# Patient Record
Sex: Male | Born: 1980 | Race: Black or African American | Hispanic: No | Marital: Single | Smoking: Current every day smoker
Health system: Southern US, Community
[De-identification: ages and names within clinical notes are randomized; demographics above are authoritative.]

## PROBLEM LIST (undated history)

## (undated) DIAGNOSIS — E43 Unspecified severe protein-calorie malnutrition: Secondary | ICD-10-CM

## (undated) DIAGNOSIS — F1010 Alcohol abuse, uncomplicated: Secondary | ICD-10-CM

## (undated) DIAGNOSIS — K760 Fatty (change of) liver, not elsewhere classified: Secondary | ICD-10-CM

## (undated) DIAGNOSIS — K5090 Crohn's disease, unspecified, without complications: Secondary | ICD-10-CM

## (undated) DIAGNOSIS — D509 Iron deficiency anemia, unspecified: Secondary | ICD-10-CM

## (undated) DIAGNOSIS — K626 Ulcer of anus and rectum: Secondary | ICD-10-CM

## (undated) DIAGNOSIS — R569 Unspecified convulsions: Secondary | ICD-10-CM

## (undated) DIAGNOSIS — Z720 Tobacco use: Secondary | ICD-10-CM

## (undated) HISTORY — PX: COLOSTOMY REVERSAL: SHX5782

## (undated) HISTORY — PX: PARTIAL COLECTOMY: SHX5273

## (undated) HISTORY — DX: Ulcer of anus and rectum: K62.6

## (undated) HISTORY — DX: Unspecified convulsions: R56.9

## (undated) HISTORY — DX: Unspecified severe protein-calorie malnutrition: E43

## (undated) HISTORY — DX: Alcohol abuse, uncomplicated: F10.10

## (undated) HISTORY — DX: Iron deficiency anemia, unspecified: D50.9

## (undated) HISTORY — PX: HERNIA REPAIR: SHX51

## (undated) HISTORY — PX: COLOSTOMY: SHX63

## (undated) HISTORY — DX: Fatty (change of) liver, not elsewhere classified: K76.0

## (undated) HISTORY — DX: Tobacco use: Z72.0

---

## 2012-05-15 ENCOUNTER — Encounter (HOSPITAL_COMMUNITY): Payer: Self-pay | Admitting: Radiology

## 2012-05-15 ENCOUNTER — Emergency Department (HOSPITAL_COMMUNITY)
Admission: EM | Admit: 2012-05-15 | Discharge: 2012-05-15 | Disposition: A | Discharge status: Home / Self Care | Payer: Self-pay | Attending: Emergency Medicine | Admitting: Emergency Medicine

## 2012-05-15 DIAGNOSIS — R630 Anorexia: Secondary | ICD-10-CM | POA: Insufficient documentation

## 2012-05-15 DIAGNOSIS — R197 Diarrhea, unspecified: Secondary | ICD-10-CM | POA: Insufficient documentation

## 2012-05-15 DIAGNOSIS — K5090 Crohn's disease, unspecified, without complications: Secondary | ICD-10-CM | POA: Insufficient documentation

## 2012-05-15 DIAGNOSIS — F17200 Nicotine dependence, unspecified, uncomplicated: Secondary | ICD-10-CM | POA: Insufficient documentation

## 2012-05-15 DIAGNOSIS — Z760 Encounter for issue of repeat prescription: Secondary | ICD-10-CM | POA: Insufficient documentation

## 2012-05-15 DIAGNOSIS — R110 Nausea: Secondary | ICD-10-CM | POA: Insufficient documentation

## 2012-05-15 DIAGNOSIS — Z9889 Other specified postprocedural states: Secondary | ICD-10-CM | POA: Insufficient documentation

## 2012-05-15 DIAGNOSIS — Z933 Colostomy status: Secondary | ICD-10-CM | POA: Insufficient documentation

## 2012-05-15 LAB — COMPREHENSIVE METABOLIC PANEL
ALT: 9 U/L (ref 0–53)
AST: 17 U/L (ref 0–37)
Albumin: 3.7 g/dL (ref 3.5–5.2)
Alkaline Phosphatase: 108 U/L (ref 39–117)
CO2: 26 mEq/L (ref 19–32)
Chloride: 100 mEq/L (ref 96–112)
Creatinine, Ser: 0.82 mg/dL (ref 0.50–1.35)
GFR calc non Af Amer: >90 mL/min (ref >90)
Potassium: 3.8 mEq/L (ref 3.5–5.1)
Sodium: 136 mEq/L (ref 135–145)
Total Bilirubin: 0.3 mg/dL (ref 0.3–1.2)

## 2012-05-15 LAB — CBC WITH DIFFERENTIAL/PLATELET
Basophils Absolute: 0.1 10*3/uL (ref 0.0–0.1)
Basophils Relative: 1 % (ref 0–1)
HCT: 44.5 % (ref 39.0–52.0)
Hemoglobin: 15.7 g/dL (ref 13.0–17.0)
Lymphocytes Relative: 13 % (ref 12–46)
MCHC: 35.3 g/dL (ref 30.0–36.0)
Monocytes Absolute: 1 10*3/uL (ref 0.1–1.0)
Neutro Abs: 8.1 10*3/uL — ABNORMAL HIGH (ref 1.7–7.7)
Neutrophils Relative %: 76 % (ref 43–77)
RDW: 15.2 % (ref 11.5–15.5)
WBC: 10.7 10*3/uL — ABNORMAL HIGH (ref 4.0–10.5)

## 2012-05-15 MED ORDER — AZATHIOPRINE 50 MG PO TABS: 50.0000 mg | ORAL_TABLET | Freq: Every day | ORAL | Status: DC

## 2012-05-15 MED ORDER — ONDANSETRON HCL 4 MG PO TABS: 4.0000 mg | ORAL_TABLET | Freq: Four times a day (QID) | ORAL | Status: DC

## 2012-05-15 MED ORDER — ONDANSETRON HCL 4 MG/2ML IJ SOLN
4.0000 mg | Freq: Once | INTRAMUSCULAR | Status: AC
  Administered 2012-05-15: 4 mg via INTRAVENOUS
  Filled 2012-05-15: qty 2

## 2012-05-15 MED ORDER — AZATHIOPRINE 50 MG PO TABS
50.0000 mg | ORAL_TABLET | Freq: Once | ORAL | Status: AC
  Administered 2012-05-15: 50 mg via ORAL
  Filled 2012-05-15 (×2): qty 1

## 2012-05-15 MED ORDER — HYDROMORPHONE HCL PF 1 MG/ML IJ SOLN
1.0000 mg | Freq: Once | INTRAMUSCULAR | Status: AC
  Administered 2012-05-15: 1 mg via INTRAVENOUS
  Filled 2012-05-15: qty 1

## 2012-05-15 MED ORDER — SODIUM CHLORIDE 0.9 % IV BOLUS (SEPSIS)
1000.0000 mL | Freq: Once | INTRAVENOUS | Status: AC
  Administered 2012-05-15: 1000 mL via INTRAVENOUS

## 2012-05-15 MED ORDER — OXYCODONE-ACETAMINOPHEN 5-325 MG PO TABS: 2.0000 | ORAL_TABLET | ORAL | Status: DC | PRN

## 2012-05-15 NOTE — Discharge Instructions (Signed)
Crohn's Disease Crohn's disease is a long-term (chronic) soreness and redness (inflammation) of the intestines (bowel). It can affect any portion of the digestive tract, from the mouth to the anus. It can also cause problems outside the digestive tract. Crohn's disease is closely related to a disease called ulcerative colitis (together, these two diseases are called inflammatory bowel disease).  CAUSES  The cause of Crohn's disease is not known. One theory is that, in an easily affected (susceptible) person, the immune system is triggered to attack the body's own digestive tissue. Crohn's disease runs in families. It seems to be more common in certain geographic areas and amongst certain races. There are no clear-cut dietary causes.  SYMPTOMS  Crohn's disease can cause many different symptoms since it can affect many different parts of the body. Symptoms include:  Fatigue.  Weight loss.  Chronic diarrhea, sometime bloody.  Abdominal pain and cramps.  Fever.  Ulcers or canker sores in the mouth or rectum.  Anemia (low red blood cells).  Arthritis, skin problems, and eye problems may occur. Complications of Crohn's disease can include:  Series of holes (perforation) of the bowel.  Portions of the intestines sticking to each other (adhesions).  Obstruction of the bowel.  Fistula formation, typically in the rectal area but also in other areas. A fistula is an opening between the bowels and the outside, or between the bowels and another organ.  A painful crack in the mucous membrane of the anus (rectal fissure). DIAGNOSIS  Your caregiver may suspect Crohn's disease based on your symptoms and an exam. Blood tests may confirm that there is a problem. You may be asked to submit a stool specimen for examination. X-rays and CT scans may be necessary. Ultimately, the diagnosis is usually made after a procedure that uses a flexible tube that is inserted via your mouth or your anus. This is done  under sedation and is called either an upper endoscopy or colonoscopy. With these tests, the specialist can take tiny tissue samples and remove them from the inside of the bowel (biopsy). Examination of this biopsy tissue under a microscope can reveal Crohn's disease as the cause of your symptoms. Due to the many different forms that Crohn's disease can take, symptoms may be present for several years before a diagnosis is made. HOME CARE INSTRUCTIONS   There is no cure for Crohn's disease. The best treatment is frequent checkups with your caregiver.  Symptoms such as diarrhea can be controlled with medications. Avoid foods that have a laxative effect such as fresh fruit, vegetables and dairy products. During flare ups, you can rest your bowel by refraining from solid foods. Drink clear liquids frequently during the day (electrolyte or re-hydrating fluids are best. Your caregiver can help you with suggestions). Drink often to prevent loss of body fluids (dehydration). When diarrhea has cleared, eat small meals and more frequently. Avoid food additives and stimulants such as caffeine (coffee, tea, or chocolate). Enzyme supplements may help if you develop intolerance to a sugar in dairy products (lactose). Ask your caregiver or dietitian about specific dietary instructions.  Try to maintain a positive attitude. Learn relaxation techniques such as self hypnosis, mental imaging, and muscle relaxation.  If possible, avoid stresses which can aggravate your condition.  Exercise regularly.  Follow your diet.  Always get plenty of rest. SEEK MEDICAL CARE IF:   Your symptoms fail to improve after a week or two of new treatment.  You experience continued weight loss.  You have   ongoing crampy digestion or loose bowels.  You develop a new skin rash, skin sores, or eye problems. SEEK IMMEDIATE MEDICAL CARE IF:   You have worsening of your symptoms or develop new symptoms.  You have a fever.  You  develop bloody diarrhea.  You develop severe abdominal pain. MAKE SURE YOU:   Understand these instructions.  Will watch your condition.  Will get help right away if you are not doing well or get worse. Document Released: 12/12/2004 Document Revised: 05/27/2011 Document Reviewed: 11/10/2006 Exit[employer] Patient Information 2013 [employer].  

## 2012-05-15 NOTE — ED Notes (Signed)
Requested imuran from pharmacy at this time.

## 2012-05-15 NOTE — ED Provider Notes (Addendum)
History     CSN: [id number]  Arrival date & time 05/15/12  4098   First MD Initiated Contact with Patient 05/15/12 0825      Chief Complaint  Patient presents with  . Medication Refill    (Consider location/radiation/quality/duration/timing/severity/associated sxs/prior treatment) Patient is a 32 y.o. male presenting with abdominal pain. The history is provided by the patient.  Abdominal Pain Pain location: right abd. Pain quality: aching, sharp, squeezing and throbbing   Pain radiates to:  Does not radiate Pain severity:  Moderate Onset quality:  Gradual Duration:  3 days Timing:  Constant Progression:  Worsening Chronicity:  Recurrent Context: eating and previous surgery   Context: not retching, not sick contacts and not trauma   Context comment:  Has crohn's and ran out of his imuran on tues.  pain started on wed Relieved by:  Nothing Worsened by:  Palpation and eating Ineffective treatments:  None tried Associated symptoms: anorexia, diarrhea and nausea   Associated symptoms: no chest pain, no cough, no dysuria, no fever, no hematochezia, no melena, no shortness of breath and no vomiting   Risk factors: multiple surgeries   Risk factors: no alcohol abuse and no NSAID use     No past medical history on file.  No past surgical history on file.  No family history on file.  History  Substance Use Topics  . Smoking status: Not on file  . Smokeless tobacco: Not on file  . Alcohol Use: Not on file      Review of Systems  Constitutional: Negative for fever.  Respiratory: Negative for cough and shortness of breath.   Cardiovascular: Negative for chest pain.  Gastrointestinal: Positive for nausea, abdominal pain, diarrhea and anorexia. Negative for vomiting, melena and hematochezia.  Genitourinary: Negative for dysuria.  All other systems reviewed and are negative.    Allergies  Ibuprofen  Home Medications   Current Outpatient Rx  Name  Route  Sig   Dispense  Refill  . azaTHIOprine (IMURAN) 50 MG tablet   Oral   Take 50 mg by mouth daily.           There were no vitals taken for this visit.  Physical Exam  Nursing note and vitals reviewed. Constitutional: He is oriented to person, place, and time. He appears well-developed and well-nourished. No distress.  HENT:  Head: Normocephalic and atraumatic.  Mouth/Throat: Oropharynx is clear and moist.  Eyes: Conjunctivae and EOM are normal. Pupils are equal, round, and reactive to light.  Neck: Normal range of motion. Neck supple.  Cardiovascular: Normal rate, regular rhythm and intact distal pulses.   No murmur heard. Pulmonary/Chest: Effort normal and breath sounds normal. No respiratory distress. He has no wheezes. He has no rales.  Abdominal: Soft. He exhibits no distension. There is tenderness in the right upper quadrant, right lower quadrant and periumbilical area. There is no rebound, no guarding and no CVA tenderness.  Numerous well healed surgical scars  Musculoskeletal: Normal range of motion. He exhibits no edema and no tenderness.  Neurological: He is alert and oriented to person, place, and time.  Skin: Skin is warm and dry. No rash noted. No erythema.  Psychiatric: He has a normal mood and affect. His behavior is normal.    ED Course  Procedures (including critical care time)  Labs Reviewed  CBC WITH DIFFERENTIAL - Abnormal; Notable for the following:    WBC 10.7 (*)    Platelets 466 (*)    Neutro Abs 8.1 (*)  All other components within normal limits  COMPREHENSIVE METABOLIC PANEL  LIPASE, BLOOD   No results found.   1. Crohn's disease       MDM    Pt with hx of crohn's off his imuran since tues (3 days ago) due to not being able to find a PCP here and just got out of jail.  Now states starting to have a crohn's flair.  Pain in the right abd per his norm and non-bloody diarrhea.  Nausea but no vomiting.  VS stable.  No peritoneal signs. Will give  dose of his meds and refer to PCP.  Also will check labs and give fluids/antiemetics as pt is requesting and does not think he can take po's currently. CBC, CMP, lipase pending.  10:57 AM LAbs wnl.  Pt improved after 1 dose of meds.  Give ppx for imuran, zofran and percocet      Gwyneth Sprout, MD 05/15/12 1058  Gwyneth Sprout, MD 05/15/12 1100

## 2012-05-15 NOTE — ED Notes (Signed)
Pt presents for prescription refill

## 2012-06-17 ENCOUNTER — Emergency Department (HOSPITAL_COMMUNITY)
Admission: EM | Admit: 2012-06-17 | Discharge: 2012-06-17 | Disposition: A | Discharge status: Home / Self Care | Payer: Self-pay | Attending: Emergency Medicine | Admitting: Emergency Medicine

## 2012-06-17 ENCOUNTER — Encounter (HOSPITAL_COMMUNITY): Payer: Self-pay | Admitting: *Deleted

## 2012-06-17 DIAGNOSIS — K117 Disturbances of salivary secretion: Secondary | ICD-10-CM | POA: Insufficient documentation

## 2012-06-17 DIAGNOSIS — K5090 Crohn's disease, unspecified, without complications: Secondary | ICD-10-CM | POA: Insufficient documentation

## 2012-06-17 DIAGNOSIS — F17200 Nicotine dependence, unspecified, uncomplicated: Secondary | ICD-10-CM | POA: Insufficient documentation

## 2012-06-17 DIAGNOSIS — R197 Diarrhea, unspecified: Secondary | ICD-10-CM | POA: Insufficient documentation

## 2012-06-17 DIAGNOSIS — Z79899 Other long term (current) drug therapy: Secondary | ICD-10-CM | POA: Insufficient documentation

## 2012-06-17 DIAGNOSIS — E860 Dehydration: Secondary | ICD-10-CM | POA: Insufficient documentation

## 2012-06-17 HISTORY — DX: Crohn's disease, unspecified, without complications: K50.90

## 2012-06-17 LAB — URINALYSIS, MICROSCOPIC ONLY
Glucose, UA: NEGATIVE mg/dL
Leukocytes, UA: NEGATIVE
Protein, ur: NEGATIVE mg/dL
Specific Gravity, Urine: 1.006 (ref 1.005–1.030)
Urobilinogen, UA: 0.2 mg/dL (ref 0.0–1.0)

## 2012-06-17 LAB — CBC WITH DIFFERENTIAL/PLATELET
Basophils Absolute: 0.1 10*3/uL (ref 0.0–0.1)
Eosinophils Absolute: 0.1 10*3/uL (ref 0.0–0.7)
Eosinophils Relative: 0 % (ref 0–5)
HCT: 46 % (ref 39.0–52.0)
Lymphocytes Relative: 13 % (ref 12–46)
MCH: 31 pg (ref 26.0–34.0)
MCHC: 35.2 g/dL (ref 30.0–36.0)
MCV: 88 fL (ref 78.0–100.0)
Monocytes Absolute: 1.3 10*3/uL — ABNORMAL HIGH (ref 0.1–1.0)
Platelets: 466 10*3/uL — ABNORMAL HIGH (ref 150–400)
RDW: 14.2 % (ref 11.5–15.5)
WBC: 13.9 10*3/uL — ABNORMAL HIGH (ref 4.0–10.5)

## 2012-06-17 LAB — COMPREHENSIVE METABOLIC PANEL
AST: 21 U/L (ref 0–37)
CO2: 26 mEq/L (ref 19–32)
Calcium: 9.3 mg/dL (ref 8.4–10.5)
Creatinine, Ser: 0.84 mg/dL (ref 0.50–1.35)
GFR calc Af Amer: >90 mL/min (ref >90)
GFR calc non Af Amer: >90 mL/min (ref >90)
Sodium: 133 mEq/L — ABNORMAL LOW (ref 135–145)
Total Protein: 7.9 g/dL (ref 6.0–8.3)

## 2012-06-17 MED ORDER — HYDROMORPHONE HCL PF 1 MG/ML IJ SOLN
1.0000 mg | Freq: Once | INTRAMUSCULAR | Status: AC
Start: 2012-06-17 — End: 2012-06-17
  Administered 2012-06-17: 1 mg via INTRAVENOUS
  Filled 2012-06-17: qty 1

## 2012-06-17 MED ORDER — ONDANSETRON HCL 4 MG/2ML IJ SOLN
4.0000 mg | Freq: Once | INTRAMUSCULAR | Status: AC
  Administered 2012-06-17: 4 mg via INTRAVENOUS
  Filled 2012-06-17: qty 2

## 2012-06-17 MED ORDER — SODIUM CHLORIDE 0.9 % IV BOLUS (SEPSIS)
1000.0000 mL | Freq: Once | INTRAVENOUS | Status: AC
  Administered 2012-06-17: 1000 mL via INTRAVENOUS

## 2012-06-17 MED ORDER — OXYCODONE-ACETAMINOPHEN 5-325 MG PO TABS: 1.0000 | ORAL_TABLET | Freq: Four times a day (QID) | ORAL | Status: DC | PRN

## 2012-06-17 MED ORDER — ONDANSETRON HCL 4 MG PO TABS: 4.0000 mg | ORAL_TABLET | Freq: Three times a day (TID) | ORAL | Status: DC | PRN

## 2012-06-17 NOTE — ED Provider Notes (Signed)
History     CSN: [id number]  Arrival date & time 06/17/12  1103   First MD Initiated Contact with Patient 06/17/12 1237      Chief Complaint  Patient presents with  . Abdominal Pain    (Consider location/radiation/quality/duration/timing/severity/associated sxs/prior treatment) Patient is a 32 y.o. male presenting with abdominal pain.  Abdominal Pain  Pt with history of crohn's disease with numerous previous surgeries, previously living in the [place] area has relocated here after getting out of jail recently. Is scheduled for GI followup in June. Reports moderate to severe generalized abdominal pain with watery diarrhea for the last 2 days. No fever and no vomiting. Feels generally weak, dry mouth, dehydrated.   Past Medical History  Diagnosis Date  . Crohn's disease     Past Surgical History  Procedure Laterality Date  . Hernia repair    . Colostomy    . Abdominal surgery      History reviewed. No pertinent family history.  History  Substance Use Topics  . Smoking status: Current Every Day Smoker  . Smokeless tobacco: Not on file  . Alcohol Use: No      Review of Systems  Gastrointestinal: Positive for abdominal pain.   All other systems reviewed and are negative except as noted in HPI.   Allergies  Ibuprofen  Home Medications   Current Outpatient Rx  Name  Route  Sig  Dispense  Refill  . azaTHIOprine (IMURAN) 50 MG tablet   Oral   Take 50 mg by mouth daily.         . ondansetron (ZOFRAN) 4 MG tablet   Oral   Take 1 tablet (4 mg total) by mouth every 6 (six) hours.   12 tablet   0   . oxyCODONE-acetaminophen (PERCOCET/ROXICET) 5-325 MG per tablet   Oral   Take 2 tablets by mouth every 4 (four) hours as needed for pain.   15 tablet   0     BP 126/76  Pulse 96  Temp(Src) 98.7 F (37.1 C) (Oral)  Resp 20  SpO2 99%  Physical Exam  Nursing note and vitals reviewed. Constitutional: He is oriented to person, place, and time. He appears  well-developed and well-nourished.  HENT:  Head: Normocephalic and atraumatic.  Eyes: EOM are normal. Pupils are equal, round, and reactive to light.  Neck: Normal range of motion. Neck supple.  Cardiovascular: Normal rate, normal heart sounds and intact distal pulses.   Pulmonary/Chest: Effort normal and breath sounds normal.  Abdominal: Bowel sounds are normal. He exhibits no distension. There is tenderness (diffuse, moderate). There is no rebound and no guarding.  Numerous surgical scars  Musculoskeletal: Normal range of motion. He exhibits no edema and no tenderness.  Neurological: He is alert and oriented to person, place, and time. He has normal strength. No cranial nerve deficit or sensory deficit.  Skin: Skin is warm and dry. No rash noted.  Psychiatric: He has a normal mood and affect.    ED Course  Procedures (including critical care time)  Labs Reviewed  CBC WITH DIFFERENTIAL - Abnormal; Notable for the following:    WBC 13.9 (*)    Platelets 466 (*)    Neutro Abs 10.6 (*)    Monocytes Absolute 1.3 (*)    All other components within normal limits  COMPREHENSIVE METABOLIC PANEL - Abnormal; Notable for the following:    Sodium 133 (*)    Alkaline Phosphatase 132 (*)    All other components within normal  limits  LIPASE, BLOOD  URINALYSIS, MICROSCOPIC ONLY   No results found.   1. Crohn's disease       MDM  Pt feeling better, labd unremarkable. Advised to follow up with his GI doctor (he can't remember the name).         Charles B. Bernette Mayers, MD 06/17/12 1345

## 2012-06-17 NOTE — ED Notes (Signed)
Pt reports having right side abd pain since Monday, some diarrhea yesterday, denies n/v. Hx of crohns, pt reports having generalized fatigue. No acute distress noted at triage.

## 2013-07-14 ENCOUNTER — Emergency Department (HOSPITAL_COMMUNITY)
Admission: EM | Admit: 2013-07-14 | Discharge: 2013-07-14 | Disposition: A | Discharge status: Home / Self Care | Payer: Self-pay | Attending: Emergency Medicine | Admitting: Emergency Medicine

## 2013-07-14 ENCOUNTER — Encounter (HOSPITAL_COMMUNITY): Payer: Self-pay | Admitting: Emergency Medicine

## 2013-07-14 ENCOUNTER — Emergency Department (HOSPITAL_COMMUNITY): Payer: Self-pay

## 2013-07-14 DIAGNOSIS — R112 Nausea with vomiting, unspecified: Secondary | ICD-10-CM | POA: Insufficient documentation

## 2013-07-14 DIAGNOSIS — Z79899 Other long term (current) drug therapy: Secondary | ICD-10-CM | POA: Insufficient documentation

## 2013-07-14 DIAGNOSIS — F17200 Nicotine dependence, unspecified, uncomplicated: Secondary | ICD-10-CM | POA: Insufficient documentation

## 2013-07-14 DIAGNOSIS — K5090 Crohn's disease, unspecified, without complications: Secondary | ICD-10-CM | POA: Insufficient documentation

## 2013-07-14 DIAGNOSIS — R630 Anorexia: Secondary | ICD-10-CM | POA: Insufficient documentation

## 2013-07-14 DIAGNOSIS — Z9889 Other specified postprocedural states: Secondary | ICD-10-CM | POA: Insufficient documentation

## 2013-07-14 DIAGNOSIS — R109 Unspecified abdominal pain: Secondary | ICD-10-CM

## 2013-07-14 DIAGNOSIS — Z933 Colostomy status: Secondary | ICD-10-CM | POA: Insufficient documentation

## 2013-07-14 DIAGNOSIS — R1084 Generalized abdominal pain: Secondary | ICD-10-CM | POA: Insufficient documentation

## 2013-07-14 DIAGNOSIS — R197 Diarrhea, unspecified: Secondary | ICD-10-CM | POA: Insufficient documentation

## 2013-07-14 DIAGNOSIS — R7989 Other specified abnormal findings of blood chemistry: Secondary | ICD-10-CM | POA: Insufficient documentation

## 2013-07-14 DIAGNOSIS — R000 Tachycardia, unspecified: Secondary | ICD-10-CM | POA: Insufficient documentation

## 2013-07-14 LAB — CBC WITH DIFFERENTIAL/PLATELET
Basophils Absolute: 0.1 10*3/uL (ref 0.0–0.1)
Basophils Relative: 1 % (ref 0–1)
Eosinophils Absolute: 0 10*3/uL (ref 0.0–0.7)
Eosinophils Relative: 0 % (ref 0–5)
HCT: 33.2 % — ABNORMAL LOW (ref 39.0–52.0)
Hemoglobin: 10.9 g/dL — ABNORMAL LOW (ref 13.0–17.0)
Lymphocytes Relative: 11 % — ABNORMAL LOW (ref 12–46)
Lymphs Abs: 1.5 10*3/uL (ref 0.7–4.0)
MCH: 26.3 pg (ref 26.0–34.0)
MCHC: 32.8 g/dL (ref 30.0–36.0)
MCV: 80 fL (ref 78.0–100.0)
Monocytes Absolute: 1.2 10*3/uL — ABNORMAL HIGH (ref 0.1–1.0)
Monocytes Relative: 8 % (ref 3–12)
Neutro Abs: 11.9 10*3/uL — ABNORMAL HIGH (ref 1.7–7.7)
Neutrophils Relative %: 80 % — ABNORMAL HIGH (ref 43–77)
Platelets: 550 10*3/uL — ABNORMAL HIGH (ref 150–400)
RBC: 4.15 MIL/uL — ABNORMAL LOW (ref 4.22–5.81)
RDW: 14.8 % (ref 11.5–15.5)
WBC: 14.7 10*3/uL — ABNORMAL HIGH (ref 4.0–10.5)

## 2013-07-14 LAB — COMPREHENSIVE METABOLIC PANEL
ALT: 128 U/L — ABNORMAL HIGH (ref 0–53)
AST: 123 U/L — ABNORMAL HIGH (ref 0–37)
Albumin: 3.5 g/dL (ref 3.5–5.2)
Alkaline Phosphatase: 126 U/L — ABNORMAL HIGH (ref 39–117)
BUN: 5 mg/dL — ABNORMAL LOW (ref 6–23)
CO2: 22 mEq/L (ref 19–32)
Calcium: 9.5 mg/dL (ref 8.4–10.5)
Chloride: 89 mEq/L — ABNORMAL LOW (ref 96–112)
Creatinine, Ser: 0.68 mg/dL (ref 0.50–1.35)
GFR calc Af Amer: >90 mL/min (ref >90)
GFR calc non Af Amer: >90 mL/min (ref >90)
Glucose, Bld: 178 mg/dL — ABNORMAL HIGH (ref 70–99)
Potassium: 3.3 mEq/L — ABNORMAL LOW (ref 3.7–5.3)
Sodium: 131 mEq/L — ABNORMAL LOW (ref 137–147)
Total Bilirubin: 0.3 mg/dL (ref 0.3–1.2)
Total Protein: 7.9 g/dL (ref 6.0–8.3)

## 2013-07-14 LAB — LIPASE, BLOOD: Lipase: 28 U/L (ref 11–59)

## 2013-07-14 MED ORDER — IOHEXOL 300 MG/ML  SOLN
25.0000 mL | INTRAMUSCULAR | Status: AC
Start: 2013-07-14 — End: 2013-07-14
  Administered 2013-07-14: 25 mL via ORAL

## 2013-07-14 MED ORDER — ONDANSETRON HCL 4 MG/2ML IJ SOLN
4.0000 mg | Freq: Once | INTRAMUSCULAR | Status: AC
  Administered 2013-07-14: 4 mg via INTRAVENOUS
  Filled 2013-07-14: qty 2

## 2013-07-14 MED ORDER — IOHEXOL 300 MG/ML  SOLN
25.0000 mL | Freq: Once | INTRAMUSCULAR | Status: DC | PRN
Start: 2013-07-14 — End: 2013-07-14

## 2013-07-14 MED ORDER — PROMETHAZINE HCL 25 MG PO TABS: 25.0000 mg | ORAL_TABLET | Freq: Four times a day (QID) | ORAL | Status: DC | PRN

## 2013-07-14 MED ORDER — OXYCODONE-ACETAMINOPHEN 5-325 MG PO TABS: 1.0000 | ORAL_TABLET | ORAL | Status: DC | PRN

## 2013-07-14 MED ORDER — IOHEXOL 300 MG/ML  SOLN
80.0000 mL | Freq: Once | INTRAMUSCULAR | Status: AC | PRN
  Administered 2013-07-14: 80 mL via INTRAVENOUS

## 2013-07-14 MED ORDER — PROMETHAZINE HCL 25 MG/ML IJ SOLN
25.0000 mg | Freq: Once | INTRAMUSCULAR | Status: AC
  Administered 2013-07-14: 25 mg via INTRAVENOUS
  Filled 2013-07-14: qty 1

## 2013-07-14 MED ORDER — HYDROMORPHONE HCL PF 1 MG/ML IJ SOLN
1.0000 mg | Freq: Once | INTRAMUSCULAR | Status: AC
  Administered 2013-07-14: 1 mg via INTRAVENOUS
  Filled 2013-07-14: qty 1

## 2013-07-14 MED ORDER — PREDNISONE 20 MG PO TABS: 40.0000 mg | ORAL_TABLET | Freq: Every day | ORAL | Status: DC

## 2013-07-14 MED ORDER — SODIUM CHLORIDE 0.9 % IV BOLUS (SEPSIS)
1000.0000 mL | INTRAVENOUS | Status: AC
  Administered 2013-07-14: 1000 mL via INTRAVENOUS

## 2013-07-14 NOTE — ED Notes (Signed)
Pt informed of plan of care. States he can not stay related to lack of child care.

## 2013-07-14 NOTE — ED Notes (Signed)
Pt presents to department for evaluation of diffuse abdominal pain. Ongoing for several days. 9/10 pain at the time. Denies urinary symptoms. Pt is alert and oriented x4. History of Crohn's Disease.

## 2013-07-14 NOTE — ED Notes (Signed)
CT notified pt finished contrast  

## 2013-07-14 NOTE — ED Provider Notes (Signed)
CSN: [id number]     Arrival date & time 07/14/13  0845 History   First MD Initiated Contact with Patient 07/14/13 [phone]     Chief Complaint  Patient presents with  . Abdominal Pain     (Consider location/radiation/quality/duration/timing/severity/associated sxs/prior Treatment) HPI Pt is a 33yo male with hx of Crohn's disease c/o abdominal pain associated with n/v/d. Reports diffuse abdominal pain for 3-4 days, pain is 9/10 at worst. States he took Nyquil last night to help him sleep. Reports 1 episode of NBNB emesis today, and 1 episode of watery diarrhea w/o blood or mucous 2 days ago.  Denies urinary symptoms. Denies sick contacts or recent travel. Reports multiple abdominal surgeries including hernia repair and colostomy, denies hx of SBO. States he is suppose to take Imuran for Crohns but has not taken for several months due to lack of money.  Pt has GI specialist in [place].   Past Medical History  Diagnosis Date  . Crohn's disease    Past Surgical History  Procedure Laterality Date  . Hernia repair    . Colostomy    . Abdominal surgery     History reviewed. No pertinent family history. History  Substance Use Topics  . Smoking status: Current Every Day Smoker    Types: Cigarettes  . Smokeless tobacco: Not on file  . Alcohol Use: Yes     Comment: social    Review of Systems  Constitutional: Positive for appetite change. Negative for fever, chills, diaphoresis and fatigue.  Respiratory: Negative for shortness of breath.   Cardiovascular: Negative for chest pain and palpitations.  Gastrointestinal: Positive for nausea, vomiting, abdominal pain ( generalized) and diarrhea. Negative for constipation.  Genitourinary: Negative for dysuria, hematuria and flank pain.  Musculoskeletal: Negative for back pain.  All other systems reviewed and are negative.     Allergies  Ibuprofen and Tramadol  Home Medications   Prior to Admission medications   Medication Sig Start Date  End Date Taking? Authorizing Provider  azaTHIOprine (IMURAN) 50 MG tablet Take 50 mg by mouth daily.   Yes Historical Provider, MD   BP 119/74  Pulse 79  Temp(Src) 98.8 F (37.1 C) (Oral)  Resp 18  Ht 5\' 3"  (1.6 m)  Wt 125 lb (56.7 kg)  BMI 22.15 kg/m2  SpO2 97% Physical Exam  Nursing note and vitals reviewed. Constitutional: He appears well-developed and well-nourished.  Pt lying in exam bed on right side, holding abdomen, appears uncomfortable.  HENT:  Head: Normocephalic and atraumatic.  Eyes: Conjunctivae are normal. No scleral icterus.  Neck: Normal range of motion.  Cardiovascular: Regular rhythm and normal heart sounds.  Tachycardia present.   Pulmonary/Chest: Effort normal and breath sounds normal. No respiratory distress. He has no wheezes. He has no rales. He exhibits no tenderness.  No respiratory distress, able to speak in full sentences w/o difficulty. Lungs: CTAB  Abdominal: Soft. Bowel sounds are normal. He exhibits no distension and no mass. There is tenderness. There is no rebound and no guarding.  Multiple well healed surgical scars. Soft, non-distended. Generalized tenderness. No rebound, guarding or masses  Musculoskeletal: Normal range of motion.  Neurological: He is alert.  Skin: Skin is warm and dry.    ED Course  Procedures (including critical care time) Labs Review Labs Reviewed  CBC WITH DIFFERENTIAL - Abnormal; Notable for the following:    WBC 14.7 (*)    RBC 4.15 (*)    Hemoglobin 10.9 (*)    HCT 33.2 (*)  Platelets 550 (*)    Neutrophils Relative % 80 (*)    Neutro Abs 11.9 (*)    Lymphocytes Relative 11 (*)    Monocytes Absolute 1.2 (*)    All other components within normal limits  COMPREHENSIVE METABOLIC PANEL - Abnormal; Notable for the following:    Sodium 131 (*)    Potassium 3.3 (*)    Chloride 89 (*)    Glucose, Bld 178 (*)    BUN 5 (*)    AST 123 (*)    ALT 128 (*)    Alkaline Phosphatase 126 (*)    All other components  within normal limits  LIPASE, BLOOD    Imaging Review Ct Abdomen Pelvis W Contrast  07/14/2013   CLINICAL DATA:  Abdominal pain, nausea, vomiting. History of Crohn's disease. Elevated LFTs.  EXAM: CT ABDOMEN AND PELVIS WITH CONTRAST  TECHNIQUE: Multidetector CT imaging of the abdomen and pelvis was performed using the standard protocol following bolus administration of intravenous contrast.  CONTRAST:  27mL OMNIPAQUE IOHEXOL 300 MG/ML  SOLN  COMPARISON:  None.  FINDINGS: Lung bases are clear.  No effusions.  Heart is normal size.  Diffuse fatty infiltration of the liver. No visible focal abnormality. No biliary ductal dilatation. Gallbladder, spleen, pancreas, adrenals and kidneys are unremarkable.  There is circumferential wall thickening within the rectum and anal regions. This is presumably related to inflammatory bowel disease. Prior subtotal colectomy. Small bowel is unremarkable. No free fluid, free air or adenopathy. Urinary bladder is unremarkable.  Prominent perirectal fat, likely creeping fat related to inflammatory bowel disease. There are small perirectal lymph nodes in the area of inflammation/wall thickening.  No other adenopathy.  No free fluid or free air.  Aorta is normal caliber.  No acute bony abnormality.  IMPRESSION: Prior subtotal colectomy. 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  Diffuse fatty infiltration of the liver.   Electronically Signed   By: Rolm Baptise M.D.   On: 07/14/2013 14:10     EKG Interpretation None      MDM   Final diagnoses:  Abdominal pain  Nausea & vomiting    Pt with hx of Chron's c/o abdominal pain n/v/d.  Pt reports not taking prescribed Imuran, unable to afford.  On exam pt appears uncomfortable. Pain and nausea initially improved with 1mg  IV dilaudid, IV fluids and zofran.   10:29 AM Elevated LFTs, no hx of previous. No hx of abdominal imaging in system, will get CT abd/pelvis.  Pain  worsening after initial dose of pain medication, second dose of dilaudid given.  CT abd: prior subtotal colectomy. Wall thickening w/n rectum and anal regions, likely recurrent inflammatory bowel disease.  Diffuse fatty infiltration of liver.  Discussed pt with Dr. Alvino Chapel.  Plan is to admit pt, however pt declined, stating he had to pick up his children from school. States he plans to return to ER if PO home medications do not help with symptoms. Consulted with Dr. Collene Mares, Gastroenterology, will start pt on prednisone taper.  Will give percocet and phenergan for symptomatic tx.  Strict return precautions given. Pt verbalized understanding and agreement with tx plan.    Noland Fordyce, PA-C 07/14/13 1531

## 2013-07-14 NOTE — Progress Notes (Signed)
  CARE MANAGEMENT ED NOTE 07/14/2013  Patient:  Benjamin Shelton, Benjamin Shelton   Account Number:  [id number]  Date Initiated:  07/14/2013  Documentation initiated by:  Edwyna Shell  Subjective/Objective Assessment:   33yo male presenting to the [facility] ED with abdominal pain related to Crohn's disease.     Subjective/Objective Assessment Detail:   Pt is a 33yo male with hx of Crohn's disease c/o abdominal pain associated with n/v/d. Reports diffuse abdominal pain for 3-4 days, pain is 9/10 at worst. States he took Nyquil last night to help him sleep. Reports 1 episode of NBNB emesis today, and 1 episode of watery diarrhea w/o blood or mucous 2 days ago.  Denies urinary symptoms. Denies sick contacts or recent travel. Reports multiple abdominal surgeries including hernia repair and colostomy, denies hx of SBO. States he is suppose to take Imuran for Crohns but has not taken for several months due to lack of money.  Pt has GI specialist in [place].     Action/Plan:   Action/Plan Detail:   Anticipated DC Date:       Status Recommendation to Physician:   Result of Recommendation:      DC Planning Services  CM consult  Other  PCP issues  Medication Assistance    Choice offered to / List presented to:  C-1 Patient          Status of service:    ED Comments:   ED Comments Detail:  CM spoke with patient regarding ED visit, discussed medication and affordability. Provided patient with a good Rx prescription card to assist with refills of the Imuran. Also provided the patient with a list of [facility] resources and discussed the importance of a PCP especially with a chronic disease process such as Crohn's disease. Patient also spoke with [employer] rep, [name], to be set up with an orange card. Patient had no questions or concerns and was appreciative of services.

## 2013-07-14 NOTE — ED Notes (Signed)
Pt alert x4. States will return if pain returns and is able to obtain child care

## 2013-07-14 NOTE — Discharge Planning (Signed)
M6NO [name] E, Community Liaison  Spoke to patient about primary care resources and establishing care with a provider. Resource guide and my contact information was provided for any future questions or concerns.

## 2013-07-14 NOTE — ED Notes (Signed)
Called for patient. He has gone outside to make a phone call.

## 2013-07-14 NOTE — ED Notes (Signed)
MD at bedside. 

## 2013-07-16 NOTE — ED Provider Notes (Signed)
Medical screening examination/treatment/procedure(s) were performed by non-physician practitioner and as supervising physician I was immediately available for consultation/collaboration.   EKG Interpretation None       Chia Mowers R. Keena Dinse, MD 07/16/13 0656 

## 2013-07-29 ENCOUNTER — Inpatient Hospital Stay (HOSPITAL_COMMUNITY)
Admission: EM | Admit: 2013-07-29 | Discharge: 2013-08-03 | DRG: 386 | Disposition: A | Discharge status: Home / Self Care | Payer: Self-pay | Attending: Internal Medicine | Admitting: Internal Medicine

## 2013-07-29 ENCOUNTER — Encounter (HOSPITAL_COMMUNITY): Payer: Self-pay | Admitting: Emergency Medicine

## 2013-07-29 ENCOUNTER — Emergency Department (HOSPITAL_COMMUNITY): Payer: Self-pay

## 2013-07-29 DIAGNOSIS — E861 Hypovolemia: Secondary | ICD-10-CM | POA: Diagnosis present

## 2013-07-29 DIAGNOSIS — R748 Abnormal levels of other serum enzymes: Secondary | ICD-10-CM

## 2013-07-29 DIAGNOSIS — D649 Anemia, unspecified: Secondary | ICD-10-CM

## 2013-07-29 DIAGNOSIS — K529 Noninfective gastroenteritis and colitis, unspecified: Secondary | ICD-10-CM | POA: Diagnosis present

## 2013-07-29 DIAGNOSIS — Z9049 Acquired absence of other specified parts of digestive tract: Secondary | ICD-10-CM

## 2013-07-29 DIAGNOSIS — R109 Unspecified abdominal pain: Secondary | ICD-10-CM

## 2013-07-29 DIAGNOSIS — F17200 Nicotine dependence, unspecified, uncomplicated: Secondary | ICD-10-CM | POA: Diagnosis present

## 2013-07-29 DIAGNOSIS — K5090 Crohn's disease, unspecified, without complications: Principal | ICD-10-CM | POA: Diagnosis present

## 2013-07-29 DIAGNOSIS — E440 Moderate protein-calorie malnutrition: Secondary | ICD-10-CM | POA: Diagnosis present

## 2013-07-29 DIAGNOSIS — R7309 Other abnormal glucose: Secondary | ICD-10-CM | POA: Diagnosis present

## 2013-07-29 DIAGNOSIS — D509 Iron deficiency anemia, unspecified: Secondary | ICD-10-CM

## 2013-07-29 DIAGNOSIS — D72829 Elevated white blood cell count, unspecified: Secondary | ICD-10-CM | POA: Diagnosis present

## 2013-07-29 DIAGNOSIS — E876 Hypokalemia: Secondary | ICD-10-CM | POA: Diagnosis present

## 2013-07-29 DIAGNOSIS — R112 Nausea with vomiting, unspecified: Secondary | ICD-10-CM

## 2013-07-29 DIAGNOSIS — Z79899 Other long term (current) drug therapy: Secondary | ICD-10-CM

## 2013-07-29 DIAGNOSIS — K589 Irritable bowel syndrome without diarrhea: Secondary | ICD-10-CM | POA: Diagnosis present

## 2013-07-29 DIAGNOSIS — IMO0002 Reserved for concepts with insufficient information to code with codable children: Secondary | ICD-10-CM

## 2013-07-29 DIAGNOSIS — E871 Hypo-osmolality and hyponatremia: Secondary | ICD-10-CM | POA: Diagnosis present

## 2013-07-29 LAB — COMPREHENSIVE METABOLIC PANEL
ALK PHOS: 125 U/L — AB (ref 39–117)
ALT: 148 U/L — ABNORMAL HIGH (ref 0–53)
AST: 143 U/L — ABNORMAL HIGH (ref 0–37)
Albumin: 3.5 g/dL (ref 3.5–5.2)
BILIRUBIN TOTAL: 0.4 mg/dL (ref 0.3–1.2)
BUN: 7 mg/dL (ref 6–23)
CHLORIDE: 89 meq/L — AB (ref 96–112)
CO2: 20 mEq/L (ref 19–32)
Calcium: 9.5 mg/dL (ref 8.4–10.5)
Creatinine, Ser: 0.69 mg/dL (ref 0.50–1.35)
GFR calc Af Amer: >90 mL/min (ref >90)
GFR calc non Af Amer: >90 mL/min (ref >90)
Glucose, Bld: 144 mg/dL — ABNORMAL HIGH (ref 70–99)
Potassium: 3.6 mEq/L — ABNORMAL LOW (ref 3.7–5.3)
Sodium: 131 mEq/L — ABNORMAL LOW (ref 137–147)
Total Protein: 7.6 g/dL (ref 6.0–8.3)

## 2013-07-29 LAB — CBC WITH DIFFERENTIAL/PLATELET
BASOS ABS: 0.1 10*3/uL (ref 0.0–0.1)
BASOS PCT: 1 % (ref 0–1)
EOS ABS: 0.1 10*3/uL (ref 0.0–0.7)
EOS PCT: 1 % (ref 0–5)
HCT: 31.3 % — ABNORMAL LOW (ref 39.0–52.0)
Hemoglobin: 10.1 g/dL — ABNORMAL LOW (ref 13.0–17.0)
Lymphocytes Relative: 10 % — ABNORMAL LOW (ref 12–46)
Lymphs Abs: 1.2 10*3/uL (ref 0.7–4.0)
MCH: 24.7 pg — ABNORMAL LOW (ref 26.0–34.0)
MCHC: 32.3 g/dL (ref 30.0–36.0)
MCV: 76.5 fL — AB (ref 78.0–100.0)
Monocytes Absolute: 1.3 10*3/uL — ABNORMAL HIGH (ref 0.1–1.0)
Monocytes Relative: 10 % (ref 3–12)
Neutro Abs: 10 10*3/uL — ABNORMAL HIGH (ref 1.7–7.7)
Neutrophils Relative %: 78 % — ABNORMAL HIGH (ref 43–77)
PLATELETS: 485 10*3/uL — AB (ref 150–400)
RBC: 4.09 MIL/uL — ABNORMAL LOW (ref 4.22–5.81)
RDW: 16.1 % — AB (ref 11.5–15.5)
WBC: 12.8 10*3/uL — AB (ref 4.0–10.5)

## 2013-07-29 LAB — URINALYSIS, ROUTINE W REFLEX MICROSCOPIC
Bilirubin Urine: NEGATIVE
Glucose, UA: NEGATIVE mg/dL
Hgb urine dipstick: NEGATIVE
KETONES UR: NEGATIVE mg/dL
LEUKOCYTES UA: NEGATIVE
NITRITE: NEGATIVE
PROTEIN: NEGATIVE mg/dL
Specific Gravity, Urine: 1.006 (ref 1.005–1.030)
UROBILINOGEN UA: 0.2 mg/dL (ref 0.0–1.0)
pH: 6.5 (ref 5.0–8.0)

## 2013-07-29 LAB — SEDIMENTATION RATE: Sed Rate: 19 mm/hr — ABNORMAL HIGH (ref 0–16)

## 2013-07-29 LAB — LIPASE, BLOOD: Lipase: 37 U/L (ref 11–59)

## 2013-07-29 MED ORDER — HYDROMORPHONE HCL PF 1 MG/ML IJ SOLN
0.5000 mg | Freq: Once | INTRAMUSCULAR | Status: AC
  Administered 2013-07-29: 0.5 mg via INTRAVENOUS
  Filled 2013-07-29: qty 1

## 2013-07-29 MED ORDER — HEPARIN SODIUM (PORCINE) 5000 UNIT/ML IJ SOLN
5000.0000 [IU] | Freq: Three times a day (TID) | INTRAMUSCULAR | Status: DC
  Administered 2013-07-30 (×2): 5000 [IU] via SUBCUTANEOUS
  Filled 2013-07-29 (×17): qty 1

## 2013-07-29 MED ORDER — HYDROMORPHONE HCL PF 1 MG/ML IJ SOLN
1.0000 mg | Freq: Once | INTRAMUSCULAR | Status: AC
  Administered 2013-07-29: 1 mg via INTRAVENOUS
  Filled 2013-07-29: qty 1

## 2013-07-29 MED ORDER — IOHEXOL 300 MG/ML  SOLN
100.0000 mL | Freq: Once | INTRAMUSCULAR | Status: AC | PRN
  Administered 2013-07-29: 100 mL via INTRAVENOUS

## 2013-07-29 MED ORDER — KCL IN DEXTROSE-NACL 20-5-0.45 MEQ/L-%-% IV SOLN: INTRAVENOUS | Status: DC

## 2013-07-29 MED ORDER — ONDANSETRON HCL 4 MG/2ML IJ SOLN: 4.0000 mg | Freq: Four times a day (QID) | INTRAMUSCULAR | Status: DC | PRN

## 2013-07-29 MED ORDER — SODIUM CHLORIDE 0.9 % IV BOLUS (SEPSIS)
1000.0000 mL | Freq: Once | INTRAVENOUS | Status: AC
Start: 2013-07-29 — End: 2013-07-29
  Administered 2013-07-29: 1000 mL via INTRAVENOUS

## 2013-07-29 MED ORDER — ONDANSETRON HCL 4 MG PO TABS: 4.0000 mg | ORAL_TABLET | Freq: Four times a day (QID) | ORAL | Status: DC | PRN

## 2013-07-29 MED ORDER — ONDANSETRON HCL 4 MG/2ML IJ SOLN
4.0000 mg | Freq: Once | INTRAMUSCULAR | Status: AC
  Administered 2013-07-29: 4 mg via INTRAVENOUS
  Filled 2013-07-29: qty 2

## 2013-07-29 MED ORDER — METHYLPREDNISOLONE SODIUM SUCC 125 MG IJ SOLR
125.0000 mg | Freq: Once | INTRAMUSCULAR | Status: AC
  Administered 2013-07-29: 125 mg via INTRAVENOUS
  Filled 2013-07-29: qty 2

## 2013-07-29 MED ORDER — CIPROFLOXACIN IN D5W 400 MG/200ML IV SOLN
400.0000 mg | Freq: Two times a day (BID) | INTRAVENOUS | Status: DC
  Administered 2013-07-30 – 2013-07-31 (×3): 400 mg via INTRAVENOUS
  Filled 2013-07-29 (×5): qty 200

## 2013-07-29 MED ORDER — METHYLPREDNISOLONE SODIUM SUCC 125 MG IJ SOLR
125.0000 mg | Freq: Two times a day (BID) | INTRAMUSCULAR | Status: DC
  Administered 2013-07-30 – 2013-08-01 (×5): 125 mg via INTRAVENOUS
  Filled 2013-07-29 (×8): qty 2

## 2013-07-29 MED ORDER — POTASSIUM CHLORIDE IN NACL 20-0.9 MEQ/L-% IV SOLN
INTRAVENOUS | Status: DC
  Administered 2013-07-30 (×2): via INTRAVENOUS
  Filled 2013-07-29 (×5): qty 1000

## 2013-07-29 MED ORDER — METRONIDAZOLE IN NACL 5-0.79 MG/ML-% IV SOLN
500.0000 mg | Freq: Three times a day (TID) | INTRAVENOUS | Status: DC
  Administered 2013-07-30 – 2013-07-31 (×4): 500 mg via INTRAVENOUS
  Filled 2013-07-29 (×7): qty 100

## 2013-07-29 NOTE — ED Notes (Signed)
The patient is unable to give an urine specimen at this time. The patient has been advised to use call light for assistance. The tech has reported to the RN in charge. 

## 2013-07-29 NOTE — ED Provider Notes (Signed)
CSN: [id number]     Arrival date & time 07/29/13  1646 History   First MD Initiated Contact with Patient 07/29/13 1826     Chief Complaint  Patient presents with  . Abdominal Pain   HPI  Benjamin Shelton is a 33 y.o. male with a PMH of Crohn's who presents to the ED for evaluation of abdominal pain. History was provided by the patient. Patient seen in the ED on 07/14/13 for a similar complaint. Patient was going to be admitted but he left AMA because he had child care issues. Patient states his pain is unchanged from his previous abdominal pain and similar to his chronic Crohn's pain. Pain is located in his right side diffusely and is intermittent. Pain comes and goes and is described as a sharp pain. Was prescribed Percocet for pain during last ED visit which improved his pain temporarily but he ran out of this medication. He also was prescribed prednisone which he completed. Patient states he began vomiting yesterday and has had a few episodes of diarrhea. Has not been keep anything down. No hematochezia. No dysuria, rectal bleeding/pain. He does not have a GI specialist. Has also had fatigue and generalized weakness. No fever, chills, chest pain, SOB, dizziness, lightheadedness, or headaches. Previous abdominal surgeries include a colon resection and hernia repair.    Past Medical History  Diagnosis Date  . Crohn's disease    Past Surgical History  Procedure Laterality Date  . Hernia repair    . Colostomy    . Abdominal surgery     History reviewed. No pertinent family history. History  Substance Use Topics  . Smoking status: Current Every Day Smoker    Types: Cigarettes  . Smokeless tobacco: Not on file  . Alcohol Use: Yes     Comment: social    Review of Systems  Constitutional: Positive for appetite change and fatigue. Negative for fever, chills and activity change.  Respiratory: Negative for cough and shortness of breath.   Cardiovascular: Negative for chest pain and leg  swelling.  Gastrointestinal: Positive for nausea, vomiting, abdominal pain and diarrhea. Negative for constipation, blood in stool, anal bleeding and rectal pain.  Genitourinary: Negative for dysuria, hematuria, difficulty urinating, penile pain and testicular pain.  Musculoskeletal: Negative for back pain and myalgias.  Neurological: Positive for weakness (generalized). Negative for dizziness, light-headedness and headaches.    Allergies  Ibuprofen and Tramadol  Home Medications   Prior to Admission medications   Medication Sig Start Date End Date Taking? Authorizing Provider  azaTHIOprine (IMURAN) 50 MG tablet Take 50 mg by mouth daily.   Yes Historical Provider, MD   BP 131/89  Pulse 117  Temp(Src) 98.5 F (36.9 C) (Oral)  Resp 18  Ht 5\' 3"  (1.6 m)  Wt 125 lb (56.7 kg)  BMI 22.15 kg/m2  SpO2 100%  Filed Vitals:   07/29/13 1653  BP: 131/89  Pulse: 117  Temp: 98.5 F (36.9 C)  TempSrc: Oral  Resp: 18  Height: 5\' 3"  (1.6 m)  Weight: 125 lb (56.7 kg)  SpO2: 100%    Physical Exam  Nursing note and vitals reviewed. Constitutional: He is oriented to person, place, and time. He appears well-developed and well-nourished. No distress.  HENT:  Head: Normocephalic and atraumatic.  Right Ear: External ear normal.  Left Ear: External ear normal.  Nose: Nose normal.  Mouth/Throat: Oropharynx is clear and moist. No oropharyngeal exudate.  Eyes: Conjunctivae are normal. Pupils are equal, round, and reactive to light.  Right eye exhibits no discharge. Left eye exhibits no discharge.  Neck: Normal range of motion. Neck supple.  Cardiovascular: Regular rhythm, normal heart sounds and intact distal pulses.  Exam reveals no gallop and no friction rub.   No murmur heard. Tachycardic  Pulmonary/Chest: Effort normal and breath sounds normal. No respiratory distress. He has no wheezes. He has no rales. He exhibits no tenderness.  Abdominal: Soft. Bowel sounds are normal. He exhibits no  distension and no mass. There is tenderness. There is no rebound and no guarding.  Right upper and lower quadrant tenderness to palpation. Surgical scars present on the abdomen.   Musculoskeletal: Normal range of motion. He exhibits no edema and no tenderness.  Neurological: He is alert and oriented to person, place, and time.  Skin: Skin is warm and dry. He is not diaphoretic.    ED Course  Procedures (including critical care time) Labs Review Labs Reviewed  COMPREHENSIVE METABOLIC PANEL - Abnormal; Notable for the following:    Sodium 131 (*)    Potassium 3.6 (*)    Chloride 89 (*)    Glucose, Bld 144 (*)    AST 143 (*)    ALT 148 (*)    Alkaline Phosphatase 125 (*)    All other components within normal limits  CBC WITH DIFFERENTIAL - Abnormal; Notable for the following:    WBC 12.8 (*)    RBC 4.09 (*)    Hemoglobin 10.1 (*)    HCT 31.3 (*)    MCV 76.5 (*)    MCH 24.7 (*)    RDW 16.1 (*)    Platelets 485 (*)    Neutrophils Relative % 78 (*)    Neutro Abs 10.0 (*)    Lymphocytes Relative 10 (*)    Monocytes Absolute 1.3 (*)    All other components within normal limits  URINALYSIS, ROUTINE W REFLEX MICROSCOPIC    Imaging Review Dg Abd Acute W/chest  07/29/2013   CLINICAL DATA:  Current history of Crohn's disease, presenting with abdominal pain, nausea/vomiting, diarrhea and low-grade fever since yesterday. Prior subtotal colectomy.  EXAM: ACUTE ABDOMEN SERIES (ABDOMEN 2 VIEW & CHEST 1 VIEW)  COMPARISON:  CT abdomen and pelvis 07/14/2013.  FINDINGS: Moderate gaseous distention of the remaining colon with an air-fluid level in the upper pelvis, slightly improved sense the CT just over 2 weeks ago. Mild gaseous distention of adjacent loops of small bowel in the left upper quadrant. No evidence of bowel distention elsewhere. No free intraperitoneal air. Psoas margins intact. No abnormal calcifications. Lumbar scoliosis convex left.  Cardiomediastinal silhouette unremarkable. Lungs  clear. Bronchovascular markings normal. Pulmonary vascularity normal. No visible pleural effusions. No pneumothorax.  IMPRESSION: 1. Moderate gaseous distention of the remaining colon with an air-fluid level in the upper pelvis, though this appearance has improved since the CT just over 2 weeks ago. 2. Mild gaseous distention of adjacent loops of small bowel in the left upper quadrant, localized ileus is favored over obstruction. 3. No free intraperitoneal air. 4. No acute cardiopulmonary disease.   Electronically Signed   By: Evangeline Dakin M.D.   On: 07/29/2013 20:18     EKG Interpretation None      Results for orders placed during the hospital encounter of 07/29/13  COMPREHENSIVE METABOLIC PANEL      Result Value Ref Range   Sodium 131 (*) 137 - 147 mEq/L   Potassium 3.6 (*) 3.7 - 5.3 mEq/L   Chloride 89 (*) 96 - 112 mEq/L  CO2 20  19 - 32 mEq/L   Glucose, Bld 144 (*) 70 - 99 mg/dL   BUN 7  6 - 23 mg/dL   Creatinine, Ser 0.69  0.50 - 1.35 mg/dL   Calcium 9.5  8.4 - 10.5 mg/dL   Total Protein 7.6  6.0 - 8.3 g/dL   Albumin 3.5  3.5 - 5.2 g/dL   AST 143 (*) 0 - 37 U/L   ALT 148 (*) 0 - 53 U/L   Alkaline Phosphatase 125 (*) 39 - 117 U/L   Total Bilirubin 0.4  0.3 - 1.2 mg/dL   GFR calc non Af Amer >90  >90 mL/min   GFR calc Af Amer >90  >90 mL/min  CBC WITH DIFFERENTIAL      Result Value Ref Range   WBC 12.8 (*) 4.0 - 10.5 K/uL   RBC 4.09 (*) 4.22 - 5.81 MIL/uL   Hemoglobin 10.1 (*) 13.0 - 17.0 g/dL   HCT 31.3 (*) 39.0 - 52.0 %   MCV 76.5 (*) 78.0 - 100.0 fL   MCH 24.7 (*) 26.0 - 34.0 pg   MCHC 32.3  30.0 - 36.0 g/dL   RDW 16.1 (*) 11.5 - 15.5 %   Platelets 485 (*) 150 - 400 K/uL   Neutrophils Relative % 78 (*) 43 - 77 %   Neutro Abs 10.0 (*) 1.7 - 7.7 K/uL   Lymphocytes Relative 10 (*) 12 - 46 %   Lymphs Abs 1.2  0.7 - 4.0 K/uL   Monocytes Relative 10  3 - 12 %   Monocytes Absolute 1.3 (*) 0.1 - 1.0 K/uL   Eosinophils Relative 1  0 - 5 %   Eosinophils Absolute 0.1  0.0  - 0.7 K/uL   Basophils Relative 1  0 - 1 %   Basophils Absolute 0.1  0.0 - 0.1 K/uL   Lipase - 37  UA - WNL    MDM   Koby Pickup is a 33 y.o. male with a PMH of Crohn's who presents to the ED for evaluation of abdominal pain, which is likely due to a Crohn's exacerbation. Pain similar to exacerbations in the past.   8:30 PM = Signed-out care to [facility]. Continue to monitor. Await results of abdominal series. Possibly admit for Chron's exacerbation. WBC elevated (12.8) but this is improved from previous. Also has anemia which is stable and chronic. Also has elevated liver enzymes which are chronic.   Rechecks  8:30 PM = Pain somewhat improved. Ordering another 1 mg dilaudid, 1L IV fluids, and zofran.     Current Discharge Medication List      Final impressions: 1. Exacerbation of Crohn's disease   2. Intractable nausea and vomiting   3. Anemia   4. Elevated liver enzymes      Mercy Moore PA-C   This patient was discussed with Dr. Sheffield Slider, PA-C 07/30/13 [phone]

## 2013-07-29 NOTE — ED Notes (Signed)
Called lab to verify they would add on lipase to blood in lab

## 2013-07-29 NOTE — H&P (Signed)
Hospitalist Admission History and Physical  Patient name: Benjamin Shelton Medical record number: [id number] Date of birth: May 07, 1980 Age: 33 y.o. Gender: male  Primary Care Provider: No PCP Per Patient  Chief Complaint: IBD flare, vomiting, diarrhea   History of Present Illness:This is a 33 y.o. year old male with significant prior history of chron's disease s/p colectomy in the past presenting with IBD flare. Pt was seen 2 weeks ago in the ER for IBD flare. Was put on prednisone taper. Pt states that sxs improved for a short duration. Sxs seemed to flare again over past 2 days. Has had recurrent NBNB vomiting and intermittent diarrhea. Diarrhea NBNB. No fevers or chills. Has not had follow up for chrons disease since surgery. Was previously rxd imuran, but pt states that he cannot afford it.  On presentation to the ER, patient hemodynamically stable. Afebrile. White blood cell count 12.8. ESR 19. Hemoglobin 10.1. Sodium 131. Potassium 3.6. CT of the abdomen shows pulse surgical changes from subtotal colectomy with wall thickening in the rectum and anorectal junction with concern for proctitis. Also has 2 mm nonobstructing  right renal stone.  Patient Active Problem List   Diagnosis Date Noted  . IBD (inflammatory bowel disease) 07/29/2013   Past Medical History: Past Medical History  Diagnosis Date  . Crohn's disease     Past Surgical History: Past Surgical History  Procedure Laterality Date  . Hernia repair    . Colostomy    . Abdominal surgery      Social History: History   Social History  . Marital Status: Single    Spouse Name: N/A    Number of Children: N/A  . Years of Education: N/A   Social History Main Topics  . Smoking status: Current Every Day Smoker    Types: Cigarettes  . Smokeless tobacco: None  . Alcohol Use: Yes     Comment: social  . Drug Use: No  . Sexual Activity: None   Other Topics Concern  . None   Social History Narrative  . None     Family History: History reviewed. No pertinent family history.  Allergies: Allergies  Allergen Reactions  . Ibuprofen Itching  . Tramadol Other (See Comments)    irritates chrons     Current Facility-Administered Medications  Medication Dose Route Frequency Provider Last Rate Last Dose  . ciprofloxacin (CIPRO) IVPB 400 mg  400 mg Intravenous Q12H Shanda Howells, MD      . dextrose 5 % and 0.45 % NaCl with KCl 20 mEq/L infusion   Intravenous Continuous Shanda Howells, MD      . heparin injection 5,000 Units  5,000 Units Subcutaneous 3 times per day Shanda Howells, MD      . methylPREDNISolone sodium succinate (SOLU-MEDROL) 125 mg/2 mL injection 125 mg  125 mg Intravenous Q12H Shanda Howells, MD      . metroNIDAZOLE (FLAGYL) IVPB 500 mg  500 mg Intravenous Q8H Shanda Howells, MD      . ondansetron [facility]) tablet 4 mg  4 mg Oral Q6H PRN Shanda Howells, MD       Or  . ondansetron [facility]) injection 4 mg  4 mg Intravenous Q6H PRN Shanda Howells, MD       Current Outpatient Prescriptions  Medication Sig Dispense Refill  . azaTHIOprine (IMURAN) 50 MG tablet Take 50 mg by mouth daily.       Review Of Systems: 12 point ROS negative except as noted above in HPI.  Physical Exam: Filed Vitals:  07/29/13 2245  BP: 141/84  Pulse: 102  Temp:   Resp:     General: alert and cooperative HEENT: PERRLA and extra ocular movement intact Heart: S1, S2 normal, no murmur, rub or gallop, regular rate and rhythm Lungs: clear to auscultation, no wheezes or rales and unlabored breathing Abdomen: Postsurgical changes on the abdomen. Mild generalized tenderness to palpation.  Extremities: extremities normal, atraumatic, no cyanosis or edema Skin:no rashes, no ecchymoses Neurology: normal without focal findings  Labs and Imaging: Lab Results  Component Value Date/Time   NA 131* 07/29/2013  5:06 PM   K 3.6* 07/29/2013  5:06 PM   CL 89* 07/29/2013  5:06 PM   CO2 20 07/29/2013  5:06 PM   BUN 7  07/29/2013  5:06 PM   CREATININE 0.69 07/29/2013  5:06 PM   GLUCOSE 144* 07/29/2013  5:06 PM   Lab Results  Component Value Date   WBC 12.8* 07/29/2013   HGB 10.1* 07/29/2013   HCT 31.3* 07/29/2013   MCV 76.5* 07/29/2013   PLT 485* 07/29/2013    Ct Abdomen Pelvis W Contrast  07/29/2013   CLINICAL DATA:  Bilateral abdominal pain. History of Crohn's disease.  EXAM: CT ABDOMEN AND PELVIS WITH CONTRAST  TECHNIQUE: Multidetector CT imaging of the abdomen and pelvis was performed using the standard protocol following bolus administration of intravenous contrast.  CONTRAST:  160m OMNIPAQUE IOHEXOL 300 MG/ML  SOLN  COMPARISON:  07/14/2013  FINDINGS: Lung bases are within normal.  Abdominal images demonstrate moderate fatty infiltration of the liver. The spleen, pancreas, gallbladder and adrenal glands are within normal. Kidneys normal in size and demonstrate a nonobstructing 2 mm stone over the lower pole collecting system of the right kidney unchanged. There is evidence of of ventral hernia repair over the midline abdominal wall. There is minimal calcified plaque over the abdominal aorta and left common iliac artery. There are postsurgical changes compatible patient's prior partial colectomy. There is a mildly prominent colonic loop in the left lower quadrant with air-fluid level unchanged. Visualized small bowel loops are within normal. There is no free fluid within the abdomen.  Pelvic images demonstrate no change in mild wall thickening over the rectum and anorectal junction. Mild diverticulosis of the rectosigmoid segment. Note that there is a somewhat irregular outpouching/diverticula of the region of the rectosigmoid junction unchanged. Few small mesenteric lymph nodes adjacent the rectosigmoid junction unchanged. Significant free pelvic fluid. Remainder the exam is unchanged.  IMPRESSION: Postsurgical change compatible prior subtotal colectomy in this patient with known Crohn's disease. Continued evidence of  wall thickening of the rectum and anorectal junction which may be due to proctitis.  Moderate hepatic steatosis.  Nonobstructing 2 mm right renal stone.  Postsurgical change compatible prior midline ventral hernia repair.   Electronically Signed   By: DMarin OlpM.D.   On: 07/29/2013 22:08   Dg Abd Acute W/chest  07/29/2013   CLINICAL DATA:  Current history of Crohn's disease, presenting with abdominal pain, nausea/vomiting, diarrhea and low-grade fever since yesterday. Prior subtotal colectomy.  EXAM: ACUTE ABDOMEN SERIES (ABDOMEN 2 VIEW & CHEST 1 VIEW)  COMPARISON:  CT abdomen and pelvis 07/14/2013.  FINDINGS: Moderate gaseous distention of the remaining colon with an air-fluid level in the upper pelvis, slightly improved sense the CT just over 2 weeks ago. Mild gaseous distention of adjacent loops of small bowel in the left upper quadrant. No evidence of bowel distention elsewhere. No free intraperitoneal air. Psoas margins intact. No abnormal calcifications. Lumbar scoliosis  convex left.  Cardiomediastinal silhouette unremarkable. Lungs clear. Bronchovascular markings normal. Pulmonary vascularity normal. No visible pleural effusions. No pneumothorax.  IMPRESSION: 1. Moderate gaseous distention of the remaining colon with an air-fluid level in the upper pelvis, though this appearance has improved since the CT just over 2 weeks ago. 2. Mild gaseous distention of adjacent loops of small bowel in the left upper quadrant, localized ileus is favored over obstruction. 3. No free intraperitoneal air. 4. No acute cardiopulmonary disease.   Electronically Signed   By: Evangeline Dakin M.D.   On: 07/29/2013 20:18     Assessment and Plan: Garin Mata is a 33 y.o. year old male presenting with IBD flare    IBD:  Start IV Solu-Medrol. On Cipro and Flagyl for infectious coverage. Stool studies. GI consult in the morning. CRP pending. Continue to follow.  Leukocytosis: Status post recent prednisone taper.  No overt signs of infection, though covering for intra-abdominal process in the setting of inflammatory bowel disease flare.  Anemia: Hemoglobin seems near baseline. Likely anemia of chronic disease. No active signs of bleeding. Continue to follow.  FEN/GI: Noted hyponatremia and hypokalemia. Hypovolemic on exam. Likely secondary to vomiting. Gentle hydration. Repeat potassium. Check mag level. When necessary Zofran for nausea.    Prophylaxis: Subcutaneous heparin  Disposition:  pending further evaluation. We'll also consult case management to help with medications.  Code Status:full code       Shanda Howells MD  Pager: [phone]

## 2013-07-29 NOTE — ED Provider Notes (Signed)
Discussed case with Vernie Murders, PA-C. Transfer of care from [employer], PA-C at change in shift.  Plan: Admission   Benjamin Shelton is a 33 year old male with past medical history of Crohn's disease (diagnosed when he was 33 years of age) presenting to the ED with abdominal pain that started yesterday. Reported the discomfort is localized to the right side of his abdomen described as a stabbing sensation is progressively gotten worse. Stated that he cannot keep any food or fluids down. Reported that he tried to keep some food down yesterday, reported an episode of emesis shortly after ingestion. Stated he has not eaten anything today. Reported that he was seen and assessed in ED setting in April 2015 where he was discharged secondary to having childcare issues-reported that he was discharged with prednisone, Phenergan reported that these medications resulted in mild relief but patient continued to have discomfort. When asked the patient is followed by gastroenterology-patient denied. Reported that he has not been seen by a gastroenterologist secondary to physician accepting new patients, reported that he has not been taking Imuran secondary to no physician and monetary issues. Stated that he's been feeling weak. Reports mild chills. Denied fevers, melena, hematochezia, dizziness, chest pain, shortness of breath, difficulty breathing, urinary symptoms. PCP none  Patient found laying in bed appears uncomfortable. GCS 15. Alert and oriented. Heart rate noted to be tachycardic with a heart rate of 116 beats per minute. Lungs clear to auscultation to upper and lower lobes bilaterally. Negative abdominal distention noted-scarring from previous colectomy identified. Decreased bowel sounds identified to the lower quadrants bilaterally. Diffuse tenderness upon palpation to the abdomen.   Results for orders placed during the hospital encounter of 07/29/13  COMPREHENSIVE METABOLIC PANEL      Result Value  Ref Range   Sodium 131 (*) 137 - 147 mEq/L   Potassium 3.6 (*) 3.7 - 5.3 mEq/L   Chloride 89 (*) 96 - 112 mEq/L   CO2 20  19 - 32 mEq/L   Glucose, Bld 144 (*) 70 - 99 mg/dL   BUN 7  6 - 23 mg/dL   Creatinine, Ser 0.69  0.50 - 1.35 mg/dL   Calcium 9.5  8.4 - 10.5 mg/dL   Total Protein 7.6  6.0 - 8.3 g/dL   Albumin 3.5  3.5 - 5.2 g/dL   AST 143 (*) 0 - 37 U/L   ALT 148 (*) 0 - 53 U/L   Alkaline Phosphatase 125 (*) 39 - 117 U/L   Total Bilirubin 0.4  0.3 - 1.2 mg/dL   GFR calc non Af Amer >90  >90 mL/min   GFR calc Af Amer >90  >90 mL/min  CBC WITH DIFFERENTIAL      Result Value Ref Range   WBC 12.8 (*) 4.0 - 10.5 K/uL   RBC 4.09 (*) 4.22 - 5.81 MIL/uL   Hemoglobin 10.1 (*) 13.0 - 17.0 g/dL   HCT 31.3 (*) 39.0 - 52.0 %   MCV 76.5 (*) 78.0 - 100.0 fL   MCH 24.7 (*) 26.0 - 34.0 pg   MCHC 32.3  30.0 - 36.0 g/dL   RDW 16.1 (*) 11.5 - 15.5 %   Platelets 485 (*) 150 - 400 K/uL   Neutrophils Relative % 78 (*) 43 - 77 %   Neutro Abs 10.0 (*) 1.7 - 7.7 K/uL   Lymphocytes Relative 10 (*) 12 - 46 %   Lymphs Abs 1.2  0.7 - 4.0 K/uL   Monocytes Relative 10  3 -  12 %   Monocytes Absolute 1.3 (*) 0.1 - 1.0 K/uL   Eosinophils Relative 1  0 - 5 %   Eosinophils Absolute 0.1  0.0 - 0.7 K/uL   Basophils Relative 1  0 - 1 %   Basophils Absolute 0.1  0.0 - 0.1 K/uL  URINALYSIS, ROUTINE W REFLEX MICROSCOPIC      Result Value Ref Range   Color, Urine YELLOW  YELLOW   APPearance CLEAR  CLEAR   Specific Gravity, Urine 1.006  1.005 - 1.030   pH 6.5  5.0 - 8.0   Glucose, UA NEGATIVE  NEGATIVE mg/dL   Hgb urine dipstick NEGATIVE  NEGATIVE   Bilirubin Urine NEGATIVE  NEGATIVE   Ketones, ur NEGATIVE  NEGATIVE mg/dL   Protein, ur NEGATIVE  NEGATIVE mg/dL   Urobilinogen, UA 0.2  0.0 - 1.0 mg/dL   Nitrite NEGATIVE  NEGATIVE   Leukocytes, UA NEGATIVE  NEGATIVE  LIPASE, BLOOD      Result Value Ref Range   Lipase 37  11 - 59 U/L   Ct Abdomen Pelvis W Contrast  07/14/2013   CLINICAL DATA:  Abdominal  pain, nausea, vomiting. History of Crohn's disease. Elevated LFTs.  EXAM: CT ABDOMEN AND PELVIS WITH CONTRAST  TECHNIQUE: Multidetector CT imaging of the abdomen and pelvis was performed using the standard protocol following bolus administration of intravenous contrast.  CONTRAST:  60mL OMNIPAQUE IOHEXOL 300 MG/ML  SOLN  COMPARISON:  None.  FINDINGS: Lung bases are clear.  No effusions.  Heart is normal size.  Diffuse fatty infiltration of the liver. No visible focal abnormality. No biliary ductal dilatation. Gallbladder, spleen, pancreas, adrenals and kidneys are unremarkable.  There is circumferential wall thickening within the rectum and anal regions. This is presumably related to inflammatory bowel disease. Prior subtotal colectomy. Small bowel is unremarkable. No free fluid, free air or adenopathy. Urinary bladder is unremarkable.  Prominent perirectal fat, likely creeping fat related to inflammatory bowel disease. There are small perirectal lymph nodes in the area of inflammation/wall thickening.  No other adenopathy.  No free fluid or free air.  Aorta is normal caliber.  No acute bony abnormality.  IMPRESSION: Prior subtotal colectomy. 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  Diffuse fatty infiltration of the liver.   Electronically Signed   By: Rolm Baptise M.D.   On: 07/14/2013 14:10   Dg Abd Acute W/chest  07/29/2013   CLINICAL DATA:  Current history of Crohn's disease, presenting with abdominal pain, nausea/vomiting, diarrhea and low-grade fever since yesterday. Prior subtotal colectomy.  EXAM: ACUTE ABDOMEN SERIES (ABDOMEN 2 VIEW & CHEST 1 VIEW)  COMPARISON:  CT abdomen and pelvis 07/14/2013.  FINDINGS: Moderate gaseous distention of the remaining colon with an air-fluid level in the upper pelvis, slightly improved sense the CT just over 2 weeks ago. Mild gaseous distention of adjacent loops of small bowel in the left upper quadrant. No evidence  of bowel distention elsewhere. No free intraperitoneal air. Psoas margins intact. No abnormal calcifications. Lumbar scoliosis convex left.  Cardiomediastinal silhouette unremarkable. Lungs clear. Bronchovascular markings normal. Pulmonary vascularity normal. No visible pleural effusions. No pneumothorax.  IMPRESSION: 1. Moderate gaseous distention of the remaining colon with an air-fluid level in the upper pelvis, though this appearance has improved since the CT just over 2 weeks ago. 2. Mild gaseous distention of adjacent loops of small bowel in the left upper quadrant, localized ileus is favored over obstruction. 3. No free intraperitoneal air.  4. No acute cardiopulmonary disease.   Electronically Signed   By: Evangeline Dakin M.D.   On: 07/29/2013 20:18   Ct Abdomen Pelvis W Contrast  07/29/2013   CLINICAL DATA:  Bilateral abdominal pain. History of Crohn's disease.  EXAM: CT ABDOMEN AND PELVIS WITH CONTRAST  TECHNIQUE: Multidetector CT imaging of the abdomen and pelvis was performed using the standard protocol following bolus administration of intravenous contrast.  CONTRAST:  118mL OMNIPAQUE IOHEXOL 300 MG/ML  SOLN  COMPARISON:  07/14/2013  FINDINGS: Lung bases are within normal.  Abdominal images demonstrate moderate fatty infiltration of the liver. The spleen, pancreas, gallbladder and adrenal glands are within normal. Kidneys normal in size and demonstrate a nonobstructing 2 mm stone over the lower pole collecting system of the right kidney unchanged. There is evidence of of ventral hernia repair over the midline abdominal wall. There is minimal calcified plaque over the abdominal aorta and left common iliac artery. There are postsurgical changes compatible patient's prior partial colectomy. There is a mildly prominent colonic loop in the left lower quadrant with air-fluid level unchanged. Visualized small bowel loops are within normal. There is no free fluid within the abdomen.  Pelvic images  demonstrate no change in mild wall thickening over the rectum and anorectal junction. Mild diverticulosis of the rectosigmoid segment. Note that there is a somewhat irregular outpouching/diverticula of the region of the rectosigmoid junction unchanged. Few small mesenteric lymph nodes adjacent the rectosigmoid junction unchanged. Significant free pelvic fluid. Remainder the exam is unchanged.  IMPRESSION: Postsurgical change compatible prior subtotal colectomy in this patient with known Crohn's disease. Continued evidence of wall thickening of the rectum and anorectal junction which may be due to proctitis.  Moderate hepatic steatosis.  Nonobstructing 2 mm right renal stone.  Postsurgical change compatible prior midline ventral hernia repair.   Electronically Signed   By: Marin Olp M.D.   On: 07/29/2013 22:08   CBC noted mild elevated white blood cell count of 12.8 with left shift-elevated neutrophils at 10, monocytes and 1.3, lymphocytes at 10. CMP noted elevated AST and ALT-ALT 148, AST 143. Alkaline phosphatase elevated at 125 - these labs are similar when compared to 2 weeks ago. Lipase negative elevation. Urinalysis negative for infection-negative hemoglobin, nitrites, leukocytes noted. Acute abdominal plain film noted moderate gaseous distention of the remaining colon with an air fluid level in the upper pelvis those appearance is improved since the CT just over 2 weeks ago. Mild gaseous distention of the adjacent loops of small bowel in the left upper quadrant localized ileus is favored over obstruction. No free intraperitoneal air. No acute cardiopulmonary disease noted. CT abdomen pelvis noted poor surgical change compatible with prior subtotal colectomy. Continued evidence of wall thickening of the rectum and anal rectal junction which may be due to proctitis. Negative findings of perforation identified the colon or abscess.  Medications  methylPREDNISolone sodium succinate (SOLU-MEDROL) 125 mg/2  mL injection 125 mg (not administered)  sodium chloride 0.9 % bolus 1,000 mL (0 mLs Intravenous Stopped 07/29/13 2012)  ondansetron (ZOFRAN) injection 4 mg (4 mg Intravenous Given 07/29/13 1942)  HYDROmorphone (DILAUDID) injection 1 mg (1 mg Intravenous Given 07/29/13 1942)  sodium chloride 0.9 % bolus 1,000 mL (0 mLs Intravenous Stopped 07/29/13 2137)  HYDROmorphone (DILAUDID) injection 1 mg (1 mg Intravenous Given 07/29/13 2034)  ondansetron (ZOFRAN) injection 4 mg (4 mg Intravenous Given 07/29/13 2034)  ondansetron (ZOFRAN) injection 4 mg (4 mg Intravenous Given 07/29/13 2124)  iohexol (OMNIPAQUE) 300 MG/ML solution  100 mL (100 mLs Intravenous Contrast Given 07/29/13 2140)  HYDROmorphone (DILAUDID) injection 0.5 mg (0.5 mg Intravenous Given 07/29/13 2209)  sodium chloride 0.9 % bolus 1,000 mL (1,000 mLs Intravenous New Bag/Given 07/29/13 2222)   Filed Vitals:   07/29/13 2045 07/29/13 2100 07/29/13 2115 07/29/13 2215  BP: 146/73 138/77 131/74 149/71  Pulse: 116 119 113 107  Temp:      TempSrc:      Resp:      Height:      Weight:      SpO2: 100% 99% 98% 100%   Diagnoses that have been ruled out:  None  Diagnoses that are still under consideration:  None  Final diagnoses:  Exacerbation of Crohn's disease  Intractable nausea and vomiting    10:27 PM This provider spoke with Dr. Chauncey Cruel. [place] - discussed case, history, presentation, labs and imaging in great detail. Hospitalist to come and assess patient.  Patient presents to the ED with abdominal pain-suspicion to be Crohn's exacerbation. IV fluids, pain medications and antibiotics administered. Patient continues to have abdominal pain. Patient failed fluid challenge-at least 3-4 episodes of emesis while in ED setting. Patient unable to keep food or fluids down. Patient continues to remain tachycardic with a heart rate of 113 beats per minute. Patient to be admitted to the hospital for Crohn's disease as well as intractable  nausea and vomiting.  Jamse Mead, PA-C 07/30/13 1745

## 2013-07-29 NOTE — ED Notes (Signed)
He said he was seen here last week for crohn's and they wanted to admit him but he had no childcare at home so he left AMA. He returns today stating hes continued to have abd pain and vomiting since last week

## 2013-07-30 ENCOUNTER — Encounter (HOSPITAL_COMMUNITY): Payer: Self-pay | Admitting: General Practice

## 2013-07-30 DIAGNOSIS — K5090 Crohn's disease, unspecified, without complications: Principal | ICD-10-CM

## 2013-07-30 DIAGNOSIS — R748 Abnormal levels of other serum enzymes: Secondary | ICD-10-CM

## 2013-07-30 DIAGNOSIS — D649 Anemia, unspecified: Secondary | ICD-10-CM

## 2013-07-30 LAB — CBC WITH DIFFERENTIAL/PLATELET
Basophils Absolute: 0.1 10*3/uL (ref 0.0–0.1)
Basophils Relative: 1 % (ref 0–1)
EOS PCT: 0 % (ref 0–5)
Eosinophils Absolute: 0 10*3/uL (ref 0.0–0.7)
HEMATOCRIT: 31.6 % — AB (ref 39.0–52.0)
HEMOGLOBIN: 10.2 g/dL — AB (ref 13.0–17.0)
LYMPHS ABS: 0.6 10*3/uL — AB (ref 0.7–4.0)
LYMPHS PCT: 5 % — AB (ref 12–46)
MCH: 24.8 pg — ABNORMAL LOW (ref 26.0–34.0)
MCHC: 32.3 g/dL (ref 30.0–36.0)
MCV: 76.9 fL — AB (ref 78.0–100.0)
MONOS PCT: 2 % — AB (ref 3–12)
Monocytes Absolute: 0.2 10*3/uL (ref 0.1–1.0)
Neutro Abs: 10.9 10*3/uL — ABNORMAL HIGH (ref 1.7–7.7)
Neutrophils Relative %: 92 % — ABNORMAL HIGH (ref 43–77)
Platelets: 490 10*3/uL — ABNORMAL HIGH (ref 150–400)
RBC: 4.11 MIL/uL — AB (ref 4.22–5.81)
RDW: 16.3 % — ABNORMAL HIGH (ref 11.5–15.5)
WBC: 11.8 10*3/uL — AB (ref 4.0–10.5)

## 2013-07-30 LAB — COMPREHENSIVE METABOLIC PANEL
ALT: 126 U/L — AB (ref 0–53)
AST: 93 U/L — ABNORMAL HIGH (ref 0–37)
Albumin: 3.6 g/dL (ref 3.5–5.2)
Alkaline Phosphatase: 119 U/L — ABNORMAL HIGH (ref 39–117)
BUN: 5 mg/dL — ABNORMAL LOW (ref 6–23)
CO2: 19 meq/L (ref 19–32)
Calcium: 8.9 mg/dL (ref 8.4–10.5)
Chloride: 100 mEq/L (ref 96–112)
Creatinine, Ser: 0.7 mg/dL (ref 0.50–1.35)
GFR calc Af Amer: >90 mL/min (ref >90)
GLUCOSE: 84 mg/dL (ref 70–99)
Potassium: 4.4 mEq/L (ref 3.7–5.3)
SODIUM: 138 meq/L (ref 137–147)
Total Bilirubin: 0.6 mg/dL (ref 0.3–1.2)
Total Protein: 7.4 g/dL (ref 6.0–8.3)

## 2013-07-30 LAB — IRON AND TIBC
Iron: 11 ug/dL — ABNORMAL LOW (ref 42–135)
Saturation Ratios: 3 % — ABNORMAL LOW (ref 20–55)
TIBC: 397 ug/dL (ref 215–435)
UIBC: 386 ug/dL (ref 125–400)

## 2013-07-30 LAB — GLUCOSE, CAPILLARY
Glucose-Capillary: 195 mg/dL — ABNORMAL HIGH (ref 70–99)
Glucose-Capillary: 126 mg/dL — ABNORMAL HIGH (ref 70–99)

## 2013-07-30 LAB — FOLATE: Folate: 5.7 ng/mL

## 2013-07-30 LAB — VITAMIN B12: Vitamin B-12: 582 pg/mL (ref 211–911)

## 2013-07-30 LAB — RETICULOCYTES
RBC.: 4.11 MIL/uL — ABNORMAL LOW (ref 4.22–5.81)
RETIC CT PCT: 2.5 % (ref 0.4–3.1)
Retic Count, Absolute: 102.8 10*3/uL (ref 19.0–186.0)

## 2013-07-30 LAB — FERRITIN: FERRITIN: 19 ng/mL — AB (ref 22–322)

## 2013-07-30 LAB — C-REACTIVE PROTEIN: CRP: <0.5 mg/dL — ABNORMAL LOW (ref <0.60)

## 2013-07-30 LAB — MAGNESIUM: Magnesium: 1.6 mg/dL (ref 1.5–2.5)

## 2013-07-30 LAB — CLOSTRIDIUM DIFFICILE BY PCR: Toxigenic C. Difficile by PCR: NEGATIVE

## 2013-07-30 MED ORDER — INSULIN ASPART 100 UNIT/ML ~~LOC~~ SOLN
0.0000 [IU] | Freq: Three times a day (TID) | SUBCUTANEOUS | Status: DC
  Administered 2013-07-30 – 2013-07-31 (×2): 2 [IU] via SUBCUTANEOUS
  Administered 2013-07-31: 1 [IU] via SUBCUTANEOUS
  Administered 2013-07-31: 2 [IU] via SUBCUTANEOUS
  Administered 2013-08-01 (×3): 1 [IU] via SUBCUTANEOUS

## 2013-07-30 MED ORDER — ONDANSETRON HCL 4 MG PO TABS
4.0000 mg | ORAL_TABLET | ORAL | Status: DC | PRN
  Administered 2013-08-03: 4 mg via ORAL
  Filled 2013-07-30 (×2): qty 1

## 2013-07-30 MED ORDER — ONDANSETRON HCL 4 MG/2ML IJ SOLN
4.0000 mg | INTRAMUSCULAR | Status: DC | PRN
  Administered 2013-07-30 – 2013-08-03 (×22): 4 mg via INTRAVENOUS
  Filled 2013-07-30 (×23): qty 2

## 2013-07-30 MED ORDER — HYDROMORPHONE HCL PF 1 MG/ML IJ SOLN
0.5000 mg | INTRAMUSCULAR | Status: DC | PRN
  Administered 2013-07-30 – 2013-08-02 (×17): 0.5 mg via INTRAVENOUS
  Filled 2013-07-30 (×21): qty 1

## 2013-07-30 NOTE — Care Management Note (Signed)
  Page 2 of 2   08/03/2013     7:57:48 AM CARE MANAGEMENT NOTE 08/03/2013  Patient:  Benjamin Shelton, Benjamin Shelton   Account Number:  [id number]  Date Initiated:  07/30/2013  Documentation initiated by:  Magdalen Spatz  Subjective/Objective Assessment:     Action/Plan:   Anticipated DC Date:  08/03/2013   Anticipated DC Plan:  [place]  CM consult  [facility]  [facility]      Choice offered to / List presented to:             Status of service:  Completed, signed off Medicare Important Message given?   (If response is "NO", the following Medicare IM given date fields will be blank) Date Medicare IM given:   Date Additional Medicare IM given:    Discharge Disposition:    Per UR Regulation:    If discussed at Long Length of Stay Meetings, dates discussed:   08/03/2013    Comments:     07-30-13 See note from ED Case Manager 07-14-13 .  Patient states he has called [id number] rep [name] and left voice mail to set up appointment for orange card application . He has not received a call back as of yet. He did receive information on [place] and [facility] , but has not called yet to make an appointment.   Patient needing assistance with Imuran .  CM called assistance program [phone] , if patient is accepted into program gentric Azathioprine 50 mg tabs are $20.00 for 90 .   Gave patient copy of application .  Can provide Melissa letter at discharge for one month coverage of Azathioprine . Spoke to [place] at [facility] 30  ,50 mg tabs cost $37.25 so no over ride needed.   MD needs to complete one section , provide prescription , and  patient  needs to complete remainder  and send all listed information to address on application.  At discharge patient will need two prescriptions for Azathioprine , one 30 day supply for [facility] program and one prescription for assistance program.  [facility] and [facility] , patient  has appointment to see a MD Aug 13, 2013 at 2 pm , and an orange card application appointment on June 3 , 2015 at 2pm.  This information along with address and phone number for [place] given to patinet and placed on discharge instructions .  MD and patient aware of above .  Magdalen Spatz RN BSN [phone]

## 2013-07-30 NOTE — Discharge Instructions (Signed)
[facility] , appointment to see a MD Aug 13, 2013 at 2 pm , and an orange card application appointment on June 3 , 2015 at [facility] and [facility]   [address] [place] , [place]  Phone [phone]

## 2013-07-30 NOTE — Progress Notes (Signed)
Patient ID: Benjamin Shelton, male   DOB: [birth year], 33 y.o.   MRN: [id number]  [facility] PROGRESS NOTE  Vikrant Pryce TZG:[id number] DOB: 1981/01/29 DOA: 07/29/2013 PCP: No PCP Per Patient  Brief narrative: This is a 33 y.o. year old male with chron's disease s/p colectomy in the past, presenting with IBD flare. Pt was seen 2 weeks ago in the ER for IBD flare. Was put on prednisone taper. Pt states that sxs improved for a short duration. Sxs seemed to flared again over past 2 days. Was previously rxd imuran, but pt states that he cannot afford it.   On presentation to the ER, patient hemodynamically stable. Afebrile. White blood cell count 12.8. ESR 19. Hemoglobin 10.1. Sodium 131. Potassium 3.6. CT of the abdomen shows pulse surgical changes from subtotal colectomy with wall thickening in the rectum and anorectal junction with concern for proctitis. Also has 2 mm nonobstructing right renal stone.   Active Problems:   IBD (inflammatory bowel disease) - pt is clinically improving and says he feels better - he wants to have his diet advanced to regular  - continue Cipro and Flagyl day #2 - continue Solumedrol and taper down as clinically indicated    Moderate malnutrition - secondary to acute flare of chronic illness - advance diet as pt able to tolerate - would benefit from nutrition supplementation    Hypokalemia - supplemented and WNL this AM   Anemia of chronic disease - Hg and Hct stable and at baseline - CBC in AM   Hyponatremia - secondary to dehydration - continue IVF and repeat BMP inAM   Hyperglycemia - from steroids - will place temporarily on SSI   Consultants:  None  Procedures/Studies: Ct Abdomen Pelvis W Contrast  07/29/2013   Postsurgical change compatible prior subtotal colectomy in this patient with known Crohn's disease. Continued evidence of wall thickening of the rectum and anorectal junction which may be due to proctitis.  Moderate hepatic  steatosis.  Nonobstructing 2 mm right renal stone.  Postsurgical change compatible prior midline ventral hernia repair.    Dg Abd Acute W/chest  07/29/2013  Moderate gaseous distention of the remaining colon with an air-fluid level in the upper pelvis. Mild gaseous distention of adjacent loops of small bowel in the LUQ, localized ileus is favored over obstruction. No free intraperitoneal air. No acute cardiopulmonary disease.    Antibiotics:  Cipro 5/14 -->  Flagyl 5/14 -->   Code Status: Full Family Communication: Pt at bedside Disposition Plan: Home when medically stable  HPI/Subjective: No events overnight.   Objective: Filed Vitals:   07/29/13 2245 07/29/13 2356 07/30/13 0439 07/30/13 1337  BP: 141/84 153/90 143/74 150/94  Pulse: 102 97 70 82  Temp:  97.7 F (36.5 C) 97.7 F (36.5 C) 98.4 F (36.9 C)  TempSrc:  Oral Oral Oral  Resp:  _0 Height:  5' 3.5" (1.613 m)    Weight:  54.2 kg (119 lb 7.8 oz)    SpO2: 96% 100% 100% 100%    Intake/Output Summary (Last 24 hours) at 07/30/13 1649 Last data filed at 07/30/13 0100  Gross per 24 hour  Intake      0 ml  Output    200 ml  Net   -200 ml    Exam:   General:  Pt is alert, follows commands appropriately, not in acute distress  Cardiovascular: Regular rate and rhythm, S1/S2, no murmurs, no rubs, no gallops  Respiratory: Clear to auscultation bilaterally, no  wheezing, no crackles, no rhonchi  Abdomen: Soft, tender in epigastric area non distended, bowel sounds present, no guarding  Extremities: No edema, pulses DP and PT palpable bilaterally  Neuro: Grossly nonfocal  Data Reviewed: Basic Metabolic Panel:  Recent Labs Lab 07/29/13 1706 07/30/13 0215  NA 131* 138  K 3.6* 4.4  CL 89* 100  CO2 20 19  GLUCOSE 144* 84  BUN 7 5*  CREATININE 0.69 0.70  CALCIUM 9.5 8.9  MG  --  1.6   Liver Function Tests:  Recent Labs Lab 07/29/13 1706 07/30/13 0215  AST 143* 93*  ALT 148* 126*  ALKPHOS 125*  119*  BILITOT 0.4 0.6  PROT 7.6 7.4  ALBUMIN 3.5 3.6    Recent Labs Lab 07/29/13 1706  LIPASE 37   CBC:  Recent Labs Lab 07/29/13 1706 07/30/13 0215  WBC 12.8* 11.8*  NEUTROABS 10.0* 10.9*  HGB 10.1* 10.2*  HCT 31.3* 31.6*  MCV 76.5* 76.9*  PLT 485* 490*   Recent Results (from the past 240 hour(s))  CLOSTRIDIUM DIFFICILE BY PCR     Status: None   Collection Time    07/30/13 11:08 AM      Result Value Ref Range Status   C difficile by pcr NEGATIVE  NEGATIVE Final     Scheduled Meds: . ciprofloxacin  400 mg Intravenous Q12H  . heparin  5,000 Units Subcutaneous 3 times per day  . methylPREDNISolone (SOLU-MEDROL) injection  125 mg Intravenous Q12H  . metronidazole  500 mg Intravenous Q8H   Continuous Infusions: . 0.9 % NaCl with KCl 20 mEq / L 125 mL/hr at 07/30/13 0130   Theodis Blaze, MD  [facility] Pager [phone]  If 7PM-7AM, please contact night-coverage [email or web address] Password [email or web address] 07/30/2013, 4:49 PM   LOS: 1 day

## 2013-07-31 DIAGNOSIS — K5289 Other specified noninfective gastroenteritis and colitis: Secondary | ICD-10-CM

## 2013-07-31 LAB — GLUCOSE, CAPILLARY
GLUCOSE-CAPILLARY: 131 mg/dL — AB (ref 70–99)
GLUCOSE-CAPILLARY: 151 mg/dL — AB (ref 70–99)
GLUCOSE-CAPILLARY: 169 mg/dL — AB (ref 70–99)
Glucose-Capillary: 141 mg/dL — ABNORMAL HIGH (ref 70–99)

## 2013-07-31 LAB — COMPREHENSIVE METABOLIC PANEL
ALK PHOS: 125 U/L — AB (ref 39–117)
ALT: 95 U/L — ABNORMAL HIGH (ref 0–53)
AST: 51 U/L — AB (ref 0–37)
Albumin: 3.5 g/dL (ref 3.5–5.2)
BILIRUBIN TOTAL: 0.6 mg/dL (ref 0.3–1.2)
BUN: 8 mg/dL (ref 6–23)
CHLORIDE: 94 meq/L — AB (ref 96–112)
CO2: 26 mEq/L (ref 19–32)
Calcium: 9.2 mg/dL (ref 8.4–10.5)
Creatinine, Ser: 0.72 mg/dL (ref 0.50–1.35)
GFR calc Af Amer: >90 mL/min (ref >90)
GFR calc non Af Amer: >90 mL/min (ref >90)
Glucose, Bld: 127 mg/dL — ABNORMAL HIGH (ref 70–99)
POTASSIUM: 4.2 meq/L (ref 3.7–5.3)
Sodium: 135 mEq/L — ABNORMAL LOW (ref 137–147)
Total Protein: 7.4 g/dL (ref 6.0–8.3)

## 2013-07-31 LAB — CBC WITH DIFFERENTIAL/PLATELET
BASOS PCT: 0 % (ref 0–1)
Basophils Absolute: 0 10*3/uL (ref 0.0–0.1)
Eosinophils Absolute: 0 10*3/uL (ref 0.0–0.7)
Eosinophils Relative: 0 % (ref 0–5)
HCT: 32.9 % — ABNORMAL LOW (ref 39.0–52.0)
HEMOGLOBIN: 10.4 g/dL — AB (ref 13.0–17.0)
Lymphocytes Relative: 3 % — ABNORMAL LOW (ref 12–46)
Lymphs Abs: 0.4 10*3/uL — ABNORMAL LOW (ref 0.7–4.0)
MCH: 24.5 pg — ABNORMAL LOW (ref 26.0–34.0)
MCHC: 31.6 g/dL (ref 30.0–36.0)
MCV: 77.6 fL — ABNORMAL LOW (ref 78.0–100.0)
MONO ABS: 0.5 10*3/uL (ref 0.1–1.0)
MONOS PCT: 4 % (ref 3–12)
NEUTROS PCT: 93 % — AB (ref 43–77)
Neutro Abs: 11.3 10*3/uL — ABNORMAL HIGH (ref 1.7–7.7)
Platelets: 459 10*3/uL — ABNORMAL HIGH (ref 150–400)
RBC: 4.24 MIL/uL (ref 4.22–5.81)
RDW: 16.4 % — ABNORMAL HIGH (ref 11.5–15.5)
WBC: 12.2 10*3/uL — ABNORMAL HIGH (ref 4.0–10.5)

## 2013-07-31 LAB — FECAL LACTOFERRIN, QUANT: FECAL LACTOFERRIN: POSITIVE

## 2013-07-31 MED ORDER — CIPROFLOXACIN HCL 500 MG PO TABS
500.0000 mg | ORAL_TABLET | Freq: Two times a day (BID) | ORAL | Status: DC
  Administered 2013-07-31 – 2013-08-03 (×7): 500 mg via ORAL
  Filled 2013-07-31 (×9): qty 1

## 2013-07-31 MED ORDER — METRONIDAZOLE 500 MG PO TABS
500.0000 mg | ORAL_TABLET | Freq: Three times a day (TID) | ORAL | Status: DC
  Administered 2013-07-31 – 2013-08-03 (×9): 500 mg via ORAL
  Filled 2013-07-31 (×16): qty 1

## 2013-07-31 NOTE — Progress Notes (Signed)
Patient ID: Benjamin Shelton, male   DOB: 01-01-1981, 33 y.o.   MRN: [id number]  [facility] PROGRESS NOTE  Benjamin Shelton [id number]:[id number] DOB: Aug 12, 1980 DOA: 07/29/2013 PCP: No PCP Per Patient  Brief narrative:  This is a 33 y.o. year old male with chron's disease s/p colectomy in the past, presenting with IBD flare. Pt was seen 2 weeks ago in the ER for IBD flare. Was put on prednisone taper. Pt states that sxs improved for a short duration. Sxs seemed to flared again over past 2 days. Was previously rxd imuran, but pt states that he cannot afford it.   On presentation to the ER, patient hemodynamically stable. Afebrile. White blood cell count 12.8. ESR 19. Hemoglobin 10.1. Sodium 131. Potassium 3.6. CT of the abdomen shows pulse surgical changes from subtotal colectomy with wall thickening in the rectum and anorectal junction with concern for proctitis. Also has 2 mm nonobstructing right renal stone.   Active Problems:  IBD (inflammatory bowel disease)  - pt is clinically improving and says he feels better  - tolerating current diet well, regular diet, will change ABX to PO  - continue Cipro and Flagyl day #3 - continue Solumedrol and taper down as clinically indicated  Moderate malnutrition  - secondary to acute flare of chronic illness  - advanced diet to regular - would benefit from nutrition supplementation  Hypokalemia  - supplemented and WNL this AM  Anemia of chronic disease  - Hg and Hct stable and at baseline  - CBC in AM  Hyponatremia  - secondary to dehydration  - stop IVF as pt tolerating regular diet well  Hyperglycemia  - from steroids  - placed temporarily on SSI   Consultants:  None Procedures/Studies:  Ct Abdomen Pelvis W Contrast 07/29/2013 Postsurgical change compatible prior subtotal colectomy in this patient with known Crohn's disease. Continued evidence of wall thickening of the rectum and anorectal junction which may be due to proctitis. Moderate  hepatic steatosis. Nonobstructing 2 mm right renal stone. Postsurgical change compatible prior midline ventral hernia repair.  Dg Abd Acute W/chest 07/29/2013 Moderate gaseous distention of the remaining colon with an air-fluid level in the upper pelvis. Mild gaseous distention of adjacent loops of small bowel in the LUQ, localized ileus is favored over obstruction. No free intraperitoneal air. No acute cardiopulmonary disease.  Antibiotics:  Cipro 5/14 -->  Flagyl 5/14 -->   Code Status: Full  Family Communication: Pt at bedside  Disposition Plan: Home when medically stable   HPI/Subjective: No events overnight.   Objective: Filed Vitals:   07/30/13 0439 07/30/13 1337 07/30/13 2220 07/31/13 0624  BP: 143/74 150/94 131/94 131/66  Pulse: 70 82 96 69  Temp: 97.7 F (36.5 C) 98.4 F (36.9 C) 98.6 F (37 C) 98 F (36.7 C)  TempSrc: Oral Oral Oral Oral  Resp: $Remo'16 19 18 17  'YxbbO$ Height:      Weight:      SpO2: 100% 100% 98% 100%    Intake/Output Summary (Last 24 hours) at 07/31/13 0808 Last data filed at 07/31/13 1779  Gross per 24 hour  Intake 2657.92 ml  Output   1226 ml  Net 1431.92 ml    Exam:   General:  Pt is alert, follows commands appropriately, not in acute distress  Cardiovascular: Regular rate and rhythm, S1/S2, no murmurs, no rubs, no gallops  Respiratory: Clear to auscultation bilaterally, no wheezing, no crackles, no rhonchi  Abdomen: Soft, non tender, non distended, bowel sounds present, no guarding  Data Reviewed: Basic Metabolic Panel:  Recent Labs Lab 07/29/13 1706 07/30/13 0215 07/31/13 0605  NA 131* 138 135*  K 3.6* 4.4 4.2  CL 89* 100 94*  CO2 $Re'20 19 26  'XdU$ GLUCOSE 144* 84 127*  BUN 7 5* 8  CREATININE 0.69 0.70 0.72  CALCIUM 9.5 8.9 9.2  MG  --  1.6  --    Liver Function Tests:  Recent Labs Lab 07/29/13 1706 07/30/13 0215 07/31/13 0605  AST 143* 93* 51*  ALT 148* 126* 95*  ALKPHOS 125* 119* 125*  BILITOT 0.4 0.6 0.6  PROT 7.6 7.4 7.4   ALBUMIN 3.5 3.6 3.5    Recent Labs Lab 07/29/13 1706  LIPASE 37   CBC:  Recent Labs Lab 07/29/13 1706 07/30/13 0215 07/31/13 0605  WBC 12.8* 11.8* 12.2*  NEUTROABS 10.0* 10.9* 11.3*  HGB 10.1* 10.2* 10.4*  HCT 31.3* 31.6* 32.9*  MCV 76.5* 76.9* 77.6*  PLT 485* 490* 459*   CBG:  Recent Labs Lab 07/30/13 1736 07/30/13 2143 07/31/13 0727  GLUCAP 195* 126* 131*    Recent Results (from the past 240 hour(s))  CLOSTRIDIUM DIFFICILE BY PCR     Status: None   Collection Time    07/30/13 11:08 AM      Result Value Ref Range Status   C difficile by pcr NEGATIVE  NEGATIVE Final     Scheduled Meds: . ciprofloxacin  400 mg Intravenous Q12H  . heparin  5,000 Units Subcutaneous 3 times per day  . insulin aspart  0-9 Units Subcutaneous TID WC  . methylPREDNISolone (SOLU-MEDROL) injection  125 mg Intravenous Q12H  . metronidazole  500 mg Intravenous Q8H   Continuous Infusions: . 0.9 % NaCl with KCl 20 mEq / L 50 mL/hr at 07/30/13 1743    Benjamin Blaze, MD  [facility] Pager [phone]  If 7PM-7AM, please contact night-coverage [email or web address] Password [email or web address] 07/31/2013, 8:08 AM   LOS: 2 days

## 2013-08-01 LAB — CBC WITH DIFFERENTIAL/PLATELET
BASOS PCT: 0 % (ref 0–1)
Basophils Absolute: 0 10*3/uL (ref 0.0–0.1)
Eosinophils Absolute: 0 10*3/uL (ref 0.0–0.7)
Eosinophils Relative: 0 % (ref 0–5)
HCT: 32.2 % — ABNORMAL LOW (ref 39.0–52.0)
HEMOGLOBIN: 10.5 g/dL — AB (ref 13.0–17.0)
LYMPHS ABS: 0.5 10*3/uL — AB (ref 0.7–4.0)
Lymphocytes Relative: 4 % — ABNORMAL LOW (ref 12–46)
MCH: 25 pg — ABNORMAL LOW (ref 26.0–34.0)
MCHC: 32.6 g/dL (ref 30.0–36.0)
MCV: 76.7 fL — ABNORMAL LOW (ref 78.0–100.0)
MONOS PCT: 8 % (ref 3–12)
Monocytes Absolute: 1.1 10*3/uL — ABNORMAL HIGH (ref 0.1–1.0)
NEUTROS ABS: 11.6 10*3/uL — AB (ref 1.7–7.7)
NEUTROS PCT: 88 % — AB (ref 43–77)
Platelets: 412 10*3/uL — ABNORMAL HIGH (ref 150–400)
RBC: 4.2 MIL/uL — AB (ref 4.22–5.81)
RDW: 16.3 % — ABNORMAL HIGH (ref 11.5–15.5)
WBC: 13.1 10*3/uL — ABNORMAL HIGH (ref 4.0–10.5)

## 2013-08-01 LAB — COMPREHENSIVE METABOLIC PANEL
ALBUMIN: 3.4 g/dL — AB (ref 3.5–5.2)
ALT: 127 U/L — ABNORMAL HIGH (ref 0–53)
AST: 161 U/L — ABNORMAL HIGH (ref 0–37)
Alkaline Phosphatase: 129 U/L — ABNORMAL HIGH (ref 39–117)
BUN: 10 mg/dL (ref 6–23)
CHLORIDE: 92 meq/L — AB (ref 96–112)
CO2: 27 mEq/L (ref 19–32)
Calcium: 8.9 mg/dL (ref 8.4–10.5)
Creatinine, Ser: 0.71 mg/dL (ref 0.50–1.35)
GFR calc Af Amer: >90 mL/min (ref >90)
GFR calc non Af Amer: >90 mL/min (ref >90)
GLUCOSE: 109 mg/dL — AB (ref 70–99)
Potassium: 3.9 mEq/L (ref 3.7–5.3)
Sodium: 133 mEq/L — ABNORMAL LOW (ref 137–147)
Total Bilirubin: 0.4 mg/dL (ref 0.3–1.2)
Total Protein: 7.2 g/dL (ref 6.0–8.3)

## 2013-08-01 LAB — GLUCOSE, CAPILLARY
GLUCOSE-CAPILLARY: 136 mg/dL — AB (ref 70–99)
GLUCOSE-CAPILLARY: 118 mg/dL — AB (ref 70–99)
Glucose-Capillary: 124 mg/dL — ABNORMAL HIGH (ref 70–99)
Glucose-Capillary: 134 mg/dL — ABNORMAL HIGH (ref 70–99)

## 2013-08-01 MED ORDER — METHYLPREDNISOLONE SODIUM SUCC 40 MG IJ SOLR
40.0000 mg | Freq: Every day | INTRAMUSCULAR | Status: DC
  Administered 2013-08-02 – 2013-08-03 (×2): 40 mg via INTRAVENOUS
  Filled 2013-08-01 (×3): qty 1

## 2013-08-01 NOTE — Progress Notes (Signed)
Patient ID: Benjamin Shelton, male   DOB: 23-Aug-1980, 33 y.o.   MRN: [id number]  [facility] PROGRESS NOTE  Tayte Mcwherter [id number]:[id number] DOB: April 17, 1980 DOA: 07/29/2013 PCP: No PCP Per Patient  Brief narrative:  This is a 33 y.o. year old male with chron's disease s/p colectomy in the past, presenting with IBD flare. Pt was seen 2 weeks ago in the ER for IBD flare. Was put on prednisone taper. Pt states that sxs improved for a short duration. Sxs seemed to flared again over past 2 days. Was previously rxd imuran, but pt states that he cannot afford it.   On presentation to the ER, patient hemodynamically stable. Afebrile. White blood cell count 12.8. ESR 19. Hemoglobin 10.1. Sodium 131. Potassium 3.6. CT of the abdomen shows pulse surgical changes from subtotal colectomy with wall thickening in the rectum and anorectal junction with concern for proctitis. Also has 2 mm nonobstructing right renal stone.   Active Problems:  IBD (inflammatory bowel disease)  - pt is clinically improving and says he feels better  - tolerating current diet well, regular diet, will change ABX to PO  - continue Cipro and Flagyl day #4 - repeat CT abd in am if LFT continuing to trend up - continue Solumedrol and taper down Moderate malnutrition  - secondary to acute flare of chronic illness  - tolerating regular diet well  - would benefit from nutrition supplementation  Hypokalemia  - supplemented and WNL this AM  Anemia of chronic disease  - Hg and Hct stable and at baseline  - CBC in AM  Hyponatremia  - secondary to dehydration  - stop IVF as pt tolerating regular diet well  Hyperglycemia  - from steroids  - placed temporarily on SSI  Transaminitis - unclear etiology - will check CMET in AM - check lipase as well - may need Ct abd if LFT continue to trend up  Consultants:  None Procedures/Studies:  Ct Abdomen Pelvis W Contrast 07/29/2013 Postsurgical change compatible prior subtotal  colectomy in this patient with known Crohn's disease. Continued evidence of wall thickening of the rectum and anorectal junction which may be due to proctitis. Moderate hepatic steatosis. Nonobstructing 2 mm right renal stone. Postsurgical change compatible prior midline ventral hernia repair.  Dg Abd Acute W/chest 07/29/2013 Moderate gaseous distention of the remaining colon with an air-fluid level in the upper pelvis. Mild gaseous distention of adjacent loops of small bowel in the LUQ, localized ileus is favored over obstruction. No free intraperitoneal air. No acute cardiopulmonary disease.  Antibiotics:  Cipro 5/14 -->  Flagyl 5/14 -->   Code Status: Full  Family Communication: Pt at bedside  Disposition Plan: Home when medically stable   HPI/Subjective: No events overnight.   Objective: Filed Vitals:   07/31/13 0624 07/31/13 1350 07/31/13 2149 08/01/13 0606  BP: 131/66 121/69 137/87 132/75  Pulse: 69 114 87 74  Temp: 98 F (36.7 C) 98.3 F (36.8 C) 98.3 F (36.8 C) 97.4 F (36.3 C)  TempSrc: Oral Oral Oral Oral  Resp: $Remo'17 17 18 17  'UtqIr$ Height:      Weight:      SpO2: 100% 99% 100% 98%    Intake/Output Summary (Last 24 hours) at 08/01/13 1248 Last data filed at 08/01/13 0939  Gross per 24 hour  Intake   1140 ml  Output      0 ml  Net   1140 ml    Exam:   General:  Pt is alert, follows commands appropriately,  not in acute distress  Cardiovascular: Regular rate and rhythm, S1/S2, no murmurs, no rubs, no gallops  Respiratory: Clear to auscultation bilaterally, no wheezing, no crackles, no rhonchi  Abdomen: Soft, non tender, non distended, bowel sounds present, no guarding  Extremities: No edema, pulses DP and PT palpable bilaterally  Neuro: Grossly nonfocal  Data Reviewed: Basic Metabolic Panel:  Recent Labs Lab 07/29/13 1706 07/30/13 0215 07/31/13 0605 08/01/13 0535  NA 131* 138 135* 133*  K 3.6* 4.4 4.2 3.9  CL 89* 100 94* 92*  CO2 $Re'20 19 26 27  'fno$ GLUCOSE  144* 84 127* 109*  BUN 7 5* 8 10  CREATININE 0.69 0.70 0.72 0.71  CALCIUM 9.5 8.9 9.2 8.9  MG  --  1.6  --   --    Liver Function Tests:  Recent Labs Lab 07/29/13 1706 07/30/13 0215 07/31/13 0605 08/01/13 0535  AST 143* 93* 51* 161*  ALT 148* 126* 95* 127*  ALKPHOS 125* 119* 125* 129*  BILITOT 0.4 0.6 0.6 0.4  PROT 7.6 7.4 7.4 7.2  ALBUMIN 3.5 3.6 3.5 3.4*    Recent Labs Lab 07/29/13 1706  LIPASE 37   CBC:  Recent Labs Lab 07/29/13 1706 07/30/13 0215 07/31/13 0605 08/01/13 0535  WBC 12.8* 11.8* 12.2* 13.1*  NEUTROABS 10.0* 10.9* 11.3* 11.6*  HGB 10.1* 10.2* 10.4* 10.5*  HCT 31.3* 31.6* 32.9* 32.2*  MCV 76.5* 76.9* 77.6* 76.7*  PLT 485* 490* 459* 412*   CBG:  Recent Labs Lab 07/31/13 1155 07/31/13 1749 07/31/13 2129 08/01/13 0731 08/01/13 1153  GLUCAP 151* 169* 141* 124* 134*    Recent Results (from the past 240 hour(s))  STOOL CULTURE     Status: None   Collection Time    07/30/13 10:33 AM      Result Value Ref Range Status   Specimen Description STOOL   Final   Special Requests NONE   Final   Culture     Final   Value: Culture reincubated for better growth     Performed at [facility]   Report Status PENDING   Incomplete  CLOSTRIDIUM DIFFICILE BY PCR     Status: None   Collection Time    07/30/13 11:08 AM      Result Value Ref Range Status   C difficile by pcr NEGATIVE  NEGATIVE Final     Scheduled Meds: . ciprofloxacin  500 mg Oral BID  . heparin  5,000 Units Subcutaneous 3 times per day  . insulin aspart  0-9 Units Subcutaneous TID WC  . methylPREDNISolone (SOLU-MEDROL) injection  125 mg Intravenous Q12H  . metroNIDAZOLE  500 mg Oral 3 times per day   Continuous Infusions:   Theodis Blaze, MD  [facility] Pager [phone]  If 7PM-7AM, please contact night-coverage [email or web address] Password [email or web address] 08/01/2013, 12:48 PM   LOS: 3 days

## 2013-08-02 DIAGNOSIS — D509 Iron deficiency anemia, unspecified: Secondary | ICD-10-CM

## 2013-08-02 DIAGNOSIS — R109 Unspecified abdominal pain: Secondary | ICD-10-CM

## 2013-08-02 LAB — CBC WITH DIFFERENTIAL/PLATELET
Basophils Absolute: 0 10*3/uL (ref 0.0–0.1)
Basophils Relative: 0 % (ref 0–1)
Eosinophils Absolute: 0.1 10*3/uL (ref 0.0–0.7)
Eosinophils Relative: 1 % (ref 0–5)
HEMATOCRIT: 31.1 % — AB (ref 39.0–52.0)
HEMOGLOBIN: 9.7 g/dL — AB (ref 13.0–17.0)
LYMPHS ABS: 1.7 10*3/uL (ref 0.7–4.0)
LYMPHS PCT: 17 % (ref 12–46)
MCH: 24.4 pg — ABNORMAL LOW (ref 26.0–34.0)
MCHC: 31.2 g/dL (ref 30.0–36.0)
MCV: 78.3 fL (ref 78.0–100.0)
MONO ABS: 1.2 10*3/uL — AB (ref 0.1–1.0)
Monocytes Relative: 12 % (ref 3–12)
Neutro Abs: 7.2 10*3/uL (ref 1.7–7.7)
Neutrophils Relative %: 70 % (ref 43–77)
Platelets: 393 10*3/uL (ref 150–400)
RBC: 3.97 MIL/uL — AB (ref 4.22–5.81)
RDW: 16.5 % — AB (ref 11.5–15.5)
WBC: 10.2 10*3/uL (ref 4.0–10.5)

## 2013-08-02 LAB — COMPREHENSIVE METABOLIC PANEL
ALT: 160 U/L — ABNORMAL HIGH (ref 0–53)
AST: 142 U/L — ABNORMAL HIGH (ref 0–37)
Albumin: 3.3 g/dL — ABNORMAL LOW (ref 3.5–5.2)
Alkaline Phosphatase: 118 U/L — ABNORMAL HIGH (ref 39–117)
BILIRUBIN TOTAL: 0.3 mg/dL (ref 0.3–1.2)
BUN: 11 mg/dL (ref 6–23)
CO2: 29 meq/L (ref 19–32)
CREATININE: 0.85 mg/dL (ref 0.50–1.35)
Calcium: 8.8 mg/dL (ref 8.4–10.5)
Chloride: 95 mEq/L — ABNORMAL LOW (ref 96–112)
GFR calc Af Amer: >90 mL/min (ref >90)
GLUCOSE: 124 mg/dL — AB (ref 70–99)
Potassium: 2.7 mEq/L — CL (ref 3.7–5.3)
Sodium: 136 mEq/L — ABNORMAL LOW (ref 137–147)
Total Protein: 6.8 g/dL (ref 6.0–8.3)

## 2013-08-02 LAB — GLUCOSE, CAPILLARY
GLUCOSE-CAPILLARY: 105 mg/dL — AB (ref 70–99)
Glucose-Capillary: 93 mg/dL (ref 70–99)
Glucose-Capillary: 107 mg/dL — ABNORMAL HIGH (ref 70–99)
Glucose-Capillary: 113 mg/dL — ABNORMAL HIGH (ref 70–99)

## 2013-08-02 LAB — OVA AND PARASITE EXAMINATION

## 2013-08-02 LAB — LIPASE, BLOOD: Lipase: 30 U/L (ref 11–59)

## 2013-08-02 MED ORDER — AZATHIOPRINE 50 MG PO TABS
50.0000 mg | ORAL_TABLET | Freq: Every day | ORAL | Status: DC
  Administered 2013-08-02 – 2013-08-03 (×2): 50 mg via ORAL
  Filled 2013-08-02 (×2): qty 1

## 2013-08-02 MED ORDER — FERROUS SULFATE 325 (65 FE) MG PO TABS
325.0000 mg | ORAL_TABLET | Freq: Two times a day (BID) | ORAL | Status: DC
  Administered 2013-08-02 – 2013-08-03 (×2): 325 mg via ORAL
  Filled 2013-08-02 (×5): qty 1

## 2013-08-02 MED ORDER — IOHEXOL 300 MG/ML  SOLN
25.0000 mL | INTRAMUSCULAR | Status: AC
  Administered 2013-08-02 (×2): 25 mL via ORAL

## 2013-08-02 MED ORDER — POTASSIUM CHLORIDE CRYS ER 20 MEQ PO TBCR
40.0000 meq | EXTENDED_RELEASE_TABLET | Freq: Two times a day (BID) | ORAL | Status: AC
  Administered 2013-08-02 (×2): 40 meq via ORAL
  Filled 2013-08-02 (×3): qty 2

## 2013-08-02 MED ORDER — MORPHINE SULFATE 2 MG/ML IJ SOLN
1.0000 mg | INTRAMUSCULAR | Status: DC | PRN
  Administered 2013-08-02 – 2013-08-03 (×7): 2 mg via INTRAVENOUS
  Filled 2013-08-02 (×7): qty 1

## 2013-08-02 NOTE — Progress Notes (Signed)
Patient ID: Benjamin Shelton, male   DOB: [birth year], 33 y.o.   MRN: [id number] [facility] PROGRESS NOTE  Jourden Gilson HUT:[id number] DOB: 1981/03/13 DOA: 07/29/2013 PCP: No PCP Per Patient  Brief narrative:  This is a 33 y.o. year old male with chron's disease s/p colectomy in the past, presenting with IBD flare. Pt was seen 2 weeks ago in the ER for IBD flare. Was put on prednisone taper. Pt states that sxs improved for a short duration. Sxs seemed to flared again over past 2 days. Was previously rxd imuran, but pt states that he cannot afford it.   On presentation to the ER, patient hemodynamically stable. Afebrile. White blood cell count 12.8. ESR 19. Hemoglobin 10.1. Sodium 131. Potassium 3.6. CT of the abdomen shows pulse surgical changes from subtotal colectomy with wall thickening in the rectum and anorectal junction with concern for proctitis. Also has 2 mm nonobstructing right renal stone.   Active Problems:  IBD (inflammatory bowel disease)  - pt reports persistent discomfort in the right upper quadrant, worse post meals - continue Cipro and Flagyl day #5 - Gi consult requested, recommendations are appreciated  Moderate malnutrition  - secondary to acute flare of chronic illness  - plan for Abd [place] in AM Hypokalemia  - continue to supplement and repeat BMP in AM Anemia of chronic disease  - place on oral and parenteral iron as recommended by GI  Hyponatremia  - secondary to dehydration  - repeat BMP in AM Hyperglycemia  - from steroids  - placed temporarily on SSI  Transaminitis  - unclear etiology  - plan for Abd [place] in AM  Consultants:  GI Procedures/Studies:  Ct Abdomen Pelvis W Contrast 07/29/2013 Postsurgical change compatible prior subtotal colectomy in this patient with known Crohn's disease. Continued evidence of wall thickening of the rectum and anorectal junction which may be due to proctitis. Moderate hepatic steatosis. Nonobstructing 2 mm right renal  stone. Postsurgical change compatible prior midline ventral hernia repair.  Dg Abd Acute W/chest 07/29/2013 Moderate gaseous distention of the remaining colon with an air-fluid level in the upper pelvis. Mild gaseous distention of adjacent loops of small bowel in the LUQ, localized ileus is favored over obstruction. No free intraperitoneal air. No acute cardiopulmonary disease.  Antibiotics:  Cipro 5/14 -->  Flagyl 5/14 -->   Code Status: Full  Family Communication: Pt at bedside  Disposition Plan: Home when medically stable   HPI/Subjective: No events overnight.   Objective: Filed Vitals:   08/01/13 0606 08/01/13 1346 08/01/13 2139 08/02/13 0511  BP: 132/75 133/75 138/79 139/76  Pulse: 74 87 70 67  Temp: 97.4 F (36.3 C) 98.2 F (36.8 C) 98 F (36.7 C) 98.5 F (36.9 C)  TempSrc: Oral Oral Oral   Resp: _0 Height:      Weight:      SpO2: 98% 100% 100% 100%    Intake/Output Summary (Last 24 hours) at 08/02/13 6568 Last data filed at 08/02/13 0511  Gross per 24 hour  Intake    600 ml  Output    600 ml  Net      0 ml    Exam:   General:  Pt is alert, follows commands appropriately, not in acute distress  Cardiovascular: Regular rate and rhythm, S1/S2, no murmurs, no rubs, no gallops  Respiratory: Clear to auscultation bilaterally, no wheezing, no crackles, no rhonchi  Abdomen: Soft, tender in right upper quadrant with voluntary guarding   Extremities: No  edema, pulses DP and PT palpable bilaterally  Neuro: Grossly nonfocal  Data Reviewed: Basic Metabolic Panel:  Recent Labs Lab 07/29/13 1706 07/30/13 0215 07/31/13 0605 08/01/13 0535 08/02/13 0520  NA 131* 138 135* 133* 136*  K 3.6* 4.4 4.2 3.9 2.7*  CL 89* 100 94* 92* 95*  CO2 _0 GLUCOSE 144* 84 127* 109* 124*  BUN 7 5* _1 CREATININE 0.69 0.70 0.72 0.71 0.85  CALCIUM 9.5 8.9 9.2 8.9 8.8  MG  --  1.6  --   --   --    Liver Function Tests:  Recent Labs Lab  07/29/13 1706 07/30/13 0215 07/31/13 0605 08/01/13 0535 08/02/13 0520  AST 143* 93* 51* 161* 142*  ALT 148* 126* 95* 127* 160*  ALKPHOS 125* 119* 125* 129* 118*  BILITOT 0.4 0.6 0.6 0.4 0.3  PROT 7.6 7.4 7.4 7.2 6.8  ALBUMIN 3.5 3.6 3.5 3.4* 3.3*    Recent Labs Lab 07/29/13 1706 08/02/13 0520  LIPASE 37 30   CBC:  Recent Labs Lab 07/29/13 1706 07/30/13 0215 07/31/13 0605 08/01/13 0535 08/02/13 0520  WBC 12.8* 11.8* 12.2* 13.1* 10.2  NEUTROABS 10.0* 10.9* 11.3* 11.6* 7.2  HGB 10.1* 10.2* 10.4* 10.5* 9.7*  HCT 31.3* 31.6* 32.9* 32.2* 31.1*  MCV 76.5* 76.9* 77.6* 76.7* 78.3  PLT 485* 490* 459* 412* 393   CBG:  Recent Labs Lab 07/31/13 2129 08/01/13 0731 08/01/13 1153 08/01/13 1703 08/01/13 2204  GLUCAP 141* 124* 134* 136* 118*    Recent Results (from the past 240 hour(s))  STOOL CULTURE     Status: None   Collection Time    07/30/13 10:33 AM      Result Value Ref Range Status   Specimen Description STOOL   Final   Special Requests NONE   Final   Culture     Final   Value: NO SUSPICIOUS COLONIES, CONTINUING TO HOLD     Performed at [facility]   Report Status PENDING   Incomplete  CLOSTRIDIUM DIFFICILE BY PCR     Status: None   Collection Time    07/30/13 11:08 AM      Result Value Ref Range Status   C difficile by pcr NEGATIVE  NEGATIVE Final     Scheduled Meds: . ciprofloxacin  500 mg Oral BID  . heparin  5,000 Units Subcutaneous 3 times per day  . insulin aspart  0-9 Units Subcutaneous TID WC  . methylPREDNISolone (SOLU-MEDROL) injection  40 mg Intravenous Daily  . metroNIDAZOLE  500 mg Oral 3 times per day   Continuous Infusions:    Theodis Blaze, MD  [facility] Pager [phone]  If 7PM-7AM, please contact night-coverage [email or web address] Password [email or web address] 08/02/2013, 7:12 AM   LOS: 4 days

## 2013-08-02 NOTE — ED Provider Notes (Signed)
Medical screening examination/treatment/procedure(s) were conducted as a shared visit with non-physician practitioner(s) and myself.  I personally evaluated the patient during the encounter.   EKG Interpretation None      Patient here with abdominal pain. Hx of Crohn's disease, feels like exacerbation. Will admit for IV steroids.  Osvaldo Shipper, MD 08/02/13 Lurline Hare

## 2013-08-02 NOTE — ED Provider Notes (Signed)
Medical screening examination/treatment/procedure(s) were conducted as a shared visit with non-physician practitioner(s) and myself.  I personally evaluated the patient during the encounter.   EKG Interpretation None      Pt with hx crohns disease. C/o similar pain, dull, crampy. No fevers. abd soft nt. Labs.   Mirna Mires, MD 08/02/13 [phone]

## 2013-08-02 NOTE — Consult Note (Signed)
[facility] Consult: 10:12 AM 08/02/2013  LOS: 4 days    Referring Provider: Dr Doyle Askew.  Primary Care Physician:  None Primary Gastroenterologist:  Althia Forts.  Previous GI Dr Starleen Arms in [place].      Reason for Consultation:  Flare of Crohns   HPI: Benjamin Shelton is a 32 y.o. male.  Hx Crohns since 42.  Colectomy with colostomy (ultimately reversed) ~ 1995.  5 separate hernia repairs.  PEG placement.  Previously on Imuran 50 mg daily, Prednisone 10 mg, Penatasa 1000 mg and Lortab once daily and with this regimen the Crohn's was well controlled. Colonoscopy in 2009 was "good".  Lost to GI follow up in 2009 when he was incarcerated (through Feb 2014).  Got refills for Imuran in Feb and April 2014 but not taking this once it ran out 3 months or so later.  His stools are loose to soft 4 to 5 per day.  He has pain in the right abdomen.  This is c/w his Crohn's disease.   Has been unable to [facility] care with Dr Starleen Arms and no MDs in [place] where he now lives with his 54 y/o niece. Medicaid application made but he has yet to hear from their office.   ED visit 4/29 for abdominal pain, N/V/D. The stools were actually unchanged, it was the right abd pain and nausea that were new and acute.    CT scan showed prior subtotal colectomy, thickening in anal and recta walls likely due to recurrent IBD.  Also seen was diffuse fatty liver.  Hgb 10.9.  LFTs elevated. AST 123, ALT 128,  Alkaline Phosphatase 126  Pt declined admission.  Given RXs for Prednisone taper (after discussing case with Dr Collene Mares), percocet and  Phenergen.  This helped briefly.    Admitted 5/14 with pain, diarrhea, vomiting.  CT scan showed ongoing rectal/anal wall thickening. Also seen was non-obstructing right renal stone. LFTs are further elevated.  No  signs of GB or ductal issues.  Initially felt better on high dose Solumedrol, 125 mg BID.  But once dose dropped to 40 mg daily, the pain (not n/v) returned. The pain is clearly in right abdomen in upper quadrant.  Pain triggered by fatty foods, milk, spicey food.    He admits to 2 shots of liquor 3 x weekly.  On oral cipro and flagyl.  No fever of chills.   No family hx of IBD.     Past Medical History  Diagnosis Date  . Crohn's disease dx'd 1991    Past Surgical History  Procedure Laterality Date  . Hernia repair      "stomach"  . Colostomy    . Partial colectomy    . Colostomy reversal      Prior to Admission medications   Medication Sig Start Date End Date Taking? Authorizing Provider  azaTHIOprine (IMURAN) 50 MG tablet Take 50 mg by mouth daily.   Yes Historical Provider, MD    Scheduled Meds: . ciprofloxacin  500 mg Oral BID  . heparin  5,000 Units Subcutaneous 3 times per  day  . insulin aspart  0-9 Units Subcutaneous TID WC  . methylPREDNISolone (SOLU-MEDROL) injection  40 mg Intravenous Daily  . metroNIDAZOLE  500 mg Oral 3 times per day  . potassium chloride  40 mEq Oral BID WC   Infusions:   PRN Meds: morphine injection, ondansetron (ZOFRAN) IV, ondansetron   Allergies as of 07/29/2013 - Review Complete 07/29/2013  Allergen Reaction Noted  . Ibuprofen Itching 05/15/2012  . Tramadol Other (See Comments) 07/14/2013    History reviewed. No pertinent family history.  History   Social History  . Marital Status: Single    Spouse Name: N/A    Number of Children: N/A  . Years of Education: N/A   Occupational History  . Not on file.   Social History Main Topics  . Smoking status: Current Every Day Smoker -- 0.25 packs/day for 23 years    Types: Cigarettes  . Smokeless tobacco: Never Used  . Alcohol Use: 1.2 oz/week    2 Shots of liquor per week  . Drug Use: No  . Sexual Activity: Yes   Other Topics Concern  . Not on file   Social History  Narrative  . No narrative on file    REVIEW OF SYSTEMS: Constitutional:  15 # weight loss ENT:  No nose bleeds Pulm:  + cough, no SOB CV:  No palpitations, no LE edema.  GU:  No hematuria, no frequency GI:  Per HPI, no dysphagia.  Tested negative for Hep B, C, HIV in 2009 and 04/2012 Heme:  Previous anemia   Transfusions: possibly but not sure Neuro:  No headaches, no peripheral tingling or numbness Derm:  No itching, no rash or sores. alll professional tatoos Endocrine:  No sweats or chills.  No polyuria or dysuria Immunization:  No queried.  Travel:  None beyond local counties in last few months.    PHYSICAL EXAM: Vital signs in last 24 hours: Filed Vitals:   08/02/13 0511  BP: 139/76  Pulse: 67  Temp: 98.5 F (36.9 C)  Resp: 17   Wt Readings from Last 3 Encounters:  07/29/13 54.2 kg (119 lb 7.8 oz)  07/14/13 56.7 kg (125 lb)  05/15/12 56.7 kg (125 lb)    General: thin, somewhat chronically ill looking AAM.  comfortable Head:  No swelling, no asymmetry  Eyes:  No icterus or pallor Ears:  Not HOH  Nose:  No congestion Mouth:  Clear, moist, good dentition Neck:  No mass, no JVD Lungs:  Clear bil.  + cough.  No SOB Heart: RRR.  No mrg Abdomen:  Soft, tender RUQ.  No guard or rebound.  BS active.  Several surgical scars all well healed.   Rectal: deferred   Musc/Skeltl: no joint swelling, no deformities Extremities:  No CCE  Neurologic:  Pleasant, alert, no tremor Skin:  No rash.  No sores or rashes.  Striae on rear of shoulders Tattoos:  Several, all professional looking Nodes:  No cervical or inguinal adenopathy   Psych:  Pleasant, cooperative.   Intake/Output from previous day: 05/17 0701 - 05/18 0700 In: 600 [P.O.:600] Out: 600 [Urine:600] Intake/Output this shift:    LAB RESULTS:  Recent Labs  07/31/13 0605 08/01/13 0535 08/02/13 0520  WBC 12.2* 13.1* 10.2  HGB 10.4* 10.5* 9.7*  HCT 32.9* 32.2* 31.1*  PLT 459* 412* 393   BMET Lab Results    Component Value Date   NA 136* 08/02/2013   NA 133* 08/01/2013   NA 135* 07/31/2013  K 2.7* 08/02/2013   K 3.9 08/01/2013   K 4.2 07/31/2013   CL 95* 08/02/2013   CL 92* 08/01/2013   CL 94* 07/31/2013   CO2 29 08/02/2013   CO2 27 08/01/2013   CO2 26 07/31/2013   GLUCOSE 124* 08/02/2013   GLUCOSE 109* 08/01/2013   GLUCOSE 127* 07/31/2013   BUN 11 08/02/2013   BUN 10 08/01/2013   BUN 8 07/31/2013   CREATININE 0.85 08/02/2013   CREATININE 0.71 08/01/2013   CREATININE 0.72 07/31/2013   CALCIUM 8.8 08/02/2013   CALCIUM 8.9 08/01/2013   CALCIUM 9.2 07/31/2013   LFT  Recent Labs  07/31/13 0605 08/01/13 0535 08/02/13 0520  PROT 7.4 7.2 6.8  ALBUMIN 3.5 3.4* 3.3*  AST 51* 161* 142*  ALT 95* 127* 160*  ALKPHOS 125* 129* 118*  BILITOT 0.6 0.4 0.3   PT/INR No results found for this basename: INR, PROTIME   Hepatitis Panel No results found for this basename: HEPBSAG, HCVAB, HEPAIGM, HEPBIGM,  in the last 72 hours C-Diff Negative on 5/15  Lipase     Component Value Date/Time   LIPASE 30 08/02/2013 0520    Drugs of Abuse  No results found for this basename: labopia, cocainscrnur, labbenz, amphetmu, thcu, labbarb     RADIOLOGY STUDIES: See HPI  ENDOSCOPIC STUDIES: Colonoscopy 2009 per pt.    IMPRESSION:   *  Crohn's disease since age 49.  Sxs historically well controlled on multidrug regimen.  Stool pattern has not changed even off all his meds.  The acute n/v/pain started acutely in 06/2013. Some evidence Crohn's of rectum on CT.  Wonder if he has UC?    *  Abnormal LFTs.  Fatty liver on CT, drinks ETOH in moderation (4 ox 3 x weekly if he is being honest). Right sided pain and trigger of fatty food raises suspicion for GB disease (not seen on CT however)  *  Anemia. Microcytic. Low iron, ferritin is 19.  *  S/p colectomy.     PLAN:     *  Cancelled CT and ordered Ultrasound for AM tomorrow.  *  Restarted Imuran 50 mg daily.  Not yet restarted was Pentasa, given rectal  disease this may not be best option for Mesalamine.  *  Hepatitis serologies.  *  Will need po iron or parenteral iron.    Vena Rua  08/02/2013, 10:12 AM Pager: [phone]      Attending physician's note   I have taken a history, examined the patient and reviewed the chart. I agree with the Advanced Practitioner's note, impression and recommendations. Complicated patient who relates a history of Crohn's, history a subtotal colectomy, colostomy, colostomy reversal, and 5 separate ventral hernia repairs. Worsening right sided abd pain and elevated LFTs.  R/O biliary disease, Crohn's flare. Abd [place] tomorrow. IV corticosteroids and Imuran for now. Needs Fe replacement PO or IV. Would request most recent GI records from Dr. Starleen Arms.  Ladene Artist, MD [facility]

## 2013-08-03 ENCOUNTER — Inpatient Hospital Stay (HOSPITAL_COMMUNITY): Payer: Self-pay

## 2013-08-03 DIAGNOSIS — R109 Unspecified abdominal pain: Secondary | ICD-10-CM

## 2013-08-03 LAB — COMPREHENSIVE METABOLIC PANEL
ALK PHOS: 99 U/L (ref 39–117)
ALT: 170 U/L — ABNORMAL HIGH (ref 0–53)
AST: 115 U/L — AB (ref 0–37)
Albumin: 2.9 g/dL — ABNORMAL LOW (ref 3.5–5.2)
BUN: 11 mg/dL (ref 6–23)
CALCIUM: 8.4 mg/dL (ref 8.4–10.5)
CO2: 28 mEq/L (ref 19–32)
Chloride: 102 mEq/L (ref 96–112)
Creatinine, Ser: 0.89 mg/dL (ref 0.50–1.35)
GFR calc non Af Amer: >90 mL/min (ref >90)
GLUCOSE: 93 mg/dL (ref 70–99)
Potassium: 3.5 mEq/L — ABNORMAL LOW (ref 3.7–5.3)
SODIUM: 140 meq/L (ref 137–147)
Total Bilirubin: 0.3 mg/dL (ref 0.3–1.2)
Total Protein: 5.9 g/dL — ABNORMAL LOW (ref 6.0–8.3)

## 2013-08-03 LAB — CBC WITH DIFFERENTIAL/PLATELET
Basophils Absolute: 0 10*3/uL (ref 0.0–0.1)
Basophils Relative: 0 % (ref 0–1)
EOS PCT: 2 % (ref 0–5)
Eosinophils Absolute: 0.2 10*3/uL (ref 0.0–0.7)
HCT: 29 % — ABNORMAL LOW (ref 39.0–52.0)
Hemoglobin: 9 g/dL — ABNORMAL LOW (ref 13.0–17.0)
Lymphocytes Relative: 20 % (ref 12–46)
Lymphs Abs: 2 10*3/uL (ref 0.7–4.0)
MCH: 24.5 pg — AB (ref 26.0–34.0)
MCHC: 31 g/dL (ref 30.0–36.0)
MCV: 79 fL (ref 78.0–100.0)
Monocytes Absolute: 1.5 10*3/uL — ABNORMAL HIGH (ref 0.1–1.0)
Monocytes Relative: 16 % — ABNORMAL HIGH (ref 3–12)
NEUTROS PCT: 62 % (ref 43–77)
Neutro Abs: 5.9 10*3/uL (ref 1.7–7.7)
PLATELETS: 396 10*3/uL (ref 150–400)
RBC: 3.67 MIL/uL — ABNORMAL LOW (ref 4.22–5.81)
RDW: 16.5 % — AB (ref 11.5–15.5)
WBC: 9.6 10*3/uL (ref 4.0–10.5)

## 2013-08-03 LAB — HEPATITIS PANEL, ACUTE
HCV Ab: NEGATIVE
HEP B S AG: NEGATIVE
Hep A IgM: NONREACTIVE
Hep B C IgM: NONREACTIVE

## 2013-08-03 LAB — STOOL CULTURE

## 2013-08-03 LAB — GLUCOSE, CAPILLARY
Glucose-Capillary: 107 mg/dL — ABNORMAL HIGH (ref 70–99)
Glucose-Capillary: 105 mg/dL — ABNORMAL HIGH (ref 70–99)

## 2013-08-03 MED ORDER — FERROUS SULFATE 325 (65 FE) MG PO TABS: 325.0000 mg | ORAL_TABLET | Freq: Two times a day (BID) | ORAL | Status: DC

## 2013-08-03 MED ORDER — PREDNISONE 10 MG PO TABS: 10.0000 mg | ORAL_TABLET | Freq: Every day | ORAL | Status: DC

## 2013-08-03 MED ORDER — AZATHIOPRINE 50 MG PO TABS: 50.0000 mg | ORAL_TABLET | Freq: Every day | ORAL | Status: DC

## 2013-08-03 MED ORDER — HYDROCODONE-ACETAMINOPHEN 5-325 MG PO TABS: 1.0000 | ORAL_TABLET | Freq: Four times a day (QID) | ORAL | Status: DC | PRN

## 2013-08-03 MED ORDER — HYDROCODONE-ACETAMINOPHEN 5-325 MG PO TABS
1.0000 | ORAL_TABLET | Freq: Four times a day (QID) | ORAL | Status: DC | PRN
  Administered 2013-08-03: 1 via ORAL
  Filled 2013-08-03: qty 1

## 2013-08-03 NOTE — Progress Notes (Signed)
Patient discharged to home with instructions. 

## 2013-08-03 NOTE — Discharge Summary (Signed)
Physician Discharge Summary  Benjamin Shelton CBS:[id number] DOB: 1981/02/03 DOA: 07/29/2013  PCP: No PCP Per Patient  Admit date: 07/29/2013 Discharge date: 08/03/2013  Recommendations for Outpatient Follow-up:  1. Pt will need to follow up with PCP in 2-3 weeks post discharge 2. Please obtain CMP to evaluate electrolytes and kidney function 3. Will need to have K level checked to ensure it remains stable and WNL   Discharge Diagnoses:  Active Problems:   IBD (inflammatory bowel disease)   Abdominal pain, other specified site   Iron deficiency anemia, unspecified  Discharge Condition: Stable  Diet recommendation: Heart healthy diet discussed in details   Brief narrative:  This is a 33 y.o. year old male with chron's disease s/p colectomy in the past, presenting with IBD flare. Pt was seen 2 weeks ago in the ER for IBD flare. Was put on prednisone taper. Pt states that sxs improved for a short duration. Sxs seemed to flared again over past 2 days. Was previously rxd imuran, but pt states that he cannot afford it.   On presentation to the ER, patient hemodynamically stable. Afebrile. White blood cell count 12.8. ESR 19. Hemoglobin 10.1. Sodium 131. Potassium 3.6. CT of the abdomen shows pulse surgical changes from subtotal colectomy with wall thickening in the rectum and anorectal junction with concern for proctitis. Also has 2 mm nonobstructing right renal stone.   Active Problems:  IBD (inflammatory bowel disease)  - pt reports persistent discomfort in the right upper quadrant, worse post meals  - continue Cipro and Flagyl day #6 but no need to continue upon discharge per GI  - recommendations are appreciated  Moderate malnutrition  - secondary to acute flare of chronic illness  - diet advanced and pt tolerating well  Hypokalemia  - continue supplement, pt wants to go home and will need to have K level checked to ensure it remains stable and WNL  Anemia of chronic disease  -  place on oral iron as recommended by GI  Hyponatremia  - secondary to dehydration  Hyperglycemia  - from steroids  - placed temporarily on SSI while inpatient  Transaminitis  - from alcohol use - pt counseled on cessation   Consultants:  GI Procedures/Studies:  Ct Abdomen Pelvis W Contrast 07/29/2013 Postsurgical change compatible prior subtotal colectomy in this patient with known Crohn's disease. Continued evidence of wall thickening of the rectum and anorectal junction which may be due to proctitis. Moderate hepatic steatosis. Nonobstructing 2 mm right renal stone. Postsurgical change compatible prior midline ventral hernia repair.  Dg Abd Acute W/chest 07/29/2013 Moderate gaseous distention of the remaining colon with an air-fluid level in the upper pelvis. Mild gaseous distention of adjacent loops of small bowel in the LUQ, localized ileus is favored over obstruction. No free intraperitoneal air. No acute cardiopulmonary disease.  Antibiotics:  Cipro 5/14 --> 5/19 Flagyl 5/14 --> 5/19  Code Status: Full  Family Communication: Pt at bedside   Discharge Exam: Filed Vitals:   08/03/13 1303  BP: 130/67  Pulse: 85  Temp: 97.5 F (36.4 C)  Resp: 17   Filed Vitals:   08/02/13 1500 08/02/13 2141 08/03/13 0619 08/03/13 1303  BP: 123/89 133/64 120/45 130/67  Pulse: 77 78 60 85  Temp: 98.1 F (36.7 C) 98.6 F (37 C) 97.9 F (36.6 C) 97.5 F (36.4 C)  TempSrc: Oral Oral Oral Oral  Resp: $Remo'18 18 19 17  'KydDD$ Height:      Weight:      SpO2:  98% 100% 100% 100%    General: Pt is alert, follows commands appropriately, not in acute distress Cardiovascular: Regular rate and rhythm, S1/S2 +, no murmurs, no rubs, no gallops Respiratory: Clear to auscultation bilaterally, no wheezing, no crackles, no rhonchi Abdominal: Soft, non tender, non distended, bowel sounds +, no guarding Extremities: no edema, no cyanosis, pulses palpable bilaterally DP and PT Neuro: Grossly nonfocal  Discharge  Instructions  Discharge Instructions   Diet - low sodium heart healthy    Complete by:  As directed      Increase activity slowly    Complete by:  As directed             Medication List         azaTHIOprine 50 MG tablet  Commonly known as:  IMURAN  Take 1 tablet (50 mg total) by mouth daily.     ferrous sulfate 325 (65 FE) MG tablet  Take 1 tablet (325 mg total) by mouth 2 (two) times daily with a meal.     HYDROcodone-acetaminophen 5-325 MG per tablet  Commonly known as:  NORCO/VICODIN  Take 1 tablet by mouth every 6 (six) hours as needed for moderate pain.     predniSONE 10 MG tablet  Commonly known as:  DELTASONE  Take 1 tablet (10 mg total) by mouth daily with breakfast.  Start taking on:  08/04/2013           Follow-up Information   Schedule an appointment as soon as possible for a visit with Faye Ramsay, MD. (As needed, If symptoms worsen, call my cell phone [phone])    Specialty:  Internal Medicine   Contact information:   [address]  [address] [phone]        The results of significant diagnostics from this hospitalization (including imaging, microbiology, ancillary and laboratory) are listed below for reference.     Microbiology: Recent Results (from the past 240 hour(s))  STOOL CULTURE     Status: None   Collection Time    07/30/13 10:33 AM      Result Value Ref Range Status   Specimen Description STOOL   Final   Special Requests NONE   Final   Culture     Final   Value: NO SALMONELLA, SHIGELLA, CAMPYLOBACTER, YERSINIA, OR E.COLI 0157:H7 ISOLATED     Performed at [facility]   Report Status 08/03/2013 FINAL   Final  OVA AND PARASITE EXAMINATION     Status: None   Collection Time    07/30/13 10:33 AM      Result Value Ref Range Status   Specimen Description STOOL   Final   Special Requests NONE   Final   Ova and parasites     Final   Value: NO OVA OR PARASITES SEEN MODERATE WBC SEEN     Performed  at [facility]   Report Status 08/02/2013 FINAL   Final  CLOSTRIDIUM DIFFICILE BY PCR     Status: None   Collection Time    07/30/13 11:08 AM      Result Value Ref Range Status   C difficile by pcr NEGATIVE  NEGATIVE Final     Labs: Basic Metabolic Panel:  Recent Labs Lab 07/29/13 1706 07/30/13 0215 07/31/13 0605 08/01/13 0535 08/02/13 0520 08/03/13 0632  NA 131* 138 135* 133* 136* 140  K 3.6* 4.4 4.2 3.9 2.7* 3.5*  CL 89* 100 94* 92* 95* 102  CO2 $Re'20 19 26 27 'mxX$ 29  28  GLUCOSE 144* 84 127* 109* 124* 93  BUN 7 5* $Re'8 10 11 11  'OEK$ CREATININE 0.69 0.70 0.72 0.71 0.85 0.89  CALCIUM 9.5 8.9 9.2 8.9 8.8 8.4  MG  --  1.6  --   --   --   --    Liver Function Tests:  Recent Labs Lab 07/30/13 0215 07/31/13 0605 08/01/13 0535 08/02/13 0520 08/03/13 0632  AST 93* 51* 161* 142* 115*  ALT 126* 95* 127* 160* 170*  ALKPHOS 119* 125* 129* 118* 99  BILITOT 0.6 0.6 0.4 0.3 0.3  PROT 7.4 7.4 7.2 6.8 5.9*  ALBUMIN 3.6 3.5 3.4* 3.3* 2.9*    Recent Labs Lab 07/29/13 1706 08/02/13 0520  LIPASE 37 30  CBC:  Recent Labs Lab 07/30/13 0215 07/31/13 0605 08/01/13 0535 08/02/13 0520 08/03/13 0632  WBC 11.8* 12.2* 13.1* 10.2 9.6  NEUTROABS 10.9* 11.3* 11.6* 7.2 5.9  HGB 10.2* 10.4* 10.5* 9.7* 9.0*  HCT 31.6* 32.9* 32.2* 31.1* 29.0*  MCV 76.9* 77.6* 76.7* 78.3 79.0  PLT 490* 459* 412* 393 396   CBG:  Recent Labs Lab 08/02/13 1149 08/02/13 1802 08/02/13 2140 08/03/13 0749 08/03/13 1148  GLUCAP 105* 107* 113* 107* 105*     SIGNED: Time coordinating discharge: Over 30 minutes  Theodis Blaze, MD  [facility] 08/03/2013, 1:43 PM Pager [phone]  If 7PM-7AM, please contact night-coverage [email or web address] Password [email or web address]

## 2013-08-03 NOTE — Progress Notes (Signed)
Attending physician's note   I have taken an interval history, reviewed the chart and examined the patient. I agree with the Advanced Practitioner's note, impression and recommendations.  Abd pain has returned to baseline. The etiology of the worsening pain it not clear-painful adhesions and Crohn's disease are the most likely causes in that order. Resume his prior regimen of Imuran and Prednisone for now. Elevated LFTs likely secondary to fatty liver. His LFT elevation is not a typical pattern for etoh hepatitis. In addition to GI follow up he need a PCP and a pain clinic referral.  Pricilla Riffle. Fuller Plan, MD [facility]

## 2013-08-03 NOTE — Progress Notes (Signed)
Daily Rounding Note  08/03/2013, 9:36 AM  LOS: 5 days   SUBJECTIVE:       Pain improved.  Tolerating solids.  Loose to soft BMs about 3 x yesterday, his baseline.  No nausea  OBJECTIVE:         Vital signs in last 24 hours:    Temp:  [97.9 F (36.6 C)-98.6 F (37 C)] 97.9 F (36.6 C) (05/19 0619) Pulse Rate:  [60-78] 60 (05/19 0619) Resp:  [18-19] 19 (05/19 0619) BP: (120-133)/(45-89) 120/45 mmHg (05/19 0619) SpO2:  [98 %-100 %] 100 % (05/19 0619) Last BM Date: 07/31/13 General: looks well   Heart: RRR Chest: clear.  No labored breathing or cough Abdomen: soft, ND, slight upper abdominal tenderness, no guard or rebound.  Active BS  Extremities: no CCE Neuro/Psych:  Pleasant, cooperative, no gross deficits.  Engaged, good historian.   Intake/Output from previous day: 05/18 0701 - 05/19 0700 In: 930 [P.O.:930] Out: 450 [Urine:450]  Intake/Output this shift:    Lab Results:  Recent Labs  08/01/13 0535 08/02/13 0520 08/03/13 0632  WBC 13.1* 10.2 9.6  HGB 10.5* 9.7* 9.0*  HCT 32.2* 31.1* 29.0*  PLT 412* 393 396   BMET  Recent Labs  08/01/13 0535 08/02/13 0520 08/03/13 0632  NA 133* 136* 140  K 3.9 2.7* 3.5*  CL 92* 95* 102  CO2 27 29 28   GLUCOSE 109* 124* 93  BUN 10 11 11   CREATININE 0.71 0.85 0.89  CALCIUM 8.9 8.8 8.4   LFT  Recent Labs  08/01/13 0535 08/02/13 0520 08/03/13 0632  PROT 7.2 6.8 5.9*  ALBUMIN 3.4* 3.3* 2.9*  AST 161* 142* 115*  ALT 127* 160* 170*  ALKPHOS 129* 118* 99  BILITOT 0.4 0.3 0.3   PT/INR No results found for this basename: LABPROT, INR,  in the last 72 hours Hepatitis Panel No results found for this basename: HEPBSAG, HCVAB, HEPAIGM, HEPBIGM,  in the last 72 hours  Studies/Results: US Abdomen Complete 08/03/2013   CLINICAL DATA:  Abdominal pain; elevated liver enzymes  EXAM: ULTRASOUND ABDOMEN COMPLETE  COMPARISON:  CT abdomen and pelvis Jul 29, 2013   FINDINGS: Gallbladder:  No gallstones or wall thickening visualized. There is no pericholecystic fluid. No sonographic Murphy sign noted.  Common bile duct:  Diameter: 4 mm. There is no intrahepatic, common hepatic, or common bile duct dilatation.  Liver:  No focal lesion identified. Liver shows diffuse increased echogenicity.  IVC:  No abnormality visualized.  Pancreas:  No mass or inflammatory focus.  Spleen:  Size and appearance within normal limits.  Right Kidney:  Length: 11.0 cm. Echogenicity within normal limits. No mass or hydronephrosis visualized.  Left Kidney:  Length: 11.3 cm. Echogenicity within normal limits. No mass or hydronephrosis visualized.  Abdominal aorta:  No aneurysm visualized.  Other findings:  No demonstrable ascites.  IMPRESSION: Diffuse increased echogenicity in the liver, consistent with hepatic steatosis. While no focal liver lesions are identified, it must be cautioned that the sensitivity of ultrasound for focal liver lesions is diminished given underlying hepatic steatosis. Study otherwise unremarkable.   Electronically Signed   By: Lowella Grip M.D.   On: 08/03/2013 09:07    ASSESMENT:   *  Crohns disease.  S/p subtotal colectomy in his teens.  CT scan with active, uncomplicated disease of anorectal region, Restarted on Imuran 5/18. Ongoing IV Solumedrol. Stool pattern at baseline, abdominal pain approaching baseline  *  Abdominal pain.  Level today is approaching his baseline, historically controlled with once daily Lortab.   *  Abnormal LFTs, pattern not c/w alcoholic hepatitis. Improved. Ultrasound confirms fatty liver. Acute hepatitis panel pending.   *  Hypokalemia, improved but persists.   *  Anemia, iron deficient. Note po Iron started.     PLAN   *  Acute hepatitis panel pending.  *  Orders placed requesting records from [facility], Dr Marylene Buerger in [place]. Fax sent but no records yet.  *  Will place pt back on Prednisone 10 mg daily, which he was taking  chronically daily before treatment interupted by prison term.  *  Advised pt to stop regular ETOH (usually 1 pint over one week), given the fatty liver. *  Stopped prn Morphine and started q 6 prn lortab.     Vena Rua  08/03/2013, 9:36 AM Pager: [phone]

## 2013-08-13 ENCOUNTER — Inpatient Hospital Stay: Payer: Self-pay | Admitting: Internal Medicine

## 2013-08-18 ENCOUNTER — Ambulatory Visit: Payer: Self-pay

## 2013-09-02 ENCOUNTER — Emergency Department (HOSPITAL_COMMUNITY)
Admission: EM | Admit: 2013-09-02 | Discharge: 2013-09-02 | Disposition: A | Discharge status: Home / Self Care | Payer: Self-pay | Attending: Emergency Medicine | Admitting: Emergency Medicine

## 2013-09-02 ENCOUNTER — Encounter (HOSPITAL_COMMUNITY): Payer: Self-pay | Admitting: Emergency Medicine

## 2013-09-02 ENCOUNTER — Emergency Department (HOSPITAL_COMMUNITY): Payer: Self-pay

## 2013-09-02 DIAGNOSIS — Z8719 Personal history of other diseases of the digestive system: Secondary | ICD-10-CM | POA: Insufficient documentation

## 2013-09-02 DIAGNOSIS — S99919A Unspecified injury of unspecified ankle, initial encounter: Principal | ICD-10-CM

## 2013-09-02 DIAGNOSIS — S99929A Unspecified injury of unspecified foot, initial encounter: Principal | ICD-10-CM

## 2013-09-02 DIAGNOSIS — IMO0002 Reserved for concepts with insufficient information to code with codable children: Secondary | ICD-10-CM | POA: Insufficient documentation

## 2013-09-02 DIAGNOSIS — Y92410 Unspecified street and highway as the place of occurrence of the external cause: Secondary | ICD-10-CM | POA: Insufficient documentation

## 2013-09-02 DIAGNOSIS — Z79899 Other long term (current) drug therapy: Secondary | ICD-10-CM | POA: Insufficient documentation

## 2013-09-02 DIAGNOSIS — S8990XA Unspecified injury of unspecified lower leg, initial encounter: Secondary | ICD-10-CM | POA: Insufficient documentation

## 2013-09-02 DIAGNOSIS — F17200 Nicotine dependence, unspecified, uncomplicated: Secondary | ICD-10-CM | POA: Insufficient documentation

## 2013-09-02 DIAGNOSIS — Y9389 Activity, other specified: Secondary | ICD-10-CM | POA: Insufficient documentation

## 2013-09-02 DIAGNOSIS — M25561 Pain in right knee: Secondary | ICD-10-CM

## 2013-09-02 MED ORDER — SODIUM CHLORIDE 0.9 % IV BOLUS (SEPSIS)
1000.0000 mL | Freq: Once | INTRAVENOUS | Status: AC
  Administered 2013-09-02: 1000 mL via INTRAVENOUS

## 2013-09-02 MED ORDER — HYDROCODONE-ACETAMINOPHEN 5-325 MG PO TABS
1.0000 | ORAL_TABLET | Freq: Once | ORAL | Status: AC
Start: 2013-09-02 — End: 2013-09-02
  Administered 2013-09-02: 1 via ORAL
  Filled 2013-09-02: qty 1

## 2013-09-02 MED ORDER — LORAZEPAM 1 MG PO TABS
1.0000 mg | ORAL_TABLET | Freq: Once | ORAL | Status: AC
  Administered 2013-09-02: 1 mg via ORAL
  Filled 2013-09-02: qty 2

## 2013-09-02 MED ORDER — HYDROCODONE-ACETAMINOPHEN 5-325 MG PO TABS
1.0000 | ORAL_TABLET | Freq: Once | ORAL | Status: AC
  Administered 2013-09-02: 1 via ORAL
  Filled 2013-09-02: qty 1

## 2013-09-02 MED ORDER — HYDROCODONE-ACETAMINOPHEN 5-325 MG PO TABS: 2.0000 | ORAL_TABLET | ORAL | Status: DC | PRN

## 2013-09-02 NOTE — ED Notes (Signed)
Pt comfortable with discharge and follow up instructions. Prescriptions x1. Pt declines wheelchair, escorted to waiting area by this RN.
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
## 2013-09-03 NOTE — ED Provider Notes (Signed)
Medical screening examination/treatment/procedure(s) were performed by non-physician practitioner and as supervising physician I was immediately available for consultation/collaboration.   EKG Interpretation None       Richarda Blade, MD 09/03/13 [phone]

## 2013-10-21 ENCOUNTER — Inpatient Hospital Stay (HOSPITAL_COMMUNITY): Payer: Self-pay

## 2013-10-21 ENCOUNTER — Inpatient Hospital Stay (HOSPITAL_COMMUNITY)
Admission: EM | Admit: 2013-10-21 | Discharge: 2013-10-23 | DRG: 387 | Disposition: A | Discharge status: Home / Self Care | Payer: Self-pay | Attending: Internal Medicine | Admitting: Internal Medicine

## 2013-10-21 ENCOUNTER — Emergency Department (HOSPITAL_COMMUNITY): Payer: Self-pay

## 2013-10-21 ENCOUNTER — Encounter (HOSPITAL_COMMUNITY): Payer: Self-pay | Admitting: Emergency Medicine

## 2013-10-21 DIAGNOSIS — G8929 Other chronic pain: Secondary | ICD-10-CM | POA: Diagnosis present

## 2013-10-21 DIAGNOSIS — K7689 Other specified diseases of liver: Secondary | ICD-10-CM | POA: Diagnosis present

## 2013-10-21 DIAGNOSIS — D509 Iron deficiency anemia, unspecified: Secondary | ICD-10-CM | POA: Diagnosis present

## 2013-10-21 DIAGNOSIS — K529 Noninfective gastroenteritis and colitis, unspecified: Secondary | ICD-10-CM | POA: Diagnosis present

## 2013-10-21 DIAGNOSIS — K50919 Crohn's disease, unspecified, with unspecified complications: Secondary | ICD-10-CM

## 2013-10-21 DIAGNOSIS — R109 Unspecified abdominal pain: Secondary | ICD-10-CM | POA: Diagnosis present

## 2013-10-21 DIAGNOSIS — Z933 Colostomy status: Secondary | ICD-10-CM

## 2013-10-21 DIAGNOSIS — Z9049 Acquired absence of other specified parts of digestive tract: Secondary | ICD-10-CM

## 2013-10-21 DIAGNOSIS — F17200 Nicotine dependence, unspecified, uncomplicated: Secondary | ICD-10-CM | POA: Diagnosis present

## 2013-10-21 DIAGNOSIS — K5090 Crohn's disease, unspecified, without complications: Principal | ICD-10-CM | POA: Diagnosis present

## 2013-10-21 DIAGNOSIS — E860 Dehydration: Secondary | ICD-10-CM | POA: Diagnosis present

## 2013-10-21 DIAGNOSIS — K6289 Other specified diseases of anus and rectum: Secondary | ICD-10-CM | POA: Diagnosis present

## 2013-10-21 LAB — COMPREHENSIVE METABOLIC PANEL
ALT: 107 U/L — AB (ref 0–53)
AST: 130 U/L — AB (ref 0–37)
Albumin: 4.1 g/dL (ref 3.5–5.2)
Alkaline Phosphatase: 112 U/L (ref 39–117)
Anion gap: 17 — ABNORMAL HIGH (ref 5–15)
BUN: 5 mg/dL — ABNORMAL LOW (ref 6–23)
CO2: 24 mEq/L (ref 19–32)
Calcium: 9.2 mg/dL (ref 8.4–10.5)
Chloride: 98 mEq/L (ref 96–112)
Creatinine, Ser: 0.66 mg/dL (ref 0.50–1.35)
GFR calc non Af Amer: >90 mL/min (ref >90)
Glucose, Bld: 79 mg/dL (ref 70–99)
Potassium: 4.2 mEq/L (ref 3.7–5.3)
SODIUM: 139 meq/L (ref 137–147)
TOTAL PROTEIN: 8.4 g/dL — AB (ref 6.0–8.3)
Total Bilirubin: 0.2 mg/dL — ABNORMAL LOW (ref 0.3–1.2)

## 2013-10-21 LAB — URINALYSIS, ROUTINE W REFLEX MICROSCOPIC
Bilirubin Urine: NEGATIVE
Glucose, UA: NEGATIVE mg/dL
HGB URINE DIPSTICK: NEGATIVE
Ketones, ur: NEGATIVE mg/dL
LEUKOCYTES UA: NEGATIVE
Nitrite: NEGATIVE
PROTEIN: NEGATIVE mg/dL
SPECIFIC GRAVITY, URINE: 1.004 — AB (ref 1.005–1.030)
UROBILINOGEN UA: 0.2 mg/dL (ref 0.0–1.0)
pH: 6.5 (ref 5.0–8.0)

## 2013-10-21 LAB — CBC WITH DIFFERENTIAL/PLATELET
Basophils Absolute: 0.1 10*3/uL (ref 0.0–0.1)
Basophils Relative: 1 % (ref 0–1)
EOS ABS: 0.1 10*3/uL (ref 0.0–0.7)
Eosinophils Relative: 1 % (ref 0–5)
HCT: 30.7 % — ABNORMAL LOW (ref 39.0–52.0)
Hemoglobin: 8.9 g/dL — ABNORMAL LOW (ref 13.0–17.0)
Lymphocytes Relative: 18 % (ref 12–46)
Lymphs Abs: 1.6 10*3/uL (ref 0.7–4.0)
MCH: 20.7 pg — ABNORMAL LOW (ref 26.0–34.0)
MCHC: 29 g/dL — ABNORMAL LOW (ref 30.0–36.0)
MCV: 71.4 fL — ABNORMAL LOW (ref 78.0–100.0)
MONOS PCT: 13 % — AB (ref 3–12)
Monocytes Absolute: 1.2 10*3/uL — ABNORMAL HIGH (ref 0.1–1.0)
NEUTROS PCT: 67 % (ref 43–77)
Neutro Abs: 6.1 10*3/uL (ref 1.7–7.7)
PLATELETS: 554 10*3/uL — AB (ref 150–400)
RBC: 4.3 MIL/uL (ref 4.22–5.81)
RDW: 19.1 % — ABNORMAL HIGH (ref 11.5–15.5)
WBC: 9.1 10*3/uL (ref 4.0–10.5)

## 2013-10-21 LAB — C-REACTIVE PROTEIN: CRP: <0.5 mg/dL — ABNORMAL LOW (ref <0.60)

## 2013-10-21 LAB — LIPASE, BLOOD: LIPASE: 44 U/L (ref 11–59)

## 2013-10-21 MED ORDER — HYDROMORPHONE HCL PF 1 MG/ML IJ SOLN
1.0000 mg | Freq: Once | INTRAMUSCULAR | Status: AC
  Administered 2013-10-21: 1 mg via INTRAVENOUS
  Filled 2013-10-21: qty 1

## 2013-10-21 MED ORDER — SODIUM CHLORIDE 0.9 % IV BOLUS (SEPSIS)
1000.0000 mL | Freq: Once | INTRAVENOUS | Status: AC
  Administered 2013-10-21: 1000 mL via INTRAVENOUS

## 2013-10-21 MED ORDER — IOHEXOL 300 MG/ML  SOLN
80.0000 mL | Freq: Once | INTRAMUSCULAR | Status: AC | PRN
  Administered 2013-10-21: 80 mL via INTRAVENOUS

## 2013-10-21 MED ORDER — ENOXAPARIN SODIUM 40 MG/0.4ML ~~LOC~~ SOLN
40.0000 mg | SUBCUTANEOUS | Status: DC
  Filled 2013-10-21 (×3): qty 0.4

## 2013-10-21 MED ORDER — CIPROFLOXACIN IN D5W 400 MG/200ML IV SOLN
400.0000 mg | Freq: Two times a day (BID) | INTRAVENOUS | Status: DC
  Administered 2013-10-21 – 2013-10-23 (×4): 400 mg via INTRAVENOUS
  Filled 2013-10-21 (×6): qty 200

## 2013-10-21 MED ORDER — FOLIC ACID 5 MG/ML IJ SOLN
1.0000 mg | Freq: Every day | INTRAMUSCULAR | Status: DC
  Administered 2013-10-21 – 2013-10-22 (×2): 1 mg via INTRAVENOUS
  Filled 2013-10-21 (×4): qty 0.2

## 2013-10-21 MED ORDER — ONDANSETRON HCL 4 MG/2ML IJ SOLN
4.0000 mg | Freq: Four times a day (QID) | INTRAMUSCULAR | Status: DC | PRN
  Administered 2013-10-21 – 2013-10-23 (×6): 4 mg via INTRAVENOUS
  Filled 2013-10-21 (×6): qty 2

## 2013-10-21 MED ORDER — ACETAMINOPHEN 325 MG PO TABS: 650.0000 mg | ORAL_TABLET | Freq: Four times a day (QID) | ORAL | Status: DC | PRN

## 2013-10-21 MED ORDER — THIAMINE HCL 100 MG/ML IJ SOLN
100.0000 mg | Freq: Every day | INTRAMUSCULAR | Status: DC
  Administered 2013-10-21 – 2013-10-22 (×2): 100 mg via INTRAVENOUS
  Filled 2013-10-21 (×2): qty 1
  Filled 2013-10-21: qty 2

## 2013-10-21 MED ORDER — HYDROMORPHONE HCL PF 1 MG/ML IJ SOLN
1.0000 mg | INTRAMUSCULAR | Status: DC | PRN
  Administered 2013-10-21 – 2013-10-23 (×15): 1 mg via INTRAVENOUS
  Filled 2013-10-21 (×15): qty 1

## 2013-10-21 MED ORDER — OXYCODONE HCL 5 MG PO TABS
5.0000 mg | ORAL_TABLET | ORAL | Status: DC | PRN
  Administered 2013-10-21 – 2013-10-23 (×8): 5 mg via ORAL
  Filled 2013-10-21 (×8): qty 1

## 2013-10-21 MED ORDER — ACETAMINOPHEN 650 MG RE SUPP: 650.0000 mg | Freq: Four times a day (QID) | RECTAL | Status: DC | PRN

## 2013-10-21 MED ORDER — METHYLPREDNISOLONE SODIUM SUCC 125 MG IJ SOLR
125.0000 mg | Freq: Once | INTRAMUSCULAR | Status: AC
  Administered 2013-10-21: 125 mg via INTRAVENOUS
  Filled 2013-10-21: qty 2

## 2013-10-21 MED ORDER — ONDANSETRON HCL 4 MG/2ML IJ SOLN
4.0000 mg | Freq: Once | INTRAMUSCULAR | Status: AC
  Administered 2013-10-21: 4 mg via INTRAVENOUS
  Filled 2013-10-21: qty 2

## 2013-10-21 MED ORDER — ONDANSETRON HCL 4 MG PO TABS: 4.0000 mg | ORAL_TABLET | Freq: Four times a day (QID) | ORAL | Status: DC | PRN

## 2013-10-21 MED ORDER — METHYLPREDNISOLONE SODIUM SUCC 40 MG IJ SOLR
40.0000 mg | Freq: Two times a day (BID) | INTRAMUSCULAR | Status: DC
  Administered 2013-10-21 – 2013-10-23 (×4): 40 mg via INTRAVENOUS
  Filled 2013-10-21 (×6): qty 1

## 2013-10-21 MED ORDER — METRONIDAZOLE IN NACL 5-0.79 MG/ML-% IV SOLN
500.0000 mg | Freq: Three times a day (TID) | INTRAVENOUS | Status: DC
  Administered 2013-10-21 – 2013-10-23 (×6): 500 mg via INTRAVENOUS
  Filled 2013-10-21 (×8): qty 100

## 2013-10-21 MED ORDER — SODIUM CHLORIDE 0.9 % IV SOLN
INTRAVENOUS | Status: DC
  Administered 2013-10-21 – 2013-10-23 (×4): via INTRAVENOUS

## 2013-10-21 NOTE — Consult Note (Signed)
Unassigned Consult  Reason for Consult: Crohn's disease Referring Physician: ER  [name] HPI: This is a 33 year old male with a PMH of Crohn's disease (dx 49)  s/p subtotal colectomy in 1995 at [facility] admitted to the hospital with complaints of abdominal pain. He states that his pain abruptly increased yesterday and he was drinking beers with his brother.  With the pain he started to have an increase in his diarrhea bowel movements.  No hematochezia.  In the past he was treated with Imuran 50 mg QD, prednisone, and Pentasa.  The patient is poorly compliant and he has not obtained any of his needed medications. He states that his last colonoscopy was in 2009.  He was in the hospital last year and evaluated by Dr. Fuller Plan.  The patient never followed up after his inpatient hospitalization.  The last colonoscopy was in 2009 and he was subsequently incarcerated between 2009 and 2014.  The patient is being admitted as his pain has not improved with dilaudid.    Past Medical History  Diagnosis Date  . Crohn's disease dx'd 1991    Past Surgical History  Procedure Laterality Date  . Hernia repair      "stomach"  . Colostomy    . Partial colectomy    . Colostomy reversal      History reviewed. No pertinent family history.  Social History:  reports that he has been smoking Cigarettes.  He has a 5.75 pack-year smoking history. He has never used smokeless tobacco. He reports that he drinks about 1.2 ounces of alcohol per week. He reports that he does not use illicit drugs.  Allergies:  Allergies  Allergen Reactions  . Ibuprofen Itching  . Tramadol Other (See Comments)    irritates chrons     Medications:  Scheduled:  Continuous: . sodium chloride 1,000 mL (10/21/13 1556)    Results for orders placed during the hospital encounter of 10/21/13 (from the past 24 hour(s))  CBC WITH DIFFERENTIAL     Status: Abnormal   Collection Time    10/21/13  1:52 PM      Result Value Ref Range    WBC 9.1  4.0 - 10.5 K/uL   RBC 4.30  4.22 - 5.81 MIL/uL   Hemoglobin 8.9 (*) 13.0 - 17.0 g/dL   HCT 30.7 (*) 39.0 - 52.0 %   MCV 71.4 (*) 78.0 - 100.0 fL   MCH 20.7 (*) 26.0 - 34.0 pg   MCHC 29.0 (*) 30.0 - 36.0 g/dL   RDW 19.1 (*) 11.5 - 15.5 %   Platelets 554 (*) 150 - 400 K/uL   Neutrophils Relative % 67  43 - 77 %   Lymphocytes Relative 18  12 - 46 %   Monocytes Relative 13 (*) 3 - 12 %   Eosinophils Relative 1  0 - 5 %   Basophils Relative 1  0 - 1 %   Neutro Abs 6.1  1.7 - 7.7 K/uL   Lymphs Abs 1.6  0.7 - 4.0 K/uL   Monocytes Absolute 1.2 (*) 0.1 - 1.0 K/uL   Eosinophils Absolute 0.1  0.0 - 0.7 K/uL   Basophils Absolute 0.1  0.0 - 0.1 K/uL   RBC Morphology TARGET CELLS     WBC Morphology ATYPICAL LYMPHOCYTES     Smear Review LARGE PLATELETS PRESENT    COMPREHENSIVE METABOLIC PANEL     Status: Abnormal   Collection Time    10/21/13  1:52 PM  Result Value Ref Range   Sodium 139  137 - 147 mEq/L   Potassium 4.2  3.7 - 5.3 mEq/L   Chloride 98  96 - 112 mEq/L   CO2 24  19 - 32 mEq/L   Glucose, Bld 79  70 - 99 mg/dL   BUN 5 (*) 6 - 23 mg/dL   Creatinine, Ser 0.66  0.50 - 1.35 mg/dL   Calcium 9.2  8.4 - 10.5 mg/dL   Total Protein 8.4 (*) 6.0 - 8.3 g/dL   Albumin 4.1  3.5 - 5.2 g/dL   AST 130 (*) 0 - 37 U/L   ALT 107 (*) 0 - 53 U/L   Alkaline Phosphatase 112  39 - 117 U/L   Total Bilirubin 0.2 (*) 0.3 - 1.2 mg/dL   GFR calc non Af Amer >90  >90 mL/min   GFR calc Af Amer >90  >90 mL/min   Anion gap 17 (*) 5 - 15  LIPASE, BLOOD     Status: None   Collection Time    10/21/13  1:52 PM      Result Value Ref Range   Lipase 44  11 - 59 U/L  URINALYSIS, ROUTINE W REFLEX MICROSCOPIC     Status: Abnormal   Collection Time    10/21/13  3:53 PM      Result Value Ref Range   Color, Urine YELLOW  YELLOW   APPearance CLEAR  CLEAR   Specific Gravity, Urine 1.004 (*) 1.005 - 1.030   pH 6.5  5.0 - 8.0   Glucose, UA NEGATIVE  NEGATIVE mg/dL   Hgb urine dipstick NEGATIVE   NEGATIVE   Bilirubin Urine NEGATIVE  NEGATIVE   Ketones, ur NEGATIVE  NEGATIVE mg/dL   Protein, ur NEGATIVE  NEGATIVE mg/dL   Urobilinogen, UA 0.2  0.0 - 1.0 mg/dL   Nitrite NEGATIVE  NEGATIVE   Leukocytes, UA NEGATIVE  NEGATIVE     Dg Abd Acute W/chest  10/21/2013   CLINICAL DATA:  Right-sided abdominal pain. Cough. History of Crohn's disease. Previous subtotal colectomy.  EXAM: ACUTE ABDOMEN SERIES (ABDOMEN 2 VIEW & CHEST 1 VIEW)  COMPARISON:  07/29/2013  FINDINGS: Heart and lungs appear normal. No free air in the abdomen. There is air in nondistended bowel loops. There is slight mucosal prominence of the small bowel loops left mid abdomen. There is air in the colonic remnant in the left lower quadrant, unchanged since prior exams.  There is a moderate lumbar scoliosis and pelvic tilt.  IMPRESSION: Slight edema in the mucosa of small bowel loops in the left mid abdomen consistent with the history of Crohn's disease.   Electronically Signed   By: Rozetta Nunnery M.D.   On: 10/21/2013 15:44    ROS:  As stated above in the HPI otherwise negative.  Blood pressure 137/67, pulse 103, temperature 98.4 F (36.9 C), temperature source Oral, resp. rate 15, SpO2 95.00%.    PE: Gen: NAD, Alert and Oriented HEENT:  Level Green/AT, EOMI Neck: Supple, no LAD Lungs: CTA Bilaterally CV: RRR without M/G/R ABM: Soft, tender in the right side, +BS Ext: No C/C/E  Assessment/Plan: 1) ? Crohn's flare. 2) ABM pain.   The patient appears to be a Crohn's flare.  This is the first time that it has flared when he drank beer.  The patient has not been able to obtain follow up and he states that he called Dr. Fuller Plan last year.  Unfortunately nobody ever called him back.  His current  KUB is negative for any evidence of obstruction.  Plan: 1) Agree with steroids. 2) Check a CRP. 3) IV hydration.  Amoreena Neubert D 10/21/2013, 4:22 PM

## 2013-10-21 NOTE — ED Notes (Signed)
Returned from ct 

## 2013-10-21 NOTE — H&P (Signed)
[facility] History and Physical  Benjamin Shelton PJK:[id number] DOB: [birth year] DOA: 10/21/2013  Referring physician: Dr Darl Householder.  PCP: No PCP Per Patient   Chief Complaint: Abdominal pain.   HPI: Benjamin Shelton is a 33 y.o. 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 He relates diarrhea, loose stool that started day prior to admission, also vomiting, 2 episode day prior to admission, food content. He relates worsening of his chronic right side abdominal pain the night prior to admission. His rates abdominal pain 8/10. His chronic pain is usually around 3/10. He relates his pain got worse after he was drinking alcohol yesterday.     Review of Systems:  Negative, except as per HPI.   Past Medical History  Diagnosis Date  . Crohn's disease dx'd 1991   Past Surgical History  Procedure Laterality Date  . Hernia repair      "stomach"  . Colostomy    . Partial colectomy    . Colostomy reversal     Social History:  reports that he has been smoking Cigarettes.  He has a 5.75 pack-year smoking history. He has never used smokeless tobacco. He reports that he drinks about 1.2 ounces of alcohol per week. He reports that he does not use illicit drugs.  Allergies  Allergen Reactions  . Ibuprofen Itching  . Tramadol Other (See Comments)    irritates chrons    Family History: no family history of IBD.   Prior to Admission medications   Medication Sig Start Date End Date Taking? Authorizing Provider  azaTHIOprine (IMURAN) 50 MG tablet Take 1 tablet (50 mg total) by mouth daily. 08/03/13  Yes Theodis Blaze, MD   Physical Exam: Filed Vitals:   10/21/13 1424 10/21/13 1430 10/21/13 1445 10/21/13 1500  BP:  124/60 130/79 137/67  Pulse:  112 112 103  Temp:  98.4 F (36.9 C)     TempSrc: Oral     Resp:  21 19 15   SpO2:  100% 96% 95%    Wt Readings from Last 3 Encounters:  07/29/13 54.2 kg (119 lb 7.8 oz)  07/14/13 56.7 kg (125 lb)  05/15/12 56.7 kg (125 lb)    General:  Appears calm and comfortable, thin appearing.  Eyes: PERRL, normal lids, irises & conjunctiva ENT: grossly normal hearing, lips & tongue dry.  Neck: no LAD, masses or thyromegaly Cardiovascular: RRR, no m/r/g. No LE edema. Telemetry: SR, no arrhythmias  Respiratory: CTA bilaterally, no w/r/r. Normal respiratory effort. Abdomen: soft, right lower quadrant tenderness, no rigidity mild guarding.  Skin: no rash or induration seen on limited exam Musculoskeletal: grossly normal tone BUE/BLE Psychiatric: grossly normal mood and affect, speech fluent and appropriate Neurologic: grossly non-focal.          Labs on Admission:  Basic Metabolic Panel:  Recent Labs Lab 10/21/13 1352  NA 139  K 4.2  CL 98  CO2 24  GLUCOSE 79  BUN 5*  CREATININE 0.66  CALCIUM 9.2   Liver Function Tests:  Recent Labs Lab 10/21/13 1352  AST 130*  ALT 107*  ALKPHOS 112  BILITOT 0.2*  PROT 8.4*  ALBUMIN 4.1    Recent Labs Lab 10/21/13 1352  LIPASE 44   No results found for this basename: AMMONIA,  in the last 168 hours  CBC:  Recent Labs Lab 10/21/13 1352  WBC 9.1  NEUTROABS 6.1  HGB 8.9*  HCT 30.7*  MCV 71.4*  PLT 554*   Cardiac Enzymes: No results found for this basename: CKTOTAL, CKMB, CKMBINDEX, TROPONINI,  in the last 168 hours  BNP (last 3 results) No results found for this basename: PROBNP,  in the last 8760 hours CBG: No results found for this basename: GLUCAP,  in the last 168 hours  Radiological Exams on Admission: Dg Abd Acute W/chest  10/21/2013   CLINICAL DATA:  Right-sided abdominal pain. Cough. History of Crohn's disease. Previous subtotal colectomy.  EXAM: ACUTE ABDOMEN SERIES (ABDOMEN 2 VIEW & CHEST 1 VIEW)  COMPARISON:  07/29/2013   FINDINGS: Heart and lungs appear normal. No free air in the abdomen. There is air in nondistended bowel loops. There is slight mucosal prominence of the small bowel loops left mid abdomen. There is air in the colonic remnant in the left lower quadrant, unchanged since prior exams.  There is a moderate lumbar scoliosis and pelvic tilt.  IMPRESSION: Slight edema in the mucosa of small bowel loops in the left mid abdomen consistent with the history of Crohn's disease.   Electronically Signed   By: Rozetta Nunnery M.D.   On: 10/21/2013 15:44    EKG: none available.   Assessment/Plan Active Problems:   IBD (inflammatory bowel disease)   Abdominal pain, other specified site   Iron deficiency anemia, unspecified   1-Abdominal Pain: Probably related to IBD flare. Will check GI pathogen and C diff, due to diarrhea. Agree with Ct abdomen and pelvis. I will start IV flagyl and ciprofloxacin. Will continue with IV solumedrol 40 Mg IV BID. GI consulted. IV Zofran, IV dilaudid.   2-Transaminases; in setting recent alcohol intake. Last admission [place] only showed hepatic steatosis. Hepatitis viral panel last admission was negative. I will check HIV. Follow LFT trend.   3-Iron deficiency Anemia; will start Oral iron when able to tolerate diet. 2 Months ago iron was at 11 ferritin at 19. Hb baseline 9 to 10.   4-Alcohol intake; denies regular intake. Will start thiamine and folate.    Code Status: Presume Full Code.  DVT Prophylaxis:Lovenox.  Family Communication: Care discussed with patient.  Disposition Plan: expect 3 to 4 days inpatient.   Time spent: 75 minutes.   Niel Hummer A [facility] Pager [phone]  **Disclaimer: This note may have been dictated with voice recognition software. Similar sounding words can inadvertently be transcribed and this note may contain transcription errors which may not have been corrected upon publication of note.**

## 2013-10-21 NOTE — ED Provider Notes (Signed)
I saw and evaluated the patient, reviewed the resident's note and I agree with the findings and plan.   EKG Interpretation   Date/Time:  Thursday October 21 2013 16:58:55 EDT Ventricular Rate:  88 PR Interval:  135 QRS Duration: 93 QT Interval:  385 QTC Calculation: 466 R Axis:   85 Text Interpretation:  Sinus rhythm Consider left ventricular hypertrophy  No previous ECGs available Confirmed by Alisea Matte  MD, Brenn Deziel ([id number]) on 10/21/2013  5:46:54 PM      Benjamin Shelton is a 33 y.o. male hx of crohn's with partial colectomy here with abdominal pain. Drank some beers yesterday then had worsening right sided abdominal pain. He was admitted in May for crohn's flare and finished a course of steroids. He was on imuran but ran out and hasn't seen GI and doesn't have PMD. No fevers, has vomiting, diarrhea. Tachycardic on exam. Mild ab distention, mild diffuse tenderness, no rebound. Xray showed no SBO. Still has pain after 2 rounds of pain meds and appears dehydrated. I ordered steroids. GI, Dr. Benson Norway, called and will follow inpatient. Will admit to medicine.    Wandra Arthurs, MD 10/21/13 [phone]

## 2013-10-21 NOTE — ED Notes (Signed)
PT TO XRAY

## 2013-10-21 NOTE — ED Provider Notes (Signed)
CSN: [id number]     Arrival date & time 10/21/13  1350 History   First MD Initiated Contact with Patient 10/21/13 1355     Chief Complaint  Patient presents with  . Abdominal Pain    HPI  Benjamin Shelton is a 33 yo M with h/o Crohn's (s/p partial colectomy and recent admission in May 2015 for Crohn's flare) who presents with right sided abdominal that started after he drank four beers yesterday evening.   He reports that the pain is sharp, 8/10, non-radiating, and was accompanied by NBNB vomiting and diarrhea. He says that the pain and symptoms have been constant since last night until the afternoon today when he was brought in by EMS. He states that he rarely drinks alcohol and drank more than he usually does last night because his brother was in town visiting. He states that his symptoms are similar to those that he has had with his previous Crohn's flares. He reports a history of previous hernias associated with his abdominal surgeries, but has not noticed any new bulges along his abdominal wall. He also denies any paroxysmal pain and denies any hematuria.   He also reports a non-productive cough that has lasted for a few days, preceding the onset of his other symptoms. He reports that his sister was diagnosed with a walking pneumonia.   Past Medical History  Diagnosis Date  . Crohn's disease dx'd 1991   Past Surgical History  Procedure Laterality Date  . Hernia repair      "stomach"  . Colostomy    . Partial colectomy    . Colostomy reversal     History reviewed. No pertinent family history. History  Substance Use Topics  . Smoking status: Current Every Day Smoker -- 0.25 packs/day for 23 years    Types: Cigarettes  . Smokeless tobacco: Never Used  . Alcohol Use: 1.2 oz/week    2 Shots of liquor per week    Review of Systems  Constitutional: Negative for fever and chills.  Respiratory: Positive for cough. Negative for chest tightness.   Gastrointestinal: Positive for nausea,  vomiting, abdominal pain and diarrhea. Negative for blood in stool.  Genitourinary: Negative for dysuria and hematuria.  Musculoskeletal: Negative for back pain.      Allergies  Ibuprofen and Tramadol  Home Medications   Prior to Admission medications   Medication Sig Start Date End Date Taking? Authorizing Provider  azaTHIOprine (IMURAN) 50 MG tablet Take 1 tablet (50 mg total) by mouth daily. 08/03/13  Yes Theodis Blaze, MD   BP 132/76  Pulse 96  Temp(Src) 98.4 F (36.9 C) (Oral)  Resp 11  SpO2 93% Physical Exam  Constitutional: He is oriented to person, place, and time. He appears well-developed and well-nourished. He appears distressed ( in moderate pain).  HENT:  Head: Normocephalic and atraumatic.  Eyes: EOM are normal. Pupils are equal, round, and reactive to light.  Cardiovascular: Regular rhythm.  Exam reveals no gallop and no friction rub.   No murmur heard. Tachycardic  Pulmonary/Chest: Effort normal and breath sounds normal. He has no wheezes. He has no rales.  Abdominal: Soft. He exhibits no distension and no mass. There is tenderness ( RUQ and LUQ). There is guarding. There is no rebound.  Musculoskeletal: He exhibits no edema and no tenderness.  Neurological: He is alert and oriented to person, place, and time.    ED Course  Procedures (including critical care time) Labs Review Labs Reviewed  CBC WITH DIFFERENTIAL -  Abnormal; Notable for the following:    Hemoglobin 8.9 (*)    HCT 30.7 (*)    MCV 71.4 (*)    MCH 20.7 (*)    MCHC 29.0 (*)    RDW 19.1 (*)    Platelets 554 (*)    Monocytes Relative 13 (*)    Monocytes Absolute 1.2 (*)    All other components within normal limits  COMPREHENSIVE METABOLIC PANEL - Abnormal; Notable for the following:    BUN 5 (*)    Total Protein 8.4 (*)    AST 130 (*)    ALT 107 (*)    Total Bilirubin 0.2 (*)    Anion gap 17 (*)    All other components within normal limits  URINALYSIS, ROUTINE W REFLEX MICROSCOPIC -  Abnormal; Notable for the following:    Specific Gravity, Urine 1.004 (*)    All other components within normal limits  CLOSTRIDIUM DIFFICILE BY PCR  LIPASE, BLOOD  GI PATHOGEN PANEL BY PCR, STOOL  HIV ANTIBODY (ROUTINE TESTING)    Imaging Review Dg Abd Acute W/chest  10/21/2013   CLINICAL DATA:  Right-sided abdominal pain. Cough. History of Crohn's disease. Previous subtotal colectomy.  EXAM: ACUTE ABDOMEN SERIES (ABDOMEN 2 VIEW & CHEST 1 VIEW)  COMPARISON:  07/29/2013  FINDINGS: Heart and lungs appear normal. No free air in the abdomen. There is air in nondistended bowel loops. There is slight mucosal prominence of the small bowel loops left mid abdomen. There is air in the colonic remnant in the left lower quadrant, unchanged since prior exams.  There is a moderate lumbar scoliosis and pelvic tilt.  IMPRESSION: Slight edema in the mucosa of small bowel loops in the left mid abdomen consistent with the history of Crohn's disease.   Electronically Signed   By: Rozetta Nunnery M.D.   On: 10/21/2013 15:44     EKG Interpretation   Date/Time:  Thursday October 21 2013 16:58:55 EDT Ventricular Rate:  88 PR Interval:  135 QRS Duration: 93 QT Interval:  385 QTC Calculation: 466 R Axis:   85 Text Interpretation:  Sinus rhythm Consider left ventricular hypertrophy  No previous ECGs available Confirmed by YAO  MD, DAVID ([id number]) on 10/21/2013  5:46:54 PM      MDM   Final diagnoses:  Crohn disease, unspecified complication  Dehydration   Patient presenting with right sided abdominal pain associated with nausea and vomiting after ingestion of four beers. S/p partial colectomy c/b hernia s/p hernia repairs. DDx wide at this point and includes crohn's flare, pancreatitis, strangulated hernia (including internal), nephrolithiasis, SBO. Patient with recent admission for Crohn's flare. Will obtain CT A/P.   3:30 PM  - Lipase negative.  4:37PM  - Abd x-ray with no evidence of obstruction or free  air. Changes consistent with Crohn's flare.   - No evidence of hematuria on UA.    5:28 PM Pending CT A/P. Ddx concerning at this point for crohn's flare vs SBO. Pain still persistent despite analgesia. Admit to general medicine.     Luan Moore, MD 10/21/13 [phone]

## 2013-10-21 NOTE — ED Notes (Signed)
C/o rt side abd pain with diarrhea and nausea since yesterday after drinking alcohol and has been out of meds for chrons for 2 weeks

## 2013-10-21 NOTE — ED Notes (Signed)
Patient transported to X-ray 

## 2013-10-21 NOTE — ED Notes (Signed)
Pt waiting for c-t 

## 2013-10-22 LAB — COMPREHENSIVE METABOLIC PANEL
ALT: 92 U/L — AB (ref 0–53)
AST: 81 U/L — AB (ref 0–37)
Albumin: 4 g/dL (ref 3.5–5.2)
Alkaline Phosphatase: 119 U/L — ABNORMAL HIGH (ref 39–117)
Anion gap: 24 — ABNORMAL HIGH (ref 5–15)
BILIRUBIN TOTAL: 0.5 mg/dL (ref 0.3–1.2)
BUN: 7 mg/dL (ref 6–23)
CHLORIDE: 93 meq/L — AB (ref 96–112)
CO2: 19 meq/L (ref 19–32)
Calcium: 9.5 mg/dL (ref 8.4–10.5)
Creatinine, Ser: 0.64 mg/dL (ref 0.50–1.35)
Glucose, Bld: 134 mg/dL — ABNORMAL HIGH (ref 70–99)
Potassium: 4.1 mEq/L (ref 3.7–5.3)
SODIUM: 136 meq/L — AB (ref 137–147)
Total Protein: 8.2 g/dL (ref 6.0–8.3)

## 2013-10-22 LAB — HIV ANTIBODY (ROUTINE TESTING W REFLEX): HIV: NONREACTIVE

## 2013-10-22 LAB — CBC
HCT: 30.6 % — ABNORMAL LOW (ref 39.0–52.0)
Hemoglobin: 8.6 g/dL — ABNORMAL LOW (ref 13.0–17.0)
MCH: 20.1 pg — AB (ref 26.0–34.0)
MCHC: 28.1 g/dL — ABNORMAL LOW (ref 30.0–36.0)
MCV: 71.5 fL — ABNORMAL LOW (ref 78.0–100.0)
Platelets: 664 10*3/uL — ABNORMAL HIGH (ref 150–400)
RBC: 4.28 MIL/uL (ref 4.22–5.81)
RDW: 19 % — ABNORMAL HIGH (ref 11.5–15.5)
WBC: 9.1 10*3/uL (ref 4.0–10.5)

## 2013-10-22 NOTE — Progress Notes (Signed)
[facility] PROGRESS NOTE  Benjamin Shelton XBJ:[id number] DOB: 1980/10/28 DOA: 10/21/2013 PCP: No PCP Per Patient  Assessment/Plan: Active Problems:   IBD (inflammatory bowel disease)   Abdominal pain, other specified site   Iron deficiency anemia, unspecified   Inflammatory bowel diseases (IBD)    Crohn's exacerbation Continue empiric antibiotics IV steroids CRP normal No evidence of obstruction Advance diet as tolerated  2-Transaminases; in setting recent alcohol intake. Last admission [place] only showed hepatic steatosis. Hepatitis viral panel last admission was negative. I will check HIV. Follow LFT trend.  3-Iron deficiency Anemia; will start Oral iron when able to tolerate diet. 2 Months ago iron was at 11 ferritin at 19. Hb baseline 9 to 10.  4-Alcohol intake; denies regular intake. Will start thiamine and folate.   Code Status: full Family Communication: family updated about patient's clinical progress Disposition Plan:  As above    Brief narrative: Benjamin Shelton is a 33 y.o. 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 He relates diarrhea, loose stool that started day prior to admission, also vomiting, 2 episode day prior to admission, food content. He relates worsening of his chronic right side abdominal pain the night prior to admission. His rates abdominal pain 8/10. His chronic pain is usually around 3/10. He relates his pain got worse after he was drinking alcohol yesterday.    Consultants:  Gastroenterology  Procedures:  None  Antibiotics:  Ciprofloxacin and Flagyl  HPI/Subjective: Requesting to advance his diet  Objective: Filed Vitals:   10/21/13 1815 10/21/13 1925 10/21/13 2208 10/22/13 0654  BP: 162/99 165/90 163/87  131/91  Pulse: 110 101 100 95  Temp:  97.7 F (36.5 C) 98.3 F (36.8 C) 98.6 F (37 C)  TempSrc:  Oral Oral Oral  Resp: 20 20 20 19   Height:  5' 3.5" (1.613 m)    Weight:  56.7 kg (125 lb)    SpO2: 100% 98% 98% 99%    Intake/Output Summary (Last 24 hours) at 10/22/13 1058 Last data filed at 10/22/13 0528  Gross per 24 hour  Intake   1199 ml  Output   2075 ml  Net   -876 ml    Exam:  General: alert & oriented x 3 In NAD  Cardiovascular: RRR, nl S1 s2  Respiratory: Decreased breath sounds at the bases, scattered rhonchi, no crackles  Abdomen: soft +BS NT/ND, multiple scars Extremities: No cyanosis and no edema      Data Reviewed: Basic Metabolic Panel:  Recent Labs Lab 10/21/13 1352 10/22/13 0640  NA 139 136*  K 4.2 4.1  CL 98 93*  CO2 24 19  GLUCOSE 79 134*  BUN 5* 7  CREATININE 0.66 0.64  CALCIUM 9.2 9.5    Liver Function Tests:  Recent Labs Lab 10/21/13 1352 10/22/13 0640  AST 130* 81*  ALT 107* 92*  ALKPHOS 112 119*  BILITOT 0.2* 0.5  PROT 8.4* 8.2  ALBUMIN 4.1 4.0    Recent Labs Lab 10/21/13 1352  LIPASE 44   No results found for this basename: AMMONIA,  in the last 168 hours  CBC:  Recent Labs Lab 10/21/13 1352 10/22/13 0640  WBC 9.1 9.1  NEUTROABS 6.1  --   HGB 8.9* 8.6*  HCT 30.7* 30.6*  MCV 71.4* 71.5*  PLT 554* 664*  Cardiac Enzymes: No results found for this basename: CKTOTAL, CKMB, CKMBINDEX, TROPONINI,  in the last 168 hours BNP (last 3 results) No results found for this basename: PROBNP,  in the last 8760 hours   CBG: No results found for this basename: GLUCAP,  in the last 168 hours  No results found for this or any previous visit (from the past 240 hour(s)).   Studies: Ct Abdomen Pelvis W Contrast  10/21/2013   CLINICAL DATA:  Right-sided abdominal pain with nausea vomiting and diarrhea, with history of Crohn's disease and partial colon resection  EXAM: CT ABDOMEN AND PELVIS WITH CONTRAST  TECHNIQUE:  Multidetector CT imaging of the abdomen and pelvis was performed using the standard protocol following bolus administration of intravenous contrast.  CONTRAST:  60mL OMNIPAQUE IOHEXOL 300 MG/ML  SOLN  COMPARISON:  10/21/2013 plain films, 07/29/2013 CT scan  FINDINGS: Severe hepatic steatosis. Gallbladder is normal. Spleen is normal. Pancreas is normal.  Adrenal glands are normal. Kidneys are normal. Bladder is distended.  Mild calcification of the abdominal aorta. No ascites or significant adenopathy.  Two hundred two severe bladder distention, it is difficult to evaluate the large bowel. Large bowel appears draped over the bladder with mass effect on the bowel as a result. There is mild wall thickening of the rectum, unchanged. The sigmoid colon appears narrowed tightly for approximately 7 cm. Proximal to this, descending colon is somewhat distended to a diameter of 5.6 cm, but more proximal large bowel does not appear to be distended. This appearance is also unchanged. There is an appearance suggesting that there has been bowel anastomosis in the region of the distal sigmoid colon seen best on coronal series image number 56. This area appears unchanged as well, previously described as an irregular outpouching or diverticula.  No acute musculoskeletal findings. Moderate levoscoliosis of the lumbar spine. Visualized portions of the lung bases are clear. Evidence of ventral hernia repair over the abdomen.  IMPRESSION: Unchanged appearance suggesting prior subtotal colectomy with operative change in the left lower quadrant. Narrowing of the sigmoid colon with mild proximal bowel distension stable. Appearance of the sigmoid colon with single diverticula versus focus of anastomosis, stable. Anorectal wall thickening unchanged.   Electronically Signed   By: Skipper Cliche M.D.   On: 10/21/2013 18:58   Dg Abd Acute W/chest  10/21/2013   CLINICAL DATA:  Right-sided abdominal pain. Cough. History of Crohn's disease.  Previous subtotal colectomy.  EXAM: ACUTE ABDOMEN SERIES (ABDOMEN 2 VIEW & CHEST 1 VIEW)  COMPARISON:  07/29/2013  FINDINGS: Heart and lungs appear normal. No free air in the abdomen. There is air in nondistended bowel loops. There is slight mucosal prominence of the small bowel loops left mid abdomen. There is air in the colonic remnant in the left lower quadrant, unchanged since prior exams.  There is a moderate lumbar scoliosis and pelvic tilt.  IMPRESSION: Slight edema in the mucosa of small bowel loops in the left mid abdomen consistent with the history of Crohn's disease.   Electronically Signed   By: Rozetta Nunnery M.D.   On: 10/21/2013 15:44    Scheduled Meds: . ciprofloxacin  400 mg Intravenous Q12H  . enoxaparin (LOVENOX) injection  40 mg Subcutaneous Q24H  . folic acid  1 mg Intravenous Daily  . methylPREDNISolone (SOLU-MEDROL) injection  40 mg Intravenous Q12H  . metronidazole  500 mg Intravenous Q8H  . thiamine IV  100 mg Intravenous Daily   Continuous Infusions: . sodium chloride 125 mL/hr at  10/22/13 0528    Active Problems:   IBD (inflammatory bowel disease)   Abdominal pain, other specified site   Iron deficiency anemia, unspecified   Inflammatory bowel diseases (IBD)    Time spent: 40 minutes   [facility] Pager [phone]. If 8PM-8AM, please contact night-coverage at [email or web address], password [facility] 10/22/2013, 10:58 AM  LOS: 1 day

## 2013-10-22 NOTE — Progress Notes (Signed)
Utilization review completed. Arrion Broaddus, RN, BSN. 

## 2013-10-22 NOTE — Progress Notes (Signed)
Patient alert and oriented, pain medication alternated between po and iv, ambulated in hallway q 5mins, iv antibx, iv steriods, diet advanced regular, tolerated well, no c/o of n/v increased abd pain, ra

## 2013-10-22 NOTE — Progress Notes (Signed)
Subjective: No acute events.  He is feeling better.  Objective: Vital signs in last 24 hours: Temp:  [97.7 F (36.5 C)-98.6 F (37 C)] 98.6 F (37 C) (08/07 0654) Pulse Rate:  [82-120] 95 (08/07 0654) Resp:  [10-21] 19 (08/07 0654) BP: (124-165)/(60-99) 131/91 mmHg (08/07 0654) SpO2:  [93 %-100 %] 99 % (08/07 0654) Weight:  [125 lb (56.7 kg)] 125 lb (56.7 kg) (08/06 1925) Last BM Date: 10/21/13  Intake/Output from previous day: 08/06 0701 - 08/07 0700 In: 1199 [I.V.:1199] Out: 2075 [Urine:2075] Intake/Output this shift:    General appearance: alert and no distress GI: tender in the mid abdomen and the right abdomen, multiple surgical scars  Lab Results:  Recent Labs  10/21/13 1352 10/22/13 0640  WBC 9.1 9.1  HGB 8.9* 8.6*  HCT 30.7* 30.6*  PLT 554* 664*   BMET  Recent Labs  10/21/13 1352 10/22/13 0640  NA 139 136*  K 4.2 4.1  CL 98 93*  CO2 24 19  GLUCOSE 79 134*  BUN 5* 7  CREATININE 0.66 0.64  CALCIUM 9.2 9.5   LFT  Recent Labs  10/22/13 0640  PROT 8.2  ALBUMIN 4.0  AST 81*  ALT 92*  ALKPHOS 119*  BILITOT 0.5   PT/INR No results found for this basename: LABPROT, INR,  in the last 72 hours Hepatitis Panel No results found for this basename: HEPBSAG, HCVAB, HEPAIGM, HEPBIGM,  in the last 72 hours C-Diff No results found for this basename: CDIFFTOX,  in the last 72 hours Fecal Lactopherrin No results found for this basename: FECLLACTOFRN,  in the last 72 hours  Studies/Results: Ct Abdomen Pelvis W Contrast  10/21/2013   CLINICAL DATA:  Right-sided abdominal pain with nausea vomiting and diarrhea, with history of Crohn's disease and partial colon resection  EXAM: CT ABDOMEN AND PELVIS WITH CONTRAST  TECHNIQUE: Multidetector CT imaging of the abdomen and pelvis was performed using the standard protocol following bolus administration of intravenous contrast.  CONTRAST:  94mL OMNIPAQUE IOHEXOL 300 MG/ML  SOLN  COMPARISON:  10/21/2013 plain films,  07/29/2013 CT scan  FINDINGS: Severe hepatic steatosis. Gallbladder is normal. Spleen is normal. Pancreas is normal.  Adrenal glands are normal. Kidneys are normal. Bladder is distended.  Mild calcification of the abdominal aorta. No ascites or significant adenopathy.  Two hundred two severe bladder distention, it is difficult to evaluate the large bowel. Large bowel appears draped over the bladder with mass effect on the bowel as a result. There is mild wall thickening of the rectum, unchanged. The sigmoid colon appears narrowed tightly for approximately 7 cm. Proximal to this, descending colon is somewhat distended to a diameter of 5.6 cm, but more proximal large bowel does not appear to be distended. This appearance is also unchanged. There is an appearance suggesting that there has been bowel anastomosis in the region of the distal sigmoid colon seen best on coronal series image number 56. This area appears unchanged as well, previously described as an irregular outpouching or diverticula.  No acute musculoskeletal findings. Moderate levoscoliosis of the lumbar spine. Visualized portions of the lung bases are clear. Evidence of ventral hernia repair over the abdomen.  IMPRESSION: Unchanged appearance suggesting prior subtotal colectomy with operative change in the left lower quadrant. Narrowing of the sigmoid colon with mild proximal bowel distension stable. Appearance of the sigmoid colon with single diverticula versus focus of anastomosis, stable. Anorectal wall thickening unchanged.   Electronically Signed   By: Elodia Florence.Shelton.  On: 10/21/2013 18:58   Dg Abd Acute W/chest  10/21/2013   CLINICAL DATA:  Right-sided abdominal pain. Cough. History of Crohn's disease. Previous subtotal colectomy.  EXAM: ACUTE ABDOMEN SERIES (ABDOMEN 2 VIEW & CHEST 1 VIEW)  COMPARISON:  07/29/2013  FINDINGS: Heart and lungs appear normal. No free air in the abdomen. There is air in nondistended bowel loops. There is slight  mucosal prominence of the small bowel loops left mid abdomen. There is air in the colonic remnant in the left lower quadrant, unchanged since prior exams.  There is a moderate lumbar scoliosis and pelvic tilt.  IMPRESSION: Slight edema in the mucosa of small bowel loops in the left mid abdomen consistent with the history of Crohn's disease.   Electronically Signed   By: Rozetta Nunnery M.Shelton.   On: 10/21/2013 15:44    Medications:  Scheduled: . ciprofloxacin  400 mg Intravenous Q12H  . enoxaparin (LOVENOX) injection  40 mg Subcutaneous Q24H  . folic acid  1 mg Intravenous Daily  . methylPREDNISolone (SOLU-MEDROL) injection  40 mg Intravenous Q12H  . metronidazole  500 mg Intravenous Q8H  . thiamine IV  100 mg Intravenous Daily   Continuous: . sodium chloride 125 mL/hr at 10/22/13 0528    Assessment/Plan: 1) Crohn's disease. 2) Chronic abdominal pain.   The CT scan does not reveal any significant changes in his bowels.  He has persistent chronic changes.  The patient's CRP is normal.  A portion of the remnant colon is narrowed, but there is no evidence of obstruction or mass.  Again, this has not changed.  Clinically he is feeling better.  Plan: 1) Continue with current regimen. 2) Advance diet as tolerated.   LOS: 1 day   Benjamin Shelton 10/22/2013, 10:00 AM

## 2013-10-23 LAB — CLOSTRIDIUM DIFFICILE BY PCR: Toxigenic C. Difficile by PCR: NEGATIVE

## 2013-10-23 MED ORDER — AZATHIOPRINE 50 MG PO TABS: 50.0000 mg | ORAL_TABLET | Freq: Every day | ORAL | Status: DC

## 2013-10-23 MED ORDER — FOLIC ACID 1 MG PO TABS: 1.0000 mg | ORAL_TABLET | Freq: Every day | ORAL | Status: DC

## 2013-10-23 MED ORDER — CIPROFLOXACIN HCL 500 MG PO TABS: 500.0000 mg | ORAL_TABLET | Freq: Two times a day (BID) | ORAL | Status: DC

## 2013-10-23 MED ORDER — THIAMINE HCL 100 MG PO TABS: 100.0000 mg | ORAL_TABLET | Freq: Every day | ORAL | Status: DC

## 2013-10-23 MED ORDER — VITAMIN B-1 100 MG PO TABS
100.0000 mg | ORAL_TABLET | Freq: Every day | ORAL | Status: DC
  Administered 2013-10-23: 100 mg via ORAL
  Filled 2013-10-23: qty 1

## 2013-10-23 MED ORDER — METRONIDAZOLE 500 MG PO TABS: 500.0000 mg | ORAL_TABLET | Freq: Three times a day (TID) | ORAL | Status: DC

## 2013-10-23 MED ORDER — FOLIC ACID 1 MG PO TABS
1.0000 mg | ORAL_TABLET | Freq: Every day | ORAL | Status: DC
  Administered 2013-10-23: 1 mg via ORAL
  Filled 2013-10-23: qty 1

## 2013-10-23 MED ORDER — PREDNISONE 5 MG PO TABS: ORAL_TABLET | ORAL | Status: DC

## 2013-10-23 MED ORDER — OXYCODONE HCL 5 MG PO CAPS: 5.0000 mg | ORAL_CAPSULE | ORAL | Status: DC | PRN

## 2013-10-23 NOTE — Progress Notes (Signed)
Pt ready for discharge, awaiting Rxs from MD.  Bus pass obtained from [place].  Copy of discharge instructions given and explained to pt.  FU to be done:  Dr. [facility] for 1 week and CM will call pt on Monday to FU about getting a PCP.

## 2013-10-23 NOTE — Care Management Note (Signed)
CARE MANAGEMENT NOTE 10/23/2013  Patient:  Benjamin Shelton, Benjamin Shelton   Account Number:  [id number]  Date Initiated:  10/23/2013  Documentation initiated by:  Ricki Miller  Subjective/Objective Assessment:   33 yr old male admitted with Inflammatory bowel disease.     Action/Plan:   Case manager received order to get patient a PCP. [facility] is closed today, CM will followup on Monday and contact patient with appointment date and time.   Anticipated DC Date:  10/23/2013   Anticipated DC Plan:  [facility]  CM consult  [facility]      [facility] Choice  NA   Choice offered to / List presented to:     DME arranged  NA        HH arranged  NA      Status of service:  In process, will continue to follow  Discharge Disposition:  HOME/SELF CARE  Per UR Regulation:  Reviewed for med. necessity/level of care/duration of stay

## 2013-10-23 NOTE — Discharge Summary (Signed)
Physician Discharge Summary  Benjamin Shelton MRN: [id number] DOB/AGE: March 10, 1981 33 y.o.  PCP: No PCP Per Patient   Admit date: 10/21/2013 Discharge date: 10/23/2013  Discharge Diagnoses:      IBD (inflammatory bowel disease)   Abdominal pain, other specified site   Iron deficiency anemia, unspecified   Inflammatory bowel diseases (IBD)  Follow up recommendations Patient to follow up with PCP in 1-2 weeks CBC, CMP in 1 week    Medication List         azaTHIOprine 50 MG tablet  Commonly known as:  IMURAN  Take 1 tablet (50 mg total) by mouth daily.     ciprofloxacin 500 MG tablet  Commonly known as:  CIPRO  Take 1 tablet (500 mg total) by mouth 2 (two) times daily.     folic acid 1 MG tablet  Commonly known as:  FOLVITE  Take 1 tablet (1 mg total) by mouth daily.     metroNIDAZOLE 500 MG tablet  Commonly known as:  FLAGYL  Take 1 tablet (500 mg total) by mouth 3 (three) times daily.     oxycodone 5 MG capsule  Commonly known as:  OXY-IR  Take 1 capsule (5 mg total) by mouth every 4 (four) hours as needed.     predniSONE 5 MG tablet  Commonly known as:  DELTASONE  - 30 mg for 3 days  - 25 mg for 3 days   - 20 mg for 3 days   - 15 mg for 3 days   - 10 mg for 3 days   - 5 mg for 3 days then stop  - 20 mg for 3 days   - 20 mg for 3 days     thiamine 100 MG tablet  Take 1 tablet (100 mg total) by mouth daily.        Discharge Condition: Stable  Disposition: 01-Home or Self Care   Consults: Gastroenterology  Significant Diagnostic Studies: Ct Abdomen Pelvis W Contrast  10/21/2013   CLINICAL DATA:  Right-sided abdominal pain with nausea vomiting and diarrhea, with history of Crohn's disease and partial colon resection  EXAM: CT ABDOMEN AND PELVIS WITH CONTRAST  TECHNIQUE: Multidetector CT imaging of the abdomen and pelvis was performed using the standard protocol following bolus administration of intravenous contrast.  CONTRAST:  93mL  OMNIPAQUE IOHEXOL 300 MG/ML  SOLN  COMPARISON:  10/21/2013 plain films, 07/29/2013 CT scan  FINDINGS: Severe hepatic steatosis. Gallbladder is normal. Spleen is normal. Pancreas is normal.  Adrenal glands are normal. Kidneys are normal. Bladder is distended.  Mild calcification of the abdominal aorta. No ascites or significant adenopathy.  Two hundred two severe bladder distention, it is difficult to evaluate the large bowel. Large bowel appears draped over the bladder with mass effect on the bowel as a result. There is mild wall thickening of the rectum, unchanged. The sigmoid colon appears narrowed tightly for approximately 7 cm. Proximal to this, descending colon is somewhat distended to a diameter of 5.6 cm, but more proximal large bowel does not appear to be distended. This appearance is also unchanged. There is an appearance suggesting that there has been bowel anastomosis in the region of the distal sigmoid colon seen best on coronal series image number 56. This area appears unchanged as well, previously described as an irregular outpouching or diverticula.  No acute musculoskeletal findings. Moderate levoscoliosis of the lumbar spine. Visualized portions of the lung bases are clear. Evidence of ventral hernia repair over the  abdomen.  IMPRESSION: Unchanged appearance suggesting prior subtotal colectomy with operative change in the left lower quadrant. Narrowing of the sigmoid colon with mild proximal bowel distension stable. Appearance of the sigmoid colon with single diverticula versus focus of anastomosis, stable. Anorectal wall thickening unchanged.   Electronically Signed   By: Skipper Cliche M.D.   On: 10/21/2013 18:58   Dg Abd Acute W/chest  10/21/2013   CLINICAL DATA:  Right-sided abdominal pain. Cough. History of Crohn's disease. Previous subtotal colectomy.  EXAM: ACUTE ABDOMEN SERIES (ABDOMEN 2 VIEW & CHEST 1 VIEW)  COMPARISON:  07/29/2013  FINDINGS: Heart and lungs appear normal. No free air in  the abdomen. There is air in nondistended bowel loops. There is slight mucosal prominence of the small bowel loops left mid abdomen. There is air in the colonic remnant in the left lower quadrant, unchanged since prior exams.  There is a moderate lumbar scoliosis and pelvic tilt.  IMPRESSION: Slight edema in the mucosa of small bowel loops in the left mid abdomen consistent with the history of Crohn's disease.   Electronically Signed   By: Rozetta Nunnery M.D.   On: 10/21/2013 15:44       Microbiology: Recent Results (from the past 240 hour(s))  CLOSTRIDIUM DIFFICILE BY PCR     Status: None   Collection Time    10/22/13 10:29 PM      Result Value Ref Range Status   C difficile by pcr NEGATIVE  NEGATIVE Final     Labs: Results for orders placed during the hospital encounter of 10/21/13 (from the past 48 hour(s))  CBC WITH DIFFERENTIAL     Status: Abnormal   Collection Time    10/21/13  1:52 PM      Result Value Ref Range   WBC 9.1  4.0 - 10.5 K/uL   RBC 4.30  4.22 - 5.81 MIL/uL   Hemoglobin 8.9 (*) 13.0 - 17.0 g/dL   HCT 30.7 (*) 39.0 - 52.0 %   MCV 71.4 (*) 78.0 - 100.0 fL   MCH 20.7 (*) 26.0 - 34.0 pg   MCHC 29.0 (*) 30.0 - 36.0 g/dL   RDW 19.1 (*) 11.5 - 15.5 %   Platelets 554 (*) 150 - 400 K/uL   Neutrophils Relative % 67  43 - 77 %   Lymphocytes Relative 18  12 - 46 %   Monocytes Relative 13 (*) 3 - 12 %   Eosinophils Relative 1  0 - 5 %   Basophils Relative 1  0 - 1 %   Neutro Abs 6.1  1.7 - 7.7 K/uL   Lymphs Abs 1.6  0.7 - 4.0 K/uL   Monocytes Absolute 1.2 (*) 0.1 - 1.0 K/uL   Eosinophils Absolute 0.1  0.0 - 0.7 K/uL   Basophils Absolute 0.1  0.0 - 0.1 K/uL   RBC Morphology TARGET CELLS     WBC Morphology ATYPICAL LYMPHOCYTES     Smear Review LARGE PLATELETS PRESENT    COMPREHENSIVE METABOLIC PANEL     Status: Abnormal   Collection Time    10/21/13  1:52 PM      Result Value Ref Range   Sodium 139  137 - 147 mEq/L   Potassium 4.2  3.7 - 5.3 mEq/L   Chloride 98  96  - 112 mEq/L   CO2 24  19 - 32 mEq/L   Glucose, Bld 79  70 - 99 mg/dL   BUN 5 (*) 6 - 23 mg/dL  Creatinine, Ser 0.66  0.50 - 1.35 mg/dL   Calcium 9.2  8.4 - 10.5 mg/dL   Total Protein 8.4 (*) 6.0 - 8.3 g/dL   Albumin 4.1  3.5 - 5.2 g/dL   AST 130 (*) 0 - 37 U/L   ALT 107 (*) 0 - 53 U/L   Alkaline Phosphatase 112  39 - 117 U/L   Total Bilirubin 0.2 (*) 0.3 - 1.2 mg/dL   GFR calc non Af Amer >90  >90 mL/min   GFR calc Af Amer >90  >90 mL/min   Comment: (NOTE)     The eGFR has been calculated using the CKD EPI equation.     This calculation has not been validated in all clinical situations.     eGFR's persistently <90 mL/min signify possible Chronic Kidney     Disease.   Anion gap 17 (*) 5 - 15  LIPASE, BLOOD     Status: None   Collection Time    10/21/13  1:52 PM      Result Value Ref Range   Lipase 44  11 - 59 U/L  URINALYSIS, ROUTINE W REFLEX MICROSCOPIC     Status: Abnormal   Collection Time    10/21/13  3:53 PM      Result Value Ref Range   Color, Urine YELLOW  YELLOW   APPearance CLEAR  CLEAR   Specific Gravity, Urine 1.004 (*) 1.005 - 1.030   pH 6.5  5.0 - 8.0   Glucose, UA NEGATIVE  NEGATIVE mg/dL   Hgb urine dipstick NEGATIVE  NEGATIVE   Bilirubin Urine NEGATIVE  NEGATIVE   Ketones, ur NEGATIVE  NEGATIVE mg/dL   Protein, ur NEGATIVE  NEGATIVE mg/dL   Urobilinogen, UA 0.2  0.0 - 1.0 mg/dL   Nitrite NEGATIVE  NEGATIVE   Leukocytes, UA NEGATIVE  NEGATIVE   Comment: MICROSCOPIC NOT DONE ON URINES WITH NEGATIVE PROTEIN, BLOOD, LEUKOCYTES, NITRITE, OR GLUCOSE <1000 mg/dL.  HIV ANTIBODY (ROUTINE TESTING)     Status: None   Collection Time    10/21/13  5:47 PM      Result Value Ref Range   HIV 1&2 Ab, 4th Generation NONREACTIVE  NONREACTIVE   Comment: (NOTE)     A NONREACTIVE HIV Ag/Ab result does not exclude HIV infection since     the time frame for seroconversion is variable. If acute HIV infection     is suspected, a HIV-1 RNA Qualitative TMA test is recommended.      HIV-1/2 Antibody Diff         Not indicated.     HIV-1 RNA, Qual TMA           Not indicated.     PLEASE NOTE: This information has been disclosed to you from records     whose confidentiality may be protected by state law. If your state     requires such protection, then the state law prohibits you from making     any further disclosure of the information without the specific written     consent of the person to whom it pertains, or as otherwise permitted     by law. A general authorization for the release of medical or other     information is NOT sufficient for this purpose.     The performance of this assay has not been clinically validated in     patients less than 53 years old.     Performed at [place]  Status: Abnormal   Collection Time    10/21/13  6:27 PM      Result Value Ref Range   CRP <0.5 (*) <0.60 mg/dL   Comment: Performed at [facility]  CBC     Status: Abnormal   Collection Time    10/22/13  6:40 AM      Result Value Ref Range   WBC 9.1  4.0 - 10.5 K/uL   RBC 4.28  4.22 - 5.81 MIL/uL   Hemoglobin 8.6 (*) 13.0 - 17.0 g/dL   HCT 30.6 (*) 39.0 - 52.0 %   MCV 71.5 (*) 78.0 - 100.0 fL   MCH 20.1 (*) 26.0 - 34.0 pg   MCHC 28.1 (*) 30.0 - 36.0 g/dL   RDW 19.0 (*) 11.5 - 15.5 %   Platelets 664 (*) 150 - 400 K/uL  COMPREHENSIVE METABOLIC PANEL     Status: Abnormal   Collection Time    10/22/13  6:40 AM      Result Value Ref Range   Sodium 136 (*) 137 - 147 mEq/L   Potassium 4.1  3.7 - 5.3 mEq/L   Chloride 93 (*) 96 - 112 mEq/L   CO2 19  19 - 32 mEq/L   Glucose, Bld 134 (*) 70 - 99 mg/dL   BUN 7  6 - 23 mg/dL   Creatinine, Ser 0.64  0.50 - 1.35 mg/dL   Calcium 9.5  8.4 - 10.5 mg/dL   Total Protein 8.2  6.0 - 8.3 g/dL   Albumin 4.0  3.5 - 5.2 g/dL   AST 81 (*) 0 - 37 U/L   ALT 92 (*) 0 - 53 U/L   Alkaline Phosphatase 119 (*) 39 - 117 U/L   Total Bilirubin 0.5  0.3 - 1.2 mg/dL   GFR calc non Af Amer >90  >90  mL/min   GFR calc Af Amer >90  >90 mL/min   Comment: (NOTE)     The eGFR has been calculated using the CKD EPI equation.     This calculation has not been validated in all clinical situations.     eGFR's persistently <90 mL/min signify possible Chronic Kidney     Disease.   Anion gap 24 (*) 5 - 15  CLOSTRIDIUM DIFFICILE BY PCR     Status: None   Collection Time    10/22/13 10:29 PM      Result Value Ref Range   C difficile by pcr NEGATIVE  NEGATIVE     HPI : 33 year old male with a PMH of Crohn's disease (dx 1991) s/p subtotal colectomy in 1995 at [facility] admitted to the hospital with complaints of abdominal pain. He states that his pain abruptly increased yesterday and he was drinking beers with his brother. With the pain he started to have an increase in his diarrhea bowel movements. No hematochezia. In the past he was treated with Imuran 50 mg QD, prednisone, and Pentasa. The patient is poorly compliant and he has not obtained any of his needed medications. He states that his last colonoscopy was in 2009. He was in the hospital last year and evaluated by Dr. Fuller Plan. The patient never followed up after his inpatient hospitalization. The last colonoscopy was in 2009 and he was subsequently incarcerated between 2009 and 2014. The patient is being admitted as his pain has not improved with dilaudid  HOSPITAL COURSE:   Crohn's exacerbation   started on ciprofloxacin and Flagyl, now being transitioned to oral antibiotics for  another 10 days Started on IV steroids at 40 mg IV every 12 Transitioning to an oral steroid taper Patient requested me to refill  His Imuran CRP normal  No evidence of obstruction  Tolerating solids, wants to go home Seen by Dr. Saralyn Pilar hung during this hospitalization will recommend followup with him in the office Case management consultation requested to provide referral to her PCP  2-Transaminases; in setting recent alcohol intake. Last admission [place] only showed  hepatic steatosis. Hepatitis viral panel last admission was negative. I will check HIV. Liver function improving, will need CMP in 1 week  3-Iron deficiency Anemia; PCP to start Oral iron when able to tolerate diet. 2 Months ago iron was at 11 ferritin at 19. Hb baseline 9 to 10. Repeat CBC in one week  4-Alcohol intake; denies regular intake. Continue oral thiamine and folate repletion.      Discharge Exam: Blood pressure 145/92, pulse 96, temperature 98.4 F (36.9 C), temperature source Oral, resp. rate 21, height 5' 3.5" (1.613 m), weight 56.7 kg (125 lb), SpO2 98.00%.   General: Appears calm and comfortable, thin appearing.  Eyes: PERRL, normal lids, irises & conjunctiva  ENT: grossly normal hearing, lips & tongue dry.  Neck: no LAD, masses or thyromegaly  Cardiovascular: RRR, no m/r/g. No LE edema.  Telemetry: SR, no arrhythmias  Respiratory: CTA bilaterally, no w/r/r. Normal respiratory effort.  Abdomen: soft, right lower quadrant tenderness, no rigidity mild guarding.  Skin: no rash or induration seen on limited exam  Musculoskeletal: grossly normal tone BUE/BLE  Psychiatric: grossly normal mood and affect, speech fluent and appropriate  Neurologic: grossly non-focal.        Discharge Instructions   Diet - low sodium heart healthy    Complete by:  As directed      Increase activity slowly    Complete by:  As directed              Signed: Lakeria Starkman 10/23/2013, 10:58 AM

## 2013-10-23 NOTE — Progress Notes (Signed)
Clinical Education officer, museum (CSW) received call from RN stating that patient needs a bus pass. Per RN patient is medically stable to ride the bus. CSW gave patient bus pass. Per patient he knows where the bus stop is at. Please reconsult if future social work needs arise. CSW signing off.   Blima Rich, [place] Weekend CSW [phone]

## 2013-10-25 LAB — GI PATHOGEN PANEL BY PCR, STOOL
C difficile toxin A/B: NEGATIVE
Campylobacter by PCR: NEGATIVE
Cryptosporidium by PCR: NEGATIVE
E coli (ETEC) LT/ST: NEGATIVE
E coli (STEC): NEGATIVE
E coli 0157 by PCR: NEGATIVE
G lamblia by PCR: NEGATIVE
Norovirus GI/GII: NEGATIVE
Rotavirus A by PCR: NEGATIVE
Salmonella by PCR: NEGATIVE
Shigella by PCR: NEGATIVE

## 2013-11-02 ENCOUNTER — Inpatient Hospital Stay: Payer: Self-pay | Admitting: Internal Medicine

## 2013-11-25 ENCOUNTER — Inpatient Hospital Stay (HOSPITAL_COMMUNITY)
Admission: EM | Admit: 2013-11-25 | Discharge: 2013-11-29 | DRG: 386 | Disposition: A | Discharge status: Home / Self Care | Payer: Self-pay | Attending: Internal Medicine | Admitting: Internal Medicine

## 2013-11-25 ENCOUNTER — Encounter (HOSPITAL_COMMUNITY): Payer: Self-pay | Admitting: Emergency Medicine

## 2013-11-25 ENCOUNTER — Emergency Department (HOSPITAL_COMMUNITY): Payer: Self-pay

## 2013-11-25 ENCOUNTER — Inpatient Hospital Stay (HOSPITAL_COMMUNITY): Payer: Self-pay

## 2013-11-25 DIAGNOSIS — R933 Abnormal findings on diagnostic imaging of other parts of digestive tract: Secondary | ICD-10-CM

## 2013-11-25 DIAGNOSIS — F1010 Alcohol abuse, uncomplicated: Secondary | ICD-10-CM | POA: Diagnosis present

## 2013-11-25 DIAGNOSIS — Z23 Encounter for immunization: Secondary | ICD-10-CM

## 2013-11-25 DIAGNOSIS — K50911 Crohn's disease, unspecified, with rectal bleeding: Secondary | ICD-10-CM

## 2013-11-25 DIAGNOSIS — E43 Unspecified severe protein-calorie malnutrition: Secondary | ICD-10-CM | POA: Diagnosis present

## 2013-11-25 DIAGNOSIS — K626 Ulcer of anus and rectum: Secondary | ICD-10-CM | POA: Diagnosis present

## 2013-11-25 DIAGNOSIS — K50919 Crohn's disease, unspecified, with unspecified complications: Secondary | ICD-10-CM

## 2013-11-25 DIAGNOSIS — K5090 Crohn's disease, unspecified, without complications: Principal | ICD-10-CM

## 2013-11-25 DIAGNOSIS — K50918 Crohn's disease, unspecified, with other complication: Secondary | ICD-10-CM

## 2013-11-25 DIAGNOSIS — Z720 Tobacco use: Secondary | ICD-10-CM | POA: Diagnosis present

## 2013-11-25 DIAGNOSIS — K7689 Other specified diseases of liver: Secondary | ICD-10-CM | POA: Diagnosis present

## 2013-11-25 DIAGNOSIS — E639 Nutritional deficiency, unspecified: Secondary | ICD-10-CM | POA: Diagnosis present

## 2013-11-25 DIAGNOSIS — D509 Iron deficiency anemia, unspecified: Secondary | ICD-10-CM

## 2013-11-25 DIAGNOSIS — K922 Gastrointestinal hemorrhage, unspecified: Secondary | ICD-10-CM

## 2013-11-25 DIAGNOSIS — Z79899 Other long term (current) drug therapy: Secondary | ICD-10-CM

## 2013-11-25 DIAGNOSIS — F17200 Nicotine dependence, unspecified, uncomplicated: Secondary | ICD-10-CM | POA: Diagnosis present

## 2013-11-25 DIAGNOSIS — Z66 Do not resuscitate: Secondary | ICD-10-CM | POA: Diagnosis not present

## 2013-11-25 DIAGNOSIS — Z9049 Acquired absence of other specified parts of digestive tract: Secondary | ICD-10-CM

## 2013-11-25 DIAGNOSIS — K529 Noninfective gastroenteritis and colitis, unspecified: Secondary | ICD-10-CM

## 2013-11-25 DIAGNOSIS — D500 Iron deficiency anemia secondary to blood loss (chronic): Secondary | ICD-10-CM | POA: Diagnosis present

## 2013-11-25 DIAGNOSIS — D649 Anemia, unspecified: Secondary | ICD-10-CM

## 2013-11-25 DIAGNOSIS — D62 Acute posthemorrhagic anemia: Secondary | ICD-10-CM | POA: Diagnosis present

## 2013-11-25 DIAGNOSIS — D473 Essential (hemorrhagic) thrombocythemia: Secondary | ICD-10-CM | POA: Diagnosis present

## 2013-11-25 DIAGNOSIS — R109 Unspecified abdominal pain: Secondary | ICD-10-CM

## 2013-11-25 LAB — CBC WITH DIFFERENTIAL/PLATELET
BASOS PCT: 3 % — AB (ref 0–1)
Band Neutrophils: 0 % (ref 0–10)
Basophils Absolute: 0.2 10*3/uL — ABNORMAL HIGH (ref 0.0–0.1)
Basophils Absolute: 0.1 10*3/uL (ref 0.0–0.1)
Basophils Relative: 1 % (ref 0–1)
Blasts: 0 %
EOS ABS: 0 10*3/uL (ref 0.0–0.7)
EOS PCT: 0 % (ref 0–5)
Eosinophils Absolute: 0.2 10*3/uL (ref 0.0–0.7)
Eosinophils Relative: 2 % (ref 0–5)
HCT: 25.6 % — ABNORMAL LOW (ref 39.0–52.0)
HCT: 30.9 % — ABNORMAL LOW (ref 39.0–52.0)
HEMOGLOBIN: 7 g/dL — AB (ref 13.0–17.0)
Hemoglobin: 8.9 g/dL — ABNORMAL LOW (ref 13.0–17.0)
Lymphocytes Relative: 22 % (ref 12–46)
Lymphocytes Relative: 4 % — ABNORMAL LOW (ref 12–46)
Lymphs Abs: 1.8 10*3/uL (ref 0.7–4.0)
Lymphs Abs: 0.5 10*3/uL — ABNORMAL LOW (ref 0.7–4.0)
MCH: 18.6 pg — AB (ref 26.0–34.0)
MCH: 21 pg — ABNORMAL LOW (ref 26.0–34.0)
MCHC: 27.3 g/dL — ABNORMAL LOW (ref 30.0–36.0)
MCHC: 28.8 g/dL — ABNORMAL LOW (ref 30.0–36.0)
MCV: 67.9 fL — ABNORMAL LOW (ref 78.0–100.0)
MCV: 73 fL — AB (ref 78.0–100.0)
METAMYELOCYTES PCT: 0 %
MONO ABS: 0.1 10*3/uL (ref 0.1–1.0)
MONOS PCT: 16 % — AB (ref 3–12)
Monocytes Absolute: 1.3 10*3/uL — ABNORMAL HIGH (ref 0.1–1.0)
Monocytes Relative: 1 % — ABNORMAL LOW (ref 3–12)
Myelocytes: 0 %
NEUTROS ABS: 4.6 10*3/uL (ref 1.7–7.7)
Neutro Abs: 10.9 10*3/uL — ABNORMAL HIGH (ref 1.7–7.7)
Neutrophils Relative %: 58 % (ref 43–77)
Neutrophils Relative %: 94 % — ABNORMAL HIGH (ref 43–77)
Platelets: 731 10*3/uL — ABNORMAL HIGH (ref 150–400)
Platelets: 730 10*3/uL — ABNORMAL HIGH (ref 150–400)
Promyelocytes Absolute: 0 %
RBC: 3.77 MIL/uL — ABNORMAL LOW (ref 4.22–5.81)
RBC: 4.23 MIL/uL (ref 4.22–5.81)
RDW: 20.5 % — ABNORMAL HIGH (ref 11.5–15.5)
RDW: 21.4 % — ABNORMAL HIGH (ref 11.5–15.5)
SMEAR REVIEW: INCREASED
WBC: 8.4 10*3/uL (ref 4.0–10.5)
WBC: 11.6 10*3/uL — ABNORMAL HIGH (ref 4.0–10.5)
nRBC: 0 /100 WBC

## 2013-11-25 LAB — COMPREHENSIVE METABOLIC PANEL
ALBUMIN: 3.6 g/dL (ref 3.5–5.2)
ALBUMIN: 3.7 g/dL (ref 3.5–5.2)
ALT: 70 U/L — ABNORMAL HIGH (ref 0–53)
ALT: 67 U/L — ABNORMAL HIGH (ref 0–53)
AST: 105 U/L — ABNORMAL HIGH (ref 0–37)
AST: 71 U/L — AB (ref 0–37)
Alkaline Phosphatase: 77 U/L (ref 39–117)
Alkaline Phosphatase: 88 U/L (ref 39–117)
Anion gap: 17 — ABNORMAL HIGH (ref 5–15)
Anion gap: 24 — ABNORMAL HIGH (ref 5–15)
BILIRUBIN TOTAL: 0.2 mg/dL — AB (ref 0.3–1.2)
BUN: 7 mg/dL (ref 6–23)
BUN: 7 mg/dL (ref 6–23)
CHLORIDE: 97 meq/L (ref 96–112)
CO2: 23 mEq/L (ref 19–32)
CO2: 18 mEq/L — ABNORMAL LOW (ref 19–32)
Calcium: 8.7 mg/dL (ref 8.4–10.5)
Calcium: 9.1 mg/dL (ref 8.4–10.5)
Chloride: 94 mEq/L — ABNORMAL LOW (ref 96–112)
Creatinine, Ser: 0.65 mg/dL (ref 0.50–1.35)
Creatinine, Ser: 0.58 mg/dL (ref 0.50–1.35)
GFR calc Af Amer: >90 mL/min (ref >90)
GFR calc Af Amer: >90 mL/min (ref >90)
GFR calc non Af Amer: >90 mL/min (ref >90)
GFR calc non Af Amer: >90 mL/min (ref >90)
Glucose, Bld: 86 mg/dL (ref 70–99)
Glucose, Bld: 205 mg/dL — ABNORMAL HIGH (ref 70–99)
POTASSIUM: 4.2 meq/L (ref 3.7–5.3)
Potassium: 4.5 mEq/L (ref 3.7–5.3)
SODIUM: 137 meq/L (ref 137–147)
Sodium: 136 mEq/L — ABNORMAL LOW (ref 137–147)
Total Bilirubin: 0.5 mg/dL (ref 0.3–1.2)
Total Protein: 7.3 g/dL (ref 6.0–8.3)
Total Protein: 8 g/dL (ref 6.0–8.3)

## 2013-11-25 LAB — CLOSTRIDIUM DIFFICILE BY PCR: Toxigenic C. Difficile by PCR: NEGATIVE

## 2013-11-25 LAB — LACTIC ACID, PLASMA: LACTIC ACID, VENOUS: 4.4 mmol/L — AB (ref 0.5–2.2)

## 2013-11-25 LAB — POC OCCULT BLOOD, ED: Fecal Occult Bld: POSITIVE — AB

## 2013-11-25 LAB — LIPASE, BLOOD: Lipase: 25 U/L (ref 11–59)

## 2013-11-25 LAB — PREPARE RBC (CROSSMATCH)

## 2013-11-25 LAB — ABO/RH: ABO/RH(D): O POS

## 2013-11-25 LAB — AMYLASE: Amylase: 251 U/L — ABNORMAL HIGH (ref 0–105)

## 2013-11-25 MED ORDER — INFLUENZA VAC SPLIT QUAD 0.5 ML IM SUSY
0.5000 mL | PREFILLED_SYRINGE | INTRAMUSCULAR | Status: AC
  Administered 2013-11-26: 0.5 mL via INTRAMUSCULAR
  Filled 2013-11-25: qty 0.5

## 2013-11-25 MED ORDER — IOHEXOL 300 MG/ML  SOLN
25.0000 mL | INTRAMUSCULAR | Status: AC
  Administered 2013-11-25 (×2): 25 mL via ORAL

## 2013-11-25 MED ORDER — VITAMIN B-1 100 MG PO TABS
100.0000 mg | ORAL_TABLET | Freq: Every day | ORAL | Status: DC
  Filled 2013-11-25: qty 1

## 2013-11-25 MED ORDER — NICOTINE 14 MG/24HR TD PT24
14.0000 mg | MEDICATED_PATCH | Freq: Every day | TRANSDERMAL | Status: DC
  Administered 2013-11-25 – 2013-11-28 (×4): 14 mg via TRANSDERMAL
  Filled 2013-11-25 (×6): qty 1

## 2013-11-25 MED ORDER — HYDROMORPHONE HCL PF 1 MG/ML IJ SOLN
1.0000 mg | Freq: Once | INTRAMUSCULAR | Status: AC
Start: 2013-11-25 — End: 2013-11-25
  Administered 2013-11-25: 1 mg via INTRAVENOUS
  Filled 2013-11-25: qty 1

## 2013-11-25 MED ORDER — ONDANSETRON HCL 4 MG/2ML IJ SOLN
4.0000 mg | Freq: Once | INTRAMUSCULAR | Status: AC
  Administered 2013-11-25: 4 mg via INTRAVENOUS
  Filled 2013-11-25: qty 2

## 2013-11-25 MED ORDER — HYDROMORPHONE HCL PF 1 MG/ML IJ SOLN
1.0000 mg | Freq: Once | INTRAMUSCULAR | Status: AC
  Administered 2013-11-25: 1 mg via INTRAVENOUS
  Filled 2013-11-25: qty 1

## 2013-11-25 MED ORDER — ACETAMINOPHEN 650 MG RE SUPP: 650.0000 mg | Freq: Four times a day (QID) | RECTAL | Status: DC | PRN

## 2013-11-25 MED ORDER — LORAZEPAM 1 MG PO TABS: 1.0000 mg | ORAL_TABLET | Freq: Four times a day (QID) | ORAL | Status: AC | PRN

## 2013-11-25 MED ORDER — LORAZEPAM 2 MG/ML IJ SOLN
0.0000 mg | Freq: Four times a day (QID) | INTRAMUSCULAR | Status: AC
  Administered 2013-11-25: 1 mg via INTRAVENOUS

## 2013-11-25 MED ORDER — ONDANSETRON HCL 4 MG/2ML IJ SOLN
4.0000 mg | Freq: Four times a day (QID) | INTRAMUSCULAR | Status: DC | PRN
  Administered 2013-11-25 – 2013-11-27 (×10): 4 mg via INTRAVENOUS
  Filled 2013-11-25 (×10): qty 2

## 2013-11-25 MED ORDER — ONDANSETRON HCL 4 MG PO TABS
4.0000 mg | ORAL_TABLET | Freq: Four times a day (QID) | ORAL | Status: DC | PRN
  Administered 2013-11-28 – 2013-11-29 (×6): 4 mg via ORAL
  Filled 2013-11-25 (×6): qty 1

## 2013-11-25 MED ORDER — MORPHINE SULFATE 2 MG/ML IJ SOLN
1.0000 mg | INTRAMUSCULAR | Status: DC | PRN
  Administered 2013-11-25 (×2): 1 mg via INTRAVENOUS
  Filled 2013-11-25 (×2): qty 1

## 2013-11-25 MED ORDER — HYDROMORPHONE HCL PF 1 MG/ML IJ SOLN
1.0000 mg | INTRAMUSCULAR | Status: DC | PRN
  Administered 2013-11-25 – 2013-11-26 (×4): 1 mg via INTRAVENOUS
  Filled 2013-11-25 (×4): qty 1

## 2013-11-25 MED ORDER — LORAZEPAM 2 MG/ML IJ SOLN
1.0000 mg | Freq: Four times a day (QID) | INTRAMUSCULAR | Status: AC | PRN
  Filled 2013-11-25: qty 1

## 2013-11-25 MED ORDER — METHYLPREDNISOLONE SODIUM SUCC 125 MG IJ SOLR
125.0000 mg | Freq: Once | INTRAMUSCULAR | Status: AC
  Administered 2013-11-25: 125 mg via INTRAVENOUS
  Filled 2013-11-25: qty 2

## 2013-11-25 MED ORDER — SODIUM CHLORIDE 0.9 % IV SOLN
10.0000 mL/h | Freq: Once | INTRAVENOUS | Status: AC
  Administered 2013-11-25: 10 mL/h via INTRAVENOUS

## 2013-11-25 MED ORDER — THIAMINE HCL 100 MG/ML IJ SOLN
100.0000 mg | Freq: Every day | INTRAMUSCULAR | Status: DC
  Filled 2013-11-25 (×4): qty 1

## 2013-11-25 MED ORDER — ADULT MULTIVITAMIN W/MINERALS CH
1.0000 | ORAL_TABLET | Freq: Every day | ORAL | Status: DC
  Administered 2013-11-25 – 2013-11-29 (×5): 1 via ORAL
  Filled 2013-11-25 (×5): qty 1

## 2013-11-25 MED ORDER — ACETAMINOPHEN 325 MG PO TABS: 650.0000 mg | ORAL_TABLET | Freq: Four times a day (QID) | ORAL | Status: DC | PRN

## 2013-11-25 MED ORDER — PANTOPRAZOLE SODIUM 40 MG IV SOLR
40.0000 mg | Freq: Two times a day (BID) | INTRAVENOUS | Status: DC
  Administered 2013-11-25 – 2013-11-28 (×7): 40 mg via INTRAVENOUS
  Filled 2013-11-25 (×10): qty 40

## 2013-11-25 MED ORDER — METHYLPREDNISOLONE SODIUM SUCC 125 MG IJ SOLR
60.0000 mg | Freq: Two times a day (BID) | INTRAMUSCULAR | Status: DC
  Administered 2013-11-25 – 2013-11-28 (×8): 60 mg via INTRAVENOUS
  Filled 2013-11-25 (×10): qty 0.96
  Filled 2013-11-25: qty 2

## 2013-11-25 MED ORDER — ONDANSETRON HCL 4 MG/2ML IJ SOLN
4.0000 mg | Freq: Once | INTRAMUSCULAR | Status: AC
Start: 2013-11-25 — End: 2013-11-25
  Administered 2013-11-25: 4 mg via INTRAVENOUS
  Filled 2013-11-25: qty 2

## 2013-11-25 MED ORDER — FOLIC ACID 1 MG PO TABS
1.0000 mg | ORAL_TABLET | Freq: Every day | ORAL | Status: DC
  Administered 2013-11-25 – 2013-11-29 (×5): 1 mg via ORAL
  Filled 2013-11-25 (×5): qty 1

## 2013-11-25 MED ORDER — PNEUMOCOCCAL VAC POLYVALENT 25 MCG/0.5ML IJ INJ
0.5000 mL | INJECTION | INTRAMUSCULAR | Status: AC
Start: 2013-11-26 — End: 2013-11-26
  Administered 2013-11-26: 0.5 mL via INTRAMUSCULAR
  Filled 2013-11-25: qty 0.5

## 2013-11-25 MED ORDER — AZATHIOPRINE 50 MG PO TABS
50.0000 mg | ORAL_TABLET | Freq: Every day | ORAL | Status: DC
  Administered 2013-11-25 – 2013-11-29 (×5): 50 mg via ORAL
  Filled 2013-11-25 (×5): qty 1

## 2013-11-25 MED ORDER — VITAMIN B-1 100 MG PO TABS
100.0000 mg | ORAL_TABLET | Freq: Every day | ORAL | Status: DC
  Administered 2013-11-25 – 2013-11-29 (×5): 100 mg via ORAL
  Filled 2013-11-25 (×5): qty 1

## 2013-11-25 MED ORDER — IOHEXOL 300 MG/ML  SOLN
100.0000 mL | Freq: Once | INTRAMUSCULAR | Status: AC | PRN
  Administered 2013-11-25: 100 mL via INTRAVENOUS

## 2013-11-25 MED ORDER — SODIUM CHLORIDE 0.9 % IV SOLN
INTRAVENOUS | Status: AC
  Administered 2013-11-25 – 2013-11-26 (×3): via INTRAVENOUS

## 2013-11-25 MED ORDER — LORAZEPAM 2 MG/ML IJ SOLN: 0.0000 mg | Freq: Two times a day (BID) | INTRAMUSCULAR | Status: AC

## 2013-11-25 MED ORDER — SODIUM CHLORIDE 0.9 % IV BOLUS (SEPSIS)
1000.0000 mL | Freq: Once | INTRAVENOUS | Status: AC
  Administered 2013-11-25: 1000 mL via INTRAVENOUS

## 2013-11-25 MED ORDER — FOLIC ACID 1 MG PO TABS
1.0000 mg | ORAL_TABLET | Freq: Every day | ORAL | Status: DC
  Filled 2013-11-25: qty 1

## 2013-11-25 MED ORDER — METHYLPREDNISOLONE SODIUM SUCC 40 MG IJ SOLR
40.0000 mg | Freq: Every day | INTRAMUSCULAR | Status: DC
  Filled 2013-11-25: qty 1

## 2013-11-25 NOTE — ED Notes (Signed)
Attempted to give report 

## 2013-11-25 NOTE — ED Notes (Signed)
Admitting MD at bedside.

## 2013-11-25 NOTE — Progress Notes (Signed)
Attempted to call and get report from ED. No one answered the phone

## 2013-11-25 NOTE — Consult Note (Signed)
Referring Provider: [facility] Primary Care Physician:  No PCP Per Patient Primary Gastroenterologist:  unassigned  Reason for Consultation:   Crohns flare, anemia     HPI: Benjamin Shelton is a 33 y.o. male  with a hx of Crohns since 7. He had a colectomy with colostomy which was reversed in 1995. He has had 5 hernia repairs and has had a PEG placement. He had previously been  maintained on imuran 50 mg qd, prednisone 10 mg qd,pentasa 1000 milligrams daily and Lortab once daily. His last colonoscopy was in 2009 and he says that was cold good" he was lost to GI followup in 2009 when he was incarcerated through February of 2014. He did get refills for Imuran and in February and April of 2014 12.2 g now he stopped taking he was admitted in May with loose stools and right lower quadrant abdominal pain. CT at that time showed ongoing rectal and anal wall thickening. He was treated with Cipro and Flagyl and discharged home on Cipro and Flagyl with instructions to follow with GI however he did not follow up. He was readmitted on August 6 , 2015 to [facility] and was seen by Dr. Benson Norway. He had an abdominal series that had no free air in the abdomen. There was air and nondistended bowel loops. There was slight mucosal prominence of the small bowel loops in the left mid abdomen. He was discharged on August 8 on Imuran 50 mg Cipro 500 mg twice a day folic acid and Flagyl 702 mg 3 times a day and oxycodone he was advised to followup with Dr. Benson Norway, however the patient states when he called to get an appointment he was told by the secretary that their office no longer seen new patient's. He finished a steroid taper several days ago but states that this past Monday he began to develop right lower quite and abdominal pain and diarrhea.He ran out of his imuran 3-4 days prior to admission. He had bloody diarrhea for 2 days and came to the emergency room yesterday where he was found to have heme-positive stools  and a hemoglobin of 7 he has been transfused. He C. difficile is negative. He also has been noted on past admissions to have elevated transaminases which were felt to be due to his ETOH intake. He continues to drink and says his last drink was yesterday.Pt feels his morphine and dilaudid are not adequately controlling his pain.  . Past Medical History  Diagnosis Date  . Crohn's disease dx'd 1991    Past Surgical History  Procedure Laterality Date  . Hernia repair      "stomach"  . Colostomy    . Partial colectomy    . Colostomy reversal      Prior to Admission medications   Medication Sig Start Date End Date Taking? Authorizing Provider  azaTHIOprine (IMURAN) 50 MG tablet Take 1 tablet (50 mg total) by mouth daily. 10/23/13  Yes Reyne Dumas, MD  folic acid (FOLVITE) 1 MG tablet Take 1 tablet (1 mg total) by mouth daily. 10/23/13  Yes Reyne Dumas, MD  thiamine 100 MG tablet Take 1 tablet (100 mg total) by mouth daily. 10/23/13  Yes Reyne Dumas, MD    Current Facility-Administered Medications  Medication Dose Route Frequency Provider Last Rate Last Dose  . 0.9 %  sodium chloride infusion   Intravenous Continuous Melton Alar, PA-C 75 mL/hr at 11/25/13 0757    . acetaminophen (TYLENOL) tablet 650 mg  650  mg Oral Q6H PRN Rise Patience, MD       Or  . acetaminophen (TYLENOL) suppository 650 mg  650 mg Rectal Q6H PRN Rise Patience, MD      . azaTHIOprine (IMURAN) tablet 50 mg  50 mg Oral Daily Rise Patience, MD   50 mg at 11/25/13 1012  . folic acid (FOLVITE) tablet 1 mg  1 mg Oral Daily Rise Patience, MD   1 mg at 11/25/13 1012  . HYDROmorphone (DILAUDID) injection 1 mg  1 mg Intravenous Q4H PRN Melton Alar, PA-C      . [START ON 11/26/2013] Influenza vac split quadrivalent PF (FLUARIX) injection 0.5 mL  0.5 mL Intramuscular Tomorrow-1000 Shanker Kristeen Mans, MD      . LORazepam (ATIVAN) injection 0-4 mg  0-4 mg Intravenous Q6H Rise Patience, MD   1 mg at  11/25/13 0086   Followed by  . [START ON 11/27/2013] LORazepam (ATIVAN) injection 0-4 mg  0-4 mg Intravenous Q12H Rise Patience, MD      . LORazepam (ATIVAN) tablet 1 mg  1 mg Oral Q6H PRN Rise Patience, MD       Or  . LORazepam (ATIVAN) injection 1 mg  1 mg Intravenous Q6H PRN Rise Patience, MD      . methylPREDNISolone sodium succinate (SOLU-MEDROL) 125 mg/2 mL injection 60 mg  60 mg Intravenous BID Melton Alar, PA-C   60 mg at 11/25/13 1011  . multivitamin with minerals tablet 1 tablet  1 tablet Oral Daily Rise Patience, MD   1 tablet at 11/25/13 1012  . nicotine (NICODERM CQ - dosed in mg/24 hours) patch 14 mg  14 mg Transdermal Daily Melton Alar, PA-C   14 mg at 11/25/13 1447  . ondansetron (ZOFRAN) tablet 4 mg  4 mg Oral Q6H PRN Rise Patience, MD       Or  . ondansetron [facility]) injection 4 mg  4 mg Intravenous Q6H PRN Rise Patience, MD   4 mg at 11/25/13 1206  . pantoprazole (PROTONIX) injection 40 mg  40 mg Intravenous Q12H Rise Patience, MD   40 mg at 11/25/13 1032  . [START ON 11/26/2013] pneumococcal 23 valent vaccine (PNU-IMMUNE) injection 0.5 mL  0.5 mL Intramuscular Tomorrow-1000 Shanker Kristeen Mans, MD      . thiamine (VITAMIN B-1) tablet 100 mg  100 mg Oral Daily Rise Patience, MD   100 mg at 11/25/13 1012   Or  . thiamine (B-1) injection 100 mg  100 mg Intravenous Daily Rise Patience, MD        Allergies as of 11/25/2013 - Review Complete 11/25/2013  Allergen Reaction Noted  . Ibuprofen Itching 05/15/2012  . Tramadol Other (See Comments) 07/14/2013    History reviewed. No pertinent family history.  History   Social History  . Marital Status: Single    Spouse Name: N/A    Number of Children: N/A  . Years of Education: N/A   Occupational History  . Not on file.   Social History Main Topics  . Smoking status: Current Every Day Smoker -- 0.25 packs/day for 23 years    Types: Cigarettes  . Smokeless  tobacco: Never Used  . Alcohol Use: 1.2 oz/week    2 Shots of liquor per week  . Drug Use: No  . Sexual Activity: Yes   Other Topics Concern  . Not on file   Social History Narrative  .  No narrative on file    Review of Systems: Gen: Denies any fever, chills, sweats, anorexia, fatigue, weakness, malaise, weight loss, and sleep disorder CV: Denies chest pain, angina, palpitations, syncope, orthopnea, PND, peripheral edema, and claudication. Resp: Denies dyspnea at rest, dyspnea with exercise, cough, sputum, wheezing, coughing up blood, and pleurisy. GI: Denies vomiting blood, jaundice, and fecal incontinence.   Denies dysphagia or odynophagia. GU : Denies urinary burning, blood in urine, urinary frequency, urinary hesitancy, nocturnal urination, and urinary incontinence. MS: Denies joint pain, limitation of movement, and swelling, stiffness, low back pain, extremity pain. Denies muscle weakness, cramps, atrophy.  Derm: Denies rash, itching, dry skin, hives, moles, warts, or unhealing ulcers.  Psych: Denies depression, anxiety, memory loss, suicidal ideation, hallucinations, paranoia, and confusion. Heme: Denies bruising, bleeding, and enlarged lymph nodes. Neuro:  Denies any headaches, dizziness, paresthesias. Endo:  Denies any problems with DM, thyroid, adrenal function.  Physical Exam: Vital signs in last 24 hours: Temp:  [98.1 F (36.7 C)-98.9 F (37.2 C)] 98.9 F (37.2 C) (09/10 1530) Pulse Rate:  [68-113] 98 (09/10 1530) Resp:  [11-18] 18 (09/10 1530) BP: (119-151)/(51-95) 144/77 mmHg (09/10 1530) SpO2:  [92 %-100 %] 99 % (09/10 1530) Weight:  [118 lb 6.4 oz (53.706 kg)-125 lb (56.7 kg)] 118 lb 6.4 oz (53.706 kg) (09/10 0750) Last BM Date: 11/25/13 General:   Alert,  Well-developed,male, well-nourished, pleasant and cooperative in NAD Head:  Normocephalic and atraumatic. Eyes:  Sclera clear, no icterus.   Conjunctiva pink. Ears:  Normal auditory acuity. Nose:  No  deformity, discharge,  or lesions. Mouth:  No deformity or lesions.   Neck:  Supple; no masses or thyromegaly. Lungs:  Clear throughout to auscultation.   No wheezes, crackles, or rhonchi.  Heart:  Regular rate and rhythm; no murmurs Abdomen:  Soft,diffusely tender throughout, BS active,nonpalp mass or hsm, no rebound or guarding Rectal:  Deferred  Msk:  Symmetrical without gross deformities. . Pulses:  Normal pulses noted. Extremities:  Without clubbing or edema. Neurologic:  Alert and  oriented x4;  grossly normal neurologically. Skin:  Intact without significant lesions or rashes.. Psych:  Alert and cooperative. Normal mood and affect.  Intake/Output from previous day: 09/09 0701 - 09/10 0700 In: 1000 [I.V.:1000] Out: -  Intake/Output this shift: Total I/O In: 880.4 [P.O.:485; I.V.:303.8; Blood:91.7] Out: -   Lab Results:  Recent Labs  11/25/13 0118 11/25/13 1130  WBC 8.4 11.6*  HGB 7.0* 8.9*  HCT 25.6* 30.9*  PLT 731* 730*   BMET  Recent Labs  11/25/13 0118 11/25/13 1130  NA 137 136*  K 4.2 4.5  CL 97 94*  CO2 23 18*  GLUCOSE 86 205*  BUN 7 7  CREATININE 0.65 0.58  CALCIUM 8.7 9.1   LFT  Recent Labs  11/25/13 1130  PROT 8.0  ALBUMIN 3.7  AST 71*  ALT 67*  ALKPHOS 88  BILITOT 0.5   Hepatitis Panel drawn 08/03/2013: Hepatitis B Surface Ag NEGATIVE  NEGATIVE   HCV Ab NEGATIVE  NEGATIVE   Hep A IgM NON REACTIVE  NON REACTIVE   Hep B C IgM NON REACTIVE  NON REACTIVE     C difficile by pcr NEGATIVE 9/102015 NEGATIVE             Studies/Results: Dg Abd Acute W/chest  11/25/2013   CLINICAL DATA:  Crohn's disease, lower abdominal pain and nausea, multiple prior abdominal surgeries.  EXAM: ACUTE ABDOMEN SERIES (ABDOMEN 2 VIEW & CHEST 1 VIEW)  COMPARISON:  CT of the abdomen and pelvis October 21, 2013  FINDINGS: Cardiomediastinal silhouette is unremarkable. Lungs are clear, no pleural effusions. No pneumothorax. Soft tissue planes and included  osseous structures are unremarkable.  Bowel gas pattern is nondilated and nonobstructive. A few scattered bowel air-fluid levels without distension. No intra-abdominal mass effect, pathologic calcifications or free air. Soft tissue planes and included osseous structures are nonsuspicious. Lumbar levoscoliosis.  IMPRESSION: No acute cardiopulmonary process.  Nonspecific bowel gas pattern.   Electronically Signed   By: Elon Alas   On: 11/25/2013 03:25   EXAM:  CT ABDOMEN AND PELVIS WITH CONTRAST  TECHNIQUE:  Multidetector CT imaging of the abdomen and pelvis was performed  using the standard protocol following bolus administration of  intravenous contrast.  CONTRAST: 24mL OMNIPAQUE IOHEXOL 300 MG/ML SOLN  COMPARISON: 10/21/2013 plain films, 07/29/2013 CT scan  FINDINGS:  Severe hepatic steatosis. Gallbladder is normal. Spleen is normal.  Pancreas is normal.  Adrenal glands are normal. Kidneys are normal. Bladder is distended.  Mild calcification of the abdominal aorta. No ascites or significant  adenopathy.  Two hundred two severe bladder distention, it is difficult to  evaluate the large bowel. Large bowel appears draped over the  bladder with mass effect on the bowel as a result. There is mild  wall thickening of the rectum, unchanged. The sigmoid colon appears  narrowed tightly for approximately 7 cm. Proximal to this,  descending colon is somewhat distended to a diameter of 5.6 cm, but  more proximal large bowel does not appear to be distended. This  appearance is also unchanged. There is an appearance suggesting that  there has been bowel anastomosis in the region of the distal sigmoid  colon seen best on coronal series image number 56. This area appears  unchanged as well, previously described as an irregular outpouching  or diverticula.  No acute musculoskeletal findings. Moderate levoscoliosis of the  lumbar spine. Visualized portions of the lung bases are clear.  Evidence  of ventral hernia repair over the abdomen.  IMPRESSION:  Unchanged appearance suggesting prior subtotal colectomy with  operative change in the left lower quadrant. Narrowing of the  sigmoid colon with mild proximal bowel distension stable. Appearance  of the sigmoid colon with single diverticula versus focus of  anastomosis, stable. Anorectal wall thickening unchanged.  Electronically Signed  By: Skipper Cliche M.D.  On: 10/21/2013 18:58  IMPRESSION/PLAN: 1. Crohns disease. Symptoms likely due to a flare. Pt is currently on IV solumedrol and imuran. Stool for c diff neg. Pt was noted to have a drop in his H/H and was orthostatic in the ER, and has had positive FOBT. He says he was doing well with regards to his Crohns til he ran out of his imuran 3-4 days ago. Will obtain CT abd/pelvis with contrast to assess disease activity. Check CRP. May need endoscopic eval to assess bleeding source.  2. Anemia. May be due to bleeding from his Crohns. Has had 1 u prbcs and the second is infusing now. Follow cbc. Check folate. Will need po iron. 3.Elevated LFTs. His AST/ALT have been elevated since April, and are likely due to fatty liver or ETOH. Hepatitis panel and HIV earlier this year were neg. Pt will need to stop drinking. 4.Thrombocytosis. Likely reactive due to Crohns flare.   Hvozdovic,  Vita Barley PA-C 11/25/2013  GI ATTENDING  History, laboratories, x-rays reviewed. Very complicated Crohn's disease history with multiple prior surgeries. Some details missing. Fragmented care. Presenting now with chief complaint  of abdominal pain. His pain seems to be out of proportion to physical exam and prior CT findings. He does have a chronic worsening microcytic anemia suggesting possible active Crohn's. At this point, I agree with steroids.Aminosalicylates are not helpful and should not be prescribed. His optimal Imuran dose, assuming TP MT genetics are normal, should be about 150 mg per day based on weight. I  think the next that would be to do a CT scan to see if there is any objective reason for his complaints of pain. Suspect drug-seeking to some degree. In addition to transfusion he should be on chronic iron. I might colonoscopy would be helpful to reevaluate his colonic and ileal mucosa for active disease. If active disease present, he may be an ideal candidate for biologic agents, particularly given the extensive surgery he has previously undergone.  Docia Chuck. Geri Seminole., M.D. [facility] Gastroenterology

## 2013-11-25 NOTE — Care Management Note (Signed)
    Page 1 of 2   11/29/2013     3:10:23 PM CARE MANAGEMENT NOTE 11/29/2013  Patient:  [place], WENZLICK   Account Number:  [id number]  Date Initiated:  11/25/2013  Documentation initiated by:  Tomi Bamberger  Subjective/Objective Assessment:   dx crohns flare  admit- lives with neice-pta indep.     Action/Plan:   Anticipated DC Date:  11/29/2013   Anticipated DC Plan:  HOME/SELF CARE  In-house referral  Clinical Social Worker      DC Forensic scientist  CM consult  Medication [facility]      Choice offered to / List presented to:             Status of service:  Completed, signed off Medicare Important Message given?  NO (If response is "NO", the following Medicare IM given date fields will be blank) Date Medicare IM given:   Medicare IM given by:   Date Additional Medicare IM given:   Additional Medicare IM given by:    Discharge Disposition:  HOME/SELF CARE  Per UR Regulation:  Reviewed for med. necessity/level of care/duration of stay  If discussed at [place] of Stay Meetings, dates discussed:    Comments:  11/29/13 [place] RN,BSN [phone] patient is for dc today, patient has had ast with the [facility] program,  NCM called over to [facility] to see how much pt would have to pay for scripts, most of them were $4 and one was $10.  Patient will go directly there from hospital to get scripts filled and he will go to [facility] to get the suppositories for $4.  Patient has f/u apt on 9/16.  11/26/13 [place], BSN [phone] patient will need ast with medications, will inform weekend NCM.  11/25/13 [place], BSN [phone] patient lives with neice.  Patient states he needs a bus pass to get home at dc, CSW aware.  NCM gave patient information about [facility] ,will make appt tomorrow for patient.  Patient states he has used the [employer] before. NCM will need to see what meds MD will want to dc patient on to see if  he will require ast.  NCM called financial counselor to speak with patient about medicaid.

## 2013-11-25 NOTE — ED Notes (Signed)
Pt requesting pain medication.  

## 2013-11-25 NOTE — Progress Notes (Addendum)
PROGRESS NOTE  Benjamin Shelton XBJ:[id number] DOB: [birth year] DOA: 11/25/2013 PCP: No PCP Per Patient  Subjective: Has seen some improvement since yesterday. Still sharp abdominal pain while moving/bending, but not painful at rest. Had formed stool for the first time this morning. Vomited one time around 7:30 this morning. Diet has been advanced to clear fluids. Drank broth and had a few sodas starting around 8:00 and handling it well so far (about 2 hours). Eager to eat some food.   Important note- Patient states that the symptoms started shortly after he ran out of his Imuran. Had been out for 3 days before getting here.    Assessment/Plan: Crohn Disease Exacerabtion -Reports he did well after d/c in August until 3 days ago when he ran out of imuran. -Improvement. Continue Continue Imuran and Solu-Medrol -soft diet.  -GI consulted.   Last colonoscopy was in 2013 per his report.   Microcytic Anemia -Might be due to abdominal bleeding from Crohns- occult fecal blood test positive -Baseline Hgb appears to be approx. 9.0.  In April of 2014 Hgb was 15 - 16 range. -Continue protonix -Received 1 unit PRBC, a second one is ordered. -Follow CBC.  -Continue folic acid  Elevated LFT's -Patients AST and ALT have been elevated since April. Likely chronic problem due to alcohol or fatty liver. -Acute Hepatitis panel and HIV were negative on recent check.  Thrombocytosis -Might be due to crohn's exacerbation - reactive. -Recheck labs 9/11 and monitor.  Alcohol abuse -Showing no withdrawal symptoms -continue thiamine and folic acid, and administer ativan if needed  Tobacco abuse -Patient was advised to quit smoking -Nicotine Patch.  DVT Prophylaxis:  SCD's   Code Status: Full code Family Communication: No family here. Patient is alert and understands instruction. Disposition Plan: To home    Consultants:  GI, Dr. Ulyses Amor office has been  contacted.  Procedures:  None  Antibiotics:  None  Objective: Filed Vitals:   11/25/13 0715 11/25/13 0718 11/25/13 0750 11/25/13 1317  BP: 135/59 135/59 151/77 144/79  Pulse: 93 105 68 78  Temp:   98.5 F (36.9 C) 98.2 F (36.8 C)  TempSrc:   Oral Oral  Resp:  17 18 18   Height:   5\' 3"  (1.6 m)   Weight:   53.706 kg (118 lb 6.4 oz)   SpO2: 96% 97% 98% 100%    Intake/Output Summary (Last 24 hours) at 11/25/13 1320 Last data filed at 11/25/13 1200  Gross per 24 hour  Intake 1308.75 ml  Output      0 ml  Net 1308.75 ml   Filed Weights   11/25/13 0105 11/25/13 0750  Weight: 56.7 kg (125 lb) 53.706 kg (118 lb 6.4 oz)    Exam: General: Altert and cooperative, NAD, appears stated age, appears slightly undernourished and tremulous. HEENT:  Anicteic Sclera, MMM. No pharyngeal erythema or exudates  Neck: Supple, no JVD, no masses  Cardiovascular: RRR, S1 S2 auscultated, no rubs, murmurs or gallops.   Respiratory: Clear to auscultation bilaterally with equal chest rise  Abdomen: Soft abdomen, nondistended, + bowel sounds, tender right middle and lower abdomen  Extremities: warm dry without cyanosis clubbing or edema.  Neuro: AAOx3, Strength 5/5 in upper and lower extremities  Skin: Without rashes exudates or nodules, skin tone normal  Psych: Normal affect and demeanor with intact judgement and insight   Data Reviewed: Basic Metabolic Panel:  Recent Labs Lab 11/25/13 0118 11/25/13 1130  NA 137 136*  K 4.2 4.5  CL  97 94*  CO2 23 18*  GLUCOSE 86 205*  BUN 7 7  CREATININE 0.65 0.58  CALCIUM 8.7 9.1   Liver Function Tests:  Recent Labs Lab 11/25/13 0118 11/25/13 1130  AST 105* 71*  ALT 70* 67*  ALKPHOS 77 88  BILITOT 0.2* 0.5  PROT 7.3 8.0  ALBUMIN 3.6 3.7   CBC:  Recent Labs Lab 11/25/13 0118 11/25/13 1130  WBC 8.4 11.6*  NEUTROABS 4.6 10.9*  HGB 7.0* 8.9*  HCT 25.6* 30.9*  MCV 67.9* 73.0*  PLT 731* 730*     Studies: Dg Abd Acute  W/chest  11/25/2013   CLINICAL DATA:  Crohn's disease, lower abdominal pain and nausea, multiple prior abdominal surgeries.  EXAM: ACUTE ABDOMEN SERIES (ABDOMEN 2 VIEW & CHEST 1 VIEW)  COMPARISON:  CT of the abdomen and pelvis October 21, 2013  FINDINGS: Cardiomediastinal silhouette is unremarkable. Lungs are clear, no pleural effusions. No pneumothorax. Soft tissue planes and included osseous structures are unremarkable.  Bowel gas pattern is nondilated and nonobstructive. A few scattered bowel air-fluid levels without distension. No intra-abdominal mass effect, pathologic calcifications or free air. Soft tissue planes and included osseous structures are nonsuspicious. Lumbar levoscoliosis.  IMPRESSION: No acute cardiopulmonary process.  Nonspecific bowel gas pattern.   Electronically Signed   By: Elon Alas   On: 11/25/2013 03:25    Scheduled Meds: . azaTHIOprine  50 mg Oral Daily  . folic acid  1 mg Oral Daily  . [START ON 11/26/2013] Influenza vac split quadrivalent PF  0.5 mL Intramuscular Tomorrow-1000  . LORazepam  0-4 mg Intravenous Q6H   Followed by  . [START ON 11/27/2013] LORazepam  0-4 mg Intravenous Q12H  . methylPREDNISolone (SOLU-MEDROL) injection  60 mg Intravenous BID  . multivitamin with minerals  1 tablet Oral Daily  . nicotine  14 mg Transdermal Daily  . pantoprazole (PROTONIX) IV  40 mg Intravenous Q12H  . [START ON 11/26/2013] pneumococcal 23 valent vaccine  0.5 mL Intramuscular Tomorrow-1000  . thiamine  100 mg Oral Daily   Or  . thiamine  100 mg Intravenous Daily   Continuous Infusions: . sodium chloride 75 mL/hr at 11/25/13 0757    Active Problems:   Crohn disease   Microcytic hypochromic anemia   Exacerbation of Crohn's disease   Alcohol abuse   Tobacco abuse   Darcella Gasman PA-S Imogene Burn, [place]  [facility] Pager [phone]. If 7PM-7AM, please contact night-coverage at [email or web address], password [facility] 11/25/2013, 1:20 PM  LOS: 0 days    Attending Patient was seen, examined,treatment plan was discussed with the Physician extender. I have directly reviewed the clinical findings, lab, imaging studies and management of this patient in detail. I have made the necessary changes to the above noted documentation, and agree with the documentation, as recorded by the Physician extender.  Nena Alexander MD [facility].

## 2013-11-25 NOTE — Progress Notes (Signed)
Benjamin Shelton [id number] Code Status: Full Admission Data: 11/25/2013 8:04 AM Attending Provider:  Mayo Ao PCP:No PCP Per Patient Consults/ Treatment Team:    [name] is a 33 y.o. male patient admitted from ED awake, alert - oriented  X 3 - no acute distress noted.  VSS - Blood pressure 151/77, pulse 68, temperature 98.5 F (36.9 C), temperature source Oral, resp. rate 18, height 5\' 3"  (1.6 m), weight 53.706 kg (118 lb 6.4 oz), SpO2 98.00%.    IV in place, occlusive dsg intact without redness.   Orientation to room, and floor completed with information packet given to patient/family.  Patient declined safety video at this time.  Admission INP armband ID verified with patient/family, and in place.   SR up x 2, fall assessment complete, with patient and family able to verbalize understanding of risk associated with falls, and verbalized understanding to call nsg before up out of bed.  Call light within reach, patient able to voice, and demonstrate understanding.  Skin, clean-dry- intact without evidence of bruising, or skin tears.   No evidence of skin break down noted on exam.     Will cont to eval and treat per MD orders.  Delman Cheadle, RN 11/25/2013 8:04 AM

## 2013-11-25 NOTE — ED Notes (Signed)
Pt up with this RN to bedside commode per EDP approval, pt's HR increased to 148. EDP informed.

## 2013-11-25 NOTE — Progress Notes (Signed)
On ambulation patient's HR with be sinus tach 140-160. Following ambulation normal sinus and HR 80-90. Dr. Sloan Leiter made aware. Will keep patient on cardiac monitor.

## 2013-11-25 NOTE — ED Notes (Signed)
POCT occult POSITIVE. EDP informed.

## 2013-11-25 NOTE — ED Notes (Signed)
Pt called out asking for pain medication. This RN told him that the doctor was with another patient, and that she would ask for more pain medication when the doctor became available.

## 2013-11-25 NOTE — ED Notes (Signed)
Pt reports he is out of his medications (Imuran, prednisone) that he usually takes for Crohn's dx. Pt also reports he has has a productive cough, sputum is clear, as well as clear nasal drainage.

## 2013-11-25 NOTE — ED Notes (Signed)
Pt reports his nausea has returned.

## 2013-11-25 NOTE — ED Notes (Signed)
MD at bedside. 

## 2013-11-25 NOTE — Progress Notes (Signed)
[place], PA made aware of CDiff result negative. Verbal order to d/c enteric precautions.

## 2013-11-25 NOTE — ED Notes (Signed)
Pt placed on 2LPM o2 via [place] per RN.

## 2013-11-25 NOTE — ED Notes (Signed)
Pt reports he feels nauseous again, EDP informed.

## 2013-11-25 NOTE — ED Notes (Addendum)
Pt presents to the department with severe RLQ pain, pt reports 7 episodes of diarrhea since yesterday morning. Pt reports nausea and one episode of vomiting yesterday. Pt reports the pain has increased greatly since yesterday morning. Pt reports hx of Crohn's dx with last flare up about 4-6 weeks ago. Pt denies any problems voiding. Pt is A&O X4.

## 2013-11-25 NOTE — H&P (Addendum)
[facility] History and Physical  Benjamin Shelton IOE:[id number] DOB: 03-09-1981 DOA: 11/25/2013  Referring physician: ER physician. PCP: No PCP Per Patient   Chief Complaint: Vomiting and diarrhea with abdominal pain.  HPI: Benjamin Shelton is a 33 y.o. male history of Crohn's disease who was recently admitted to the hospital and was on steroids taper presents to the ER with complaints of nausea vomiting diarrhea and abdominal pain. Patient's symptoms have been present over the last 2 days and patient had just finished a tapering dose of steroids. Patient denies any blood in the vomitus or stools. In the ER patient was found to have hemoglobin less than his usual with patient orthostatic. Stool for occult blood was positive. Patient states his abdominal pain is mostly the right lower quadrant. Denies any fever chills. Acute abdominal series has been unremarkable. Patient has been admitted for possible Crohn's exacerbation and transfusion of PRBC for worsening anemia. Patient has just recently moved from [place] 9 months ago and has not established care over here. Patient admits to drinking alcohol every other day and last drink was yesterday.  Review of Systems: As presented in the history of presenting illness, rest negative.  Past Medical History  Diagnosis Date  . Crohn's disease dx'd 1991   Past Surgical History  Procedure Laterality Date  . Hernia repair      "stomach"  . Colostomy    . Partial colectomy    . Colostomy reversal     Social History:  reports that he has been smoking Cigarettes.  He has a 5.75 pack-year smoking history. He has never used smokeless tobacco. He reports that he drinks about 1.2 ounces of alcohol per week. He reports that he does not use illicit drugs. Where does patient live home. Can patient participate in ADLs? Yes.  Allergies  Allergen Reactions  . Ibuprofen Itching  . Tramadol Other (See Comments)    irritates chrons     Family  History: History reviewed. No pertinent family history.    Prior to Admission medications   Medication Sig Start Date End Date Taking? Authorizing Provider  azaTHIOprine (IMURAN) 50 MG tablet Take 1 tablet (50 mg total) by mouth daily. 10/23/13  Yes Reyne Dumas, MD  folic acid (FOLVITE) 1 MG tablet Take 1 tablet (1 mg total) by mouth daily. 10/23/13  Yes Reyne Dumas, MD  thiamine 100 MG tablet Take 1 tablet (100 mg total) by mouth daily. 10/23/13  Yes Reyne Dumas, MD    Physical Exam: Filed Vitals:   11/25/13 0545 11/25/13 0559 11/25/13 0600 11/25/13 0615  BP: 134/73  148/85 140/71  Pulse: 99   104  Temp:  98.1 F (36.7 C)  98.3 F (36.8 C)  TempSrc:  Oral  Oral  Resp:   16 16  Height:      Weight:      SpO2:    93%     General:  Moderately built and nourished.  Eyes: Anicteric no pallor.  ENT: No discharge from the ears eyes nose mouth.  Neck: No mass felt.  Cardiovascular: S1-S2 heard.  Respiratory: No rhonchi or crepitations.  Abdomen: Mild tenderness in the right lower quadrant no guarding or rigidity.  Skin: No rash.  Musculoskeletal: No edema.  Psychiatric: Appears normal.  Neurologic: Alert awake oriented to time place and person. Moves all extremities.  Labs on Admission:  Basic Metabolic Panel:  Recent Labs Lab 11/25/13 0118  NA 137  K 4.2  CL 97  CO2 23  GLUCOSE  86  BUN 7  CREATININE 0.65  CALCIUM 8.7   Liver Function Tests:  Recent Labs Lab 11/25/13 0118  AST 105*  ALT 70*  ALKPHOS 77  BILITOT 0.2*  PROT 7.3  ALBUMIN 3.6   No results found for this basename: LIPASE, AMYLASE,  in the last 168 hours No results found for this basename: AMMONIA,  in the last 168 hours CBC:  Recent Labs Lab 11/25/13 0118  WBC 8.4  NEUTROABS 4.6  HGB 7.0*  HCT 25.6*  MCV 67.9*  PLT 731*   Cardiac Enzymes: No results found for this basename: CKTOTAL, CKMB, CKMBINDEX, TROPONINI,  in the last 168 hours  BNP (last 3 results) No results found  for this basename: PROBNP,  in the last 8760 hours CBG: No results found for this basename: GLUCAP,  in the last 168 hours  Radiological Exams on Admission: Dg Abd Acute W/chest  11/25/2013   CLINICAL DATA:  Crohn's disease, lower abdominal pain and nausea, multiple prior abdominal surgeries.  EXAM: ACUTE ABDOMEN SERIES (ABDOMEN 2 VIEW & CHEST 1 VIEW)  COMPARISON:  CT of the abdomen and pelvis October 21, 2013  FINDINGS: Cardiomediastinal silhouette is unremarkable. Lungs are clear, no pleural effusions. No pneumothorax. Soft tissue planes and included osseous structures are unremarkable.  Bowel gas pattern is nondilated and nonobstructive. A few scattered bowel air-fluid levels without distension. No intra-abdominal mass effect, pathologic calcifications or free air. Soft tissue planes and included osseous structures are nonsuspicious. Lumbar levoscoliosis.  IMPRESSION: No acute cardiopulmonary process.  Nonspecific bowel gas pattern.   Electronically Signed   By: Elon Alas   On: 11/25/2013 03:25    Assessment/Plan Active Problems:   Crohn disease   Microcytic hypochromic anemia   Exacerbation of Crohn's disease   1. Nausea vomiting diarrhea with abdominal pain most likely secondary to Crohn's exacerbation - patient did receive IV Solu-Medrol in the ER and I have placed patient on Solu-Medrol 40 mg IV daily and on clear liquid. Continue Imuran. Check stool studies as patient has been having increasing diarrhea. Consult GI in a.m. Patient will be on gentle hydration and pain relief medications. Since patient's hemoglobin has dropped from his usual and patient is orthostatic 2 units of PRBC has been ordered. MRI of the abdomen has been ordered. 2. Anemia with gi bleed -  I suspect gi bleed form colitis. Patient is receiving 2 units PRBC transfusion. Protonix for now. Follow CBC closely. 3. Thrombocytosis probably reactionary. 4. Elevated LFTs - patient has had hepatitis panel done in May of  this year which was negative. Follow LFTs closely. May be related to alcohol. Check INR. 5. Patient drinks beer every other day - patient has been placed on when necessary Ativan and thiamine. 6. Tobacco abuse - patient advised to quit smoking.    Code Status: Full code.  Family Communication: None.  Disposition Plan: Admit to inpatient.    Augusta Hilbert N. [facility] Pager [phone].  If 7PM-7AM, please contact night-coverage [email or web address] Password [facility] 11/25/2013, 6:26 AM

## 2013-11-25 NOTE — Progress Notes (Signed)
Report received for admission to [phone]

## 2013-11-25 NOTE — ED Provider Notes (Signed)
CSN: [id number]     Arrival date & time 11/25/13  0056 History   First MD Initiated Contact with Patient 11/25/13 0241     Chief Complaint  Patient presents with  . Abdominal Pain  . Diarrhea     (Consider location/radiation/quality/duration/timing/severity/associated sxs/prior Treatment) HPI 33 year old male presents to the emergency department from home with complaint of right lower quadrant  abdominal pain and diarrhea.  Patient has history of Crohn's disease.  He reports that he ran out of his Imuran on Monday, finished a steroid taper 4 days ago.  Symptoms started yesterday morning.  They have been severe.  He reports nausea and vomiting times one.  He denies any fever or chills.  He reports feeling generalized weakness.  Patient does not have a gastroenterologist that he sees.  He has not yet been able to followup with primary care Dr. Past Medical History  Diagnosis Date  . Crohn's disease dx'd 1991   Past Surgical History  Procedure Laterality Date  . Hernia repair      "stomach"  . Colostomy    . Partial colectomy    . Colostomy reversal     No family history on file. History  Substance Use Topics  . Smoking status: Current Every Day Smoker -- 0.25 packs/day for 23 years    Types: Cigarettes  . Smokeless tobacco: Never Used  . Alcohol Use: 1.2 oz/week    2 Shots of liquor per week    Review of Systems  See History of Present Illness; otherwise all other systems are reviewed and negative   Allergies  Ibuprofen and Tramadol  Home Medications   Prior to Admission medications   Medication Sig Start Date End Date Taking? Authorizing Provider  azaTHIOprine (IMURAN) 50 MG tablet Take 1 tablet (50 mg total) by mouth daily. 10/23/13  Yes Reyne Dumas, MD  folic acid (FOLVITE) 1 MG tablet Take 1 tablet (1 mg total) by mouth daily. 10/23/13  Yes Reyne Dumas, MD  thiamine 100 MG tablet Take 1 tablet (100 mg total) by mouth daily. 10/23/13  Yes Reyne Dumas, MD   BP 133/75   Pulse 99  Temp(Src) 98.1 F (36.7 C) (Oral)  Resp 14  Ht 5' 3.5" (1.613 m)  Wt 125 lb (56.7 kg)  BMI 21.79 kg/m2  SpO2 94% Physical Exam  Nursing note and vitals reviewed. Constitutional: He is oriented to person, place, and time. He appears well-developed and well-nourished. He appears distressed.  HENT:  Head: Normocephalic and atraumatic.  Nose: Nose normal.  Mouth/Throat: Oropharynx is clear and moist.  Eyes: Conjunctivae and EOM are normal. Pupils are equal, round, and reactive to light.  Neck: Normal range of motion. Neck supple. No JVD present. No tracheal deviation present. No thyromegaly present.  Cardiovascular: Normal rate, regular rhythm, normal heart sounds and intact distal pulses.  Exam reveals no gallop and no friction rub.   No murmur heard. Pulmonary/Chest: Effort normal and breath sounds normal. No stridor. No respiratory distress. He has no wheezes. He has no rales. He exhibits no tenderness.  Abdominal: Soft. He exhibits no distension and no mass. There is tenderness (patient has significant tenderness throughout the abdomen worse in right lower quadrant). There is no rebound and no guarding.  Hyperactive bowel sounds  Genitourinary: Guaiac positive stool.  Musculoskeletal: Normal range of motion. He exhibits no edema and no tenderness.  Lymphadenopathy:    He has no cervical adenopathy.  Neurological: He is alert and oriented to person, place, and time.  He displays normal reflexes. He exhibits normal muscle tone. Coordination normal.  Skin: Skin is warm and dry. No rash noted. No erythema. No pallor.  Psychiatric: He has a normal mood and affect. His behavior is normal. Judgment and thought content normal.    ED Course  Procedures (including critical care time) Labs Review Labs Reviewed  CBC WITH DIFFERENTIAL - Abnormal; Notable for the following:    RBC 3.77 (*)    Hemoglobin 7.0 (*)    HCT 25.6 (*)    MCV 67.9 (*)    MCH 18.6 (*)    MCHC 27.3 (*)     RDW 20.5 (*)    Platelets 731 (*)    Monocytes Relative 16 (*)    Monocytes Absolute 1.3 (*)    Basophils Relative 3 (*)    Basophils Absolute 0.2 (*)    All other components within normal limits  COMPREHENSIVE METABOLIC PANEL - Abnormal; Notable for the following:    AST 105 (*)    ALT 70 (*)    Total Bilirubin 0.2 (*)    Anion gap 17 (*)    All other components within normal limits  POC OCCULT BLOOD, ED - Abnormal; Notable for the following:    Fecal Occult Bld POSITIVE (*)    All other components within normal limits  OCCULT BLOOD X 1 CARD TO LAB, STOOL  TYPE AND SCREEN  ABO/RH  PREPARE RBC (CROSSMATCH)    Imaging Review Dg Abd Acute W/chest  11/25/2013   CLINICAL DATA:  Crohn's disease, lower abdominal pain and nausea, multiple prior abdominal surgeries.  EXAM: ACUTE ABDOMEN SERIES (ABDOMEN 2 VIEW & CHEST 1 VIEW)  COMPARISON:  CT of the abdomen and pelvis October 21, 2013  FINDINGS: Cardiomediastinal silhouette is unremarkable. Lungs are clear, no pleural effusions. No pneumothorax. Soft tissue planes and included osseous structures are unremarkable.  Bowel gas pattern is nondilated and nonobstructive. A few scattered bowel air-fluid levels without distension. No intra-abdominal mass effect, pathologic calcifications or free air. Soft tissue planes and included osseous structures are nonsuspicious. Lumbar levoscoliosis.  IMPRESSION: No acute cardiopulmonary process.  Nonspecific bowel gas pattern.   Electronically Signed   By: Elon Alas   On: 11/25/2013 03:25     EKG Interpretation None     CRITICAL CARE Performed by: Kalman Drape Total critical care time: 60 min Critical care time was exclusive of separately billable procedures and treating other patients. Critical care was necessary to treat or prevent imminent or life-threatening deterioration. Critical care was time spent personally by me on the following activities: development of treatment plan with patient and/or  surrogate as well as nursing, discussions with consultants, evaluation of patient's response to treatment, examination of patient, obtaining history from patient or surrogate, ordering and performing treatments and interventions, ordering and review of laboratory studies, ordering and review of radiographic studies, pulse oximetry and re-evaluation of patient's condition.  MDM   Final diagnoses:  Crohn's disease, other complication  Iron deficiency anemia, unspecified  Symptomatic anemia  Gastrointestinal hemorrhage, unspecified gastritis, unspecified gastrointestinal hemorrhage type    33 year old male with abdominal pain, history of Crohn's status post partial colectomy and colostomy reversal with right lower quadrant pain and diarrhea.  Appears to be a Crohn's flare.  Patient's transaminases are noted to be elevated.  Patient reports he drinks daily, usually just one beer.  Patient does not have followup with primary care or GI Dr.  His H&H has dropped since his last admission, patient not taking  iron and has heme positive stools.  Will discuss with hospitalist for admission and transfusion.  Kalman Drape, MD 11/25/13 [phone]

## 2013-11-26 DIAGNOSIS — E43 Unspecified severe protein-calorie malnutrition: Secondary | ICD-10-CM | POA: Diagnosis present

## 2013-11-26 DIAGNOSIS — F17200 Nicotine dependence, unspecified, uncomplicated: Secondary | ICD-10-CM

## 2013-11-26 DIAGNOSIS — R933 Abnormal findings on diagnostic imaging of other parts of digestive tract: Secondary | ICD-10-CM

## 2013-11-26 LAB — TYPE AND SCREEN
ABO/RH(D): O POS
Antibody Screen: NEGATIVE
Unit division: 0
Unit division: 0

## 2013-11-26 LAB — CBC
HEMATOCRIT: 35.5 % — AB (ref 39.0–52.0)
Hemoglobin: 10.6 g/dL — ABNORMAL LOW (ref 13.0–17.0)
MCH: 21.7 pg — ABNORMAL LOW (ref 26.0–34.0)
MCHC: 29.9 g/dL — AB (ref 30.0–36.0)
MCV: 72.6 fL — ABNORMAL LOW (ref 78.0–100.0)
Platelets: 669 10*3/uL — ABNORMAL HIGH (ref 150–400)
RBC: 4.89 MIL/uL (ref 4.22–5.81)
RDW: 20.6 % — AB (ref 11.5–15.5)
WBC: 9.8 10*3/uL (ref 4.0–10.5)

## 2013-11-26 LAB — BASIC METABOLIC PANEL
Anion gap: 16 — ABNORMAL HIGH (ref 5–15)
BUN: 8 mg/dL (ref 6–23)
CALCIUM: 9.7 mg/dL (ref 8.4–10.5)
CO2: 27 meq/L (ref 19–32)
CREATININE: 0.67 mg/dL (ref 0.50–1.35)
Chloride: 95 mEq/L — ABNORMAL LOW (ref 96–112)
GFR calc Af Amer: >90 mL/min (ref >90)
GFR calc non Af Amer: >90 mL/min (ref >90)
GLUCOSE: 103 mg/dL — AB (ref 70–99)
Potassium: 4.2 mEq/L (ref 3.7–5.3)
Sodium: 138 mEq/L (ref 137–147)

## 2013-11-26 LAB — C-REACTIVE PROTEIN: CRP: <0.5 mg/dL — ABNORMAL LOW (ref <0.60)

## 2013-11-26 MED ORDER — PEG-KCL-NACL-NASULF-NA ASC-C 100 G PO SOLR
1.0000 | Freq: Once | ORAL | Status: AC
  Administered 2013-11-26: 200 g via ORAL
  Filled 2013-11-26: qty 1

## 2013-11-26 MED ORDER — BISACODYL 5 MG PO TBEC: 5.0000 mg | DELAYED_RELEASE_TABLET | Freq: Every day | ORAL | Status: DC | PRN

## 2013-11-26 MED ORDER — OXYCODONE HCL 5 MG PO TABS
10.0000 mg | ORAL_TABLET | ORAL | Status: DC | PRN
  Administered 2013-11-26 – 2013-11-29 (×16): 10 mg via ORAL
  Filled 2013-11-26 (×17): qty 2

## 2013-11-26 MED ORDER — FERROUS SULFATE 325 (65 FE) MG PO TABS
325.0000 mg | ORAL_TABLET | Freq: Every day | ORAL | Status: DC
  Administered 2013-11-26: 325 mg via ORAL
  Filled 2013-11-26 (×2): qty 1

## 2013-11-26 NOTE — Progress Notes (Addendum)
PROGRESS NOTE  Benjamin Shelton IRC:[id number] DOB: [birth year] DOA: 11/25/2013 PCP: No PCP Per Patient  HPI/Subjective: 33 year old male with onset of abdominal pain, nausea, vomiting, and diarrhea since Wednesday. History of Crohn's, multiple hernia repairs, colectomy, and colostomy. Problem started 3 or 4 days after he ran out of the Imuran he takes for his Crohn's. He is no longer having diarrhea or vomiting. He is handling solid foods well. He is still having intermittent sharp abdominal pain that may last for up to 30 minutes at a time that can occur unprovoked or upon movement.    Assessment/Plan:  Crohn's Disease Exacerabation -Symptom improvement, but still abdominal pain. Continue Imuran and Solu-Medrol -GI consulted and suggested potential colonoscopy.  -Tolerated soft diet well, ambulating about. -Decreasing IV pain medications.  Progressing to PO pain medications.  Acute Blood Loss Anemia  -secondary to bleeding from Crohn's-however no overt bleeding evident -Received 2 units of PRBCs -Appreciate [place] [facility] consult.  Rec:  PO iron, possible endoscopic eval. -Baseline Hgb appears to be approx. 9.0. In April of 2014 Hgb was 15 - 16 range.  -Continue protonix  -Follow CBC.   Elevated LFT's  -Patients AST and ALT have been elevated since April. Likely chronic problem due to alcohol or fatty liver.  -Acute Hepatitis panel and HIV were negative on recent check.   Thrombocytosis -Might be due to crohn's exacerbation - reactive.  -trending down. -Continue to monitor CBC results -ensure use of SCD's  Alcohol abuse  -continue thiamine and folic acid, and administer ativan if needed   Tobacco abuse  -Patient was advised to quit smoking  - On nicotine patch.   DVT Prophylaxis: SCD's    Code Status: Full code  Family Communication: No family here. Patient is alert and understands instruction.  Disposition Plan: To home   Consultants:  -GI   Procedures:   none  Antibiotics:  Anti-infectives   None          Objective: Filed Vitals:   11/25/13 1626 11/25/13 1718 11/26/13 0000 11/26/13 0547  BP: 145/64 144/71 148/76 152/92  Pulse: 80 93 66 65  Temp: 98.2 F (36.8 C) 99.2 F (37.3 C) 98.2 F (36.8 C) 98.7 F (37.1 C)  TempSrc: Oral Oral Oral Oral  Resp: $Remo'18 18 18   'zzgTd$ Height:      Weight:      SpO2: 99% 99% 98% 100%    Intake/Output Summary (Last 24 hours) at 11/26/13 1041 Last data filed at 11/26/13 0815  Gross per 24 hour  Intake 1527.09 ml  Output    400 ml  Net 1127.09 ml   Filed Weights   11/25/13 0105 11/25/13 0750  Weight: 56.7 kg (125 lb) 53.706 kg (118 lb 6.4 oz)    Exam: General: Alert, NAD, appears stated age,  undernourished, tremulous   HEENT:  Anicteic Sclera, MMM. No pharyngeal erythema or exudates  Neck: Supple, no JVD, no masses  Cardiovascular: RRR, S1 S2 auscultated, no rubs, murmurs or gallops.   Respiratory: Clear to auscultation bilaterally with equal chest rise  Abdomen: Soft, nondistended, + bowel sounds, tender middle and lower abdomen.  Multiple scars, appearance of metal from previous hernia repair beginning to protrude thru his tissue. Extremities: warm dry without cyanosis clubbing or edema.  Neuro: AAOx3, Strength 5/5 in upper and lower extremities  Skin: Without rashes exudates or nodules.   Psych: anxious, pacing the hallways, poor insight.  Requesting pain medications while eating a solid diet.  Data Reviewed: Basic Metabolic Panel:  Recent Labs Lab 11/25/13 0118 11/25/13 1130 11/26/13 0735  NA 137 136* 138  K 4.2 4.5 4.2  CL 97 94* 95*  CO2 23 18* 27  GLUCOSE 86 205* 103*  BUN $Re'7 7 8  'PgR$ CREATININE 0.65 0.58 0.67  CALCIUM 8.7 9.1 9.7   Liver Function Tests:  Recent Labs Lab 11/25/13 0118 11/25/13 1130  AST 105* 71*  ALT 70* 67*  ALKPHOS 77 88  BILITOT 0.2* 0.5  PROT 7.3 8.0  ALBUMIN 3.6 3.7    Recent Labs Lab 11/25/13 1845  LIPASE 25  AMYLASE 251*    CBC:  Recent Labs Lab 11/25/13 0118 11/25/13 1130 11/26/13 0735  WBC 8.4 11.6* 9.8  NEUTROABS 4.6 10.9*  --   HGB 7.0* 8.9* 10.6*  HCT 25.6* 30.9* 35.5*  MCV 67.9* 73.0* 72.6*  PLT 731* 730* 669*     Recent Results (from the past 240 hour(s))  CLOSTRIDIUM DIFFICILE BY PCR     Status: None   Collection Time    11/25/13 10:37 AM      Result Value Ref Range Status   C difficile by pcr NEGATIVE  NEGATIVE Final     Studies: Ct Abdomen Pelvis W Contrast  11/25/2013   CLINICAL DATA:  Abdominal pain. Nausea, vomiting, and diarrhea since Wednesday. Rectal bleeding. History of Crohn's.  EXAM: CT ABDOMEN AND PELVIS WITH CONTRAST  TECHNIQUE: Multidetector CT imaging of the abdomen and pelvis was performed using the standard protocol following bolus administration of intravenous contrast.  CONTRAST:  122mL OMNIPAQUE IOHEXOL 300 MG/ML  SOLN  COMPARISON:  10/21/2013  FINDINGS: Mild dependent atelectasis in the lung bases.  Diffuse fatty infiltration of the liver. Gallbladder, spleen, pancreas, adrenal glands, abdominal aorta, inferior vena cava, and retroperitoneal lymph nodes are unremarkable. Small accessory spleen. Stomach is unremarkable. Proximal small bowel are well filled with contrast material in demonstrate no evidence of distention or significant wall thickening. Distal small bowel loops are decompressed and therefore difficult to evaluate. Postoperative changes with anterior abdominal wall mass consistent with hernia repair. 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 No free air or free fluid in the abdomen. Tiny punctate stone demonstrated in the lower pole of the right kidney. No evidence of hydronephrosis in either kidney.  Pelvis: Rectal wall thickening and edema at, likely inflammatory. No free or loculated pelvic fluid collections. Bladder wall is not thickened. No pelvic mass or lymphadenopathy. No free or loculated pelvic fluid collections. Mild  prominence of lymph nodes in the groin regions without pathologic enlargement, likely reactive. Lumbar scoliosis convex towards the left. No destructive bone lesions. Scattered areas of benign-appearing sclerosis in the pelvis.  IMPRESSION: Postoperative subtotal colectomy with ileal sigmoid anastomosis. Wall thickening in the rectum, likely inflammatory. No abscess. Visualized small bowel is unremarkable although distal small bowel are decompressed and difficult to evaluate for inflammatory changes. Fatty infiltration of the liver. Probable punctate nonobstructing stone in the right kidney.   Electronically Signed   By: Lucienne Capers M.D.   On: 11/25/2013 22:33   Dg Abd Acute W/chest  11/25/2013   CLINICAL DATA:  Crohn's disease, lower abdominal pain and nausea, multiple prior abdominal surgeries.  EXAM: ACUTE ABDOMEN SERIES (ABDOMEN 2 VIEW & CHEST 1 VIEW)  COMPARISON:  CT of the abdomen and pelvis October 21, 2013  FINDINGS: Cardiomediastinal silhouette is unremarkable. Lungs are clear, no pleural effusions. No pneumothorax. Soft tissue planes and included osseous structures are unremarkable.  Bowel gas pattern  is nondilated and nonobstructive. A few scattered bowel air-fluid levels without distension. No intra-abdominal mass effect, pathologic calcifications or free air. Soft tissue planes and included osseous structures are nonsuspicious. Lumbar levoscoliosis.  IMPRESSION: No acute cardiopulmonary process.  Nonspecific bowel gas pattern.   Electronically Signed   By: Elon Alas   On: 11/25/2013 03:25    Scheduled Meds: . azaTHIOprine  50 mg Oral Daily  . folic acid  1 mg Oral Daily  . LORazepam  0-4 mg Intravenous Q6H   Followed by  . [START ON 11/27/2013] LORazepam  0-4 mg Intravenous Q12H  . methylPREDNISolone (SOLU-MEDROL) injection  60 mg Intravenous BID  . multivitamin with minerals  1 tablet Oral Daily  . nicotine  14 mg Transdermal Daily  . pantoprazole (PROTONIX) IV  40 mg  Intravenous Q12H  . [START ON 11/27/2013] peg 3350 powder  1 kit Oral Once  . thiamine  100 mg Oral Daily   Or  . thiamine  100 mg Intravenous Daily   Continuous Infusions:   Active Problems:   Crohn disease   Microcytic hypochromic anemia   Exacerbation of Crohn's disease   Alcohol abuse   Tobacco abuse    Darcella Gasman, PA-S  [facility] Pager [phone]. If 7PM-7AM, please contact night-coverage at [email or web address], password [facility] 11/26/2013, 10:41 AM  LOS: 1 day   Attending Patient was seen, examined,treatment plan was discussed with the Physician extender. I have directly reviewed the clinical findings, lab, imaging studies and management of this patient in detail. I have made the necessary changes to the above noted documentation, and agree with the documentation, as recorded by the Physician extender.  Nena Alexander MD [facility].

## 2013-11-26 NOTE — Progress Notes (Signed)
[facility] Progress Note  Subjective:   Still with abd pain. Feels pain meds not adequate. Tol soft  diet. No diarrhea. No nausea or vomiting.     Objective:  Vital signs in last 24 hours: Temp:  [98.2 F (36.8 C)-99.2 F (37.3 C)] 98.7 F (37.1 C) (09/11 0547) Pulse Rate:  [65-100] 65 (09/11 0547) Resp:  [16-18] 18 (09/11 0000) BP: (144-152)/(64-92) 152/92 mmHg (09/11 0547) SpO2:  [98 %-100 %] 100 % (09/11 0547) Last BM Date: 11/25/13 General:   Alert,  Well-developed,    in NAD Heart:  Regular rate and rhythm; no murmurs Pulm; Abdomen:  Soft, nontender and nondistended. Normal bowel sounds, without guarding, and without rebound.   Extremities:  Without edema. Neurologic:  Alert and  oriented x4;  grossly normal neurologically. Psych:  Alert and cooperative. Normal mood and affect.  Intake/Output from previous day: 09/10 0701 - 09/11 0700 In: 1337.1 [P.O.:825; I.V.:303.8; Blood:208.3] Out: 400 [Urine:400] Intake/Output this shift: Total I/O In: 220 [P.O.:220] Out: -   Lab Results:  Recent Labs  11/25/13 0118 11/25/13 1130 11/26/13 0735  WBC 8.4 11.6* 9.8  HGB 7.0* 8.9* 10.6*  HCT 25.6* 30.9* 35.5*  PLT 731* 730* 669*   BMET  Recent Labs  11/25/13 0118 11/25/13 1130 11/26/13 0735  NA 137 136* 138  K 4.2 4.5 4.2  CL 97 94* 95*  CO2 23 18* 27  GLUCOSE 86 205* 103*  BUN 7 7 8   CREATININE 0.65 0.58 0.67  CALCIUM 8.7 9.1 9.7   LFT  Recent Labs  11/25/13 1130  PROT 8.0  ALBUMIN 3.7  AST 71*  ALT 67*  ALKPHOS 88  BILITOT 0.5  Hepatitis Panel drawn 08/03/2013:  Hepatitis B Surface Ag NEGATIVE  NEGATIVE    HCV Ab NEGATIVE  NEGATIVE    Hep A IgM NON REACTIVE  NON REACTIVE    Hep B C IgM NON REACTIVE  NON REACTIVE   C difficile by pcr NEGATIVE 9/102015  NEGATIVE    CRP on 11/25/13 <0.5 Ct Abdomen Pelvis W Contrast  11/25/2013   CLINICAL DATA:  Abdominal pain. Nausea, vomiting, and diarrhea since Wednesday. Rectal bleeding.  History of Crohn's.  EXAM: CT ABDOMEN AND PELVIS WITH CONTRAST  TECHNIQUE: Multidetector CT imaging of the abdomen and pelvis was performed using the standard protocol following bolus administration of intravenous contrast.  CONTRAST:  167mL OMNIPAQUE IOHEXOL 300 MG/ML  SOLN  COMPARISON:  10/21/2013  FINDINGS: Mild dependent atelectasis in the lung bases.  Diffuse fatty infiltration of the liver. Gallbladder, spleen, pancreas, adrenal glands, abdominal aorta, inferior vena cava, and retroperitoneal lymph nodes are unremarkable. Small accessory spleen. Stomach is unremarkable. Proximal small bowel are well filled with contrast material in demonstrate no evidence of distention or significant wall thickening. Distal small bowel loops are decompressed and therefore difficult to evaluate. Postoperative changes with anterior abdominal wall mass consistent with hernia repair. 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 No free air or free fluid in the abdomen. Tiny punctate stone demonstrated in the lower pole of the right kidney. No evidence of hydronephrosis in either kidney.  Pelvis: Rectal wall thickening and edema at, likely inflammatory. No free or loculated pelvic fluid collections. Bladder wall is not thickened. No pelvic mass or lymphadenopathy. No free or loculated pelvic fluid collections. Mild prominence of lymph nodes in the groin regions without pathologic enlargement, likely reactive. Lumbar scoliosis convex towards the left. No destructive bone lesions. Scattered areas of benign-appearing sclerosis  in the pelvis.  IMPRESSION: Postoperative subtotal colectomy with ileal sigmoid anastomosis. Wall thickening in the rectum, likely inflammatory. No abscess. Visualized small bowel is unremarkable although distal small bowel are decompressed and difficult to evaluate for inflammatory changes. Fatty infiltration of the liver. Probable punctate nonobstructing stone in the right kidney.    Electronically Signed   By: Lucienne Capers M.D.   On: 11/25/2013 22:33   Dg Abd Acute W/chest  11/25/2013   CLINICAL DATA:  Crohn's disease, lower abdominal pain and nausea, multiple prior abdominal surgeries.  EXAM: ACUTE ABDOMEN SERIES (ABDOMEN 2 VIEW & CHEST 1 VIEW)  COMPARISON:  CT of the abdomen and pelvis October 21, 2013  FINDINGS: Cardiomediastinal silhouette is unremarkable. Lungs are clear, no pleural effusions. No pneumothorax. Soft tissue planes and included osseous structures are unremarkable.  Bowel gas pattern is nondilated and nonobstructive. A few scattered bowel air-fluid levels without distension. No intra-abdominal mass effect, pathologic calcifications or free air. Soft tissue planes and included osseous structures are nonsuspicious. Lumbar levoscoliosis.  IMPRESSION: No acute cardiopulmonary process.  Nonspecific bowel gas pattern.   Electronically Signed   By: Elon Alas   On: 11/25/2013 03:25    Assessment / Plan: 33 yo male with Crohns flare. Ct without acute findings. CRP <0.5. Will likely need colonoscopy to eval for mucosal activity.H/H improved post transfusion. Will start on po iron as well.     LOS: 1 day   Hvozdovic, Lori P PA-C 11/26/2013,  GI ATTENDING  Patient continues to complain of pain. CT scan is quite reassuring with out acute abnormalities. Plan colonoscopy tomorrow to evaluate residual large bowel and distal small bowel. Appropriate therapeutic options can be considered thereafter.  Docia Chuck. Geri Seminole., M.D. [facility] Division of Gastroenterology

## 2013-11-27 ENCOUNTER — Encounter (HOSPITAL_COMMUNITY)
Admission: EM | Disposition: A | Discharge status: Home / Self Care | Payer: Self-pay | Source: Home / Self Care | Attending: Internal Medicine

## 2013-11-27 ENCOUNTER — Encounter (HOSPITAL_COMMUNITY): Payer: Self-pay

## 2013-11-27 HISTORY — PX: FLEXIBLE SIGMOIDOSCOPY: SHX5431

## 2013-11-27 LAB — CBC
HCT: 36.5 % — ABNORMAL LOW (ref 39.0–52.0)
Hemoglobin: 10.5 g/dL — ABNORMAL LOW (ref 13.0–17.0)
MCH: 21.6 pg — ABNORMAL LOW (ref 26.0–34.0)
MCHC: 28.8 g/dL — AB (ref 30.0–36.0)
MCV: 75.3 fL — ABNORMAL LOW (ref 78.0–100.0)
PLATELETS: 567 10*3/uL — AB (ref 150–400)
RBC: 4.85 MIL/uL (ref 4.22–5.81)
RDW: 21.8 % — ABNORMAL HIGH (ref 11.5–15.5)
WBC: 11.9 10*3/uL — ABNORMAL HIGH (ref 4.0–10.5)

## 2013-11-27 LAB — COMPREHENSIVE METABOLIC PANEL
ALT: 69 U/L — AB (ref 0–53)
AST: 62 U/L — AB (ref 0–37)
Albumin: 3.8 g/dL (ref 3.5–5.2)
Alkaline Phosphatase: 87 U/L (ref 39–117)
Anion gap: 15 (ref 5–15)
BUN: 9 mg/dL (ref 6–23)
CO2: 25 mEq/L (ref 19–32)
Calcium: 10 mg/dL (ref 8.4–10.5)
Chloride: 98 mEq/L (ref 96–112)
Creatinine, Ser: 0.7 mg/dL (ref 0.50–1.35)
GFR calc non Af Amer: >90 mL/min (ref >90)
Glucose, Bld: 92 mg/dL (ref 70–99)
Potassium: 4.4 mEq/L (ref 3.7–5.3)
SODIUM: 138 meq/L (ref 137–147)
TOTAL PROTEIN: 7.7 g/dL (ref 6.0–8.3)
Total Bilirubin: 0.4 mg/dL (ref 0.3–1.2)

## 2013-11-27 LAB — PROTIME-INR
INR: 0.95 (ref 0.00–1.49)
Prothrombin Time: 12.7 seconds (ref 11.6–15.2)

## 2013-11-27 SURGERY — COLONOSCOPY
Anesthesia: Moderate Sedation

## 2013-11-27 SURGERY — SIGMOIDOSCOPY, FLEXIBLE
Anesthesia: Moderate Sedation

## 2013-11-27 MED ORDER — FENTANYL CITRATE 0.05 MG/ML IJ SOLN
INTRAMUSCULAR | Status: AC
  Filled 2013-11-27: qty 4

## 2013-11-27 MED ORDER — MIDAZOLAM HCL 5 MG/5ML IJ SOLN
INTRAMUSCULAR | Status: DC | PRN
  Administered 2013-11-27 (×4): 2 mg via INTRAVENOUS

## 2013-11-27 MED ORDER — SODIUM CHLORIDE 0.9 % IV SOLN: INTRAVENOUS | Status: DC

## 2013-11-27 MED ORDER — DIPHENHYDRAMINE HCL 50 MG/ML IJ SOLN
INTRAMUSCULAR | Status: DC | PRN
  Administered 2013-11-27: 25 mg via INTRAVENOUS

## 2013-11-27 MED ORDER — FENTANYL CITRATE 0.05 MG/ML IJ SOLN
INTRAMUSCULAR | Status: DC | PRN
  Administered 2013-11-27 (×3): 25 ug via INTRAVENOUS

## 2013-11-27 MED ORDER — HYDROMORPHONE HCL PF 1 MG/ML IJ SOLN
1.0000 mg | INTRAMUSCULAR | Status: DC | PRN
  Administered 2013-11-27 (×2): 1 mg via INTRAVENOUS
  Filled 2013-11-27 (×3): qty 1

## 2013-11-27 MED ORDER — DIPHENHYDRAMINE HCL 50 MG/ML IJ SOLN
INTRAMUSCULAR | Status: AC
  Filled 2013-11-27: qty 1

## 2013-11-27 MED ORDER — MIDAZOLAM HCL 5 MG/ML IJ SOLN
INTRAMUSCULAR | Status: AC
  Filled 2013-11-27: qty 3

## 2013-11-27 NOTE — Interval H&P Note (Signed)
History and Physical Interval Note:  11/27/2013 9:08 AM  Benjamin Shelton  has presented today for surgery, with the diagnosis of crohns falre  The various methods of treatment have been discussed with the patient and family. After consideration of risks, benefits and other options for treatment, the patient has consented to  Procedure(s): COLONOSCOPY (N/A) as a surgical intervention .  The patient's history has been reviewed, patient examined, no change in status, stable for surgery.  I have reviewed the patient's chart and labs.  Questions were answered to the patient's satisfaction.     Dicy Smigel D

## 2013-11-27 NOTE — Op Note (Signed)
[facility] [place], [id number]   FLEXIBLE SIGMOIDOSCOPY PROCEDURE REPORT  PATIENT: Benjamin Shelton, Benjamin Shelton  MR#: [id number] BIRTHDATE: 22-Apr-1980 , 32  yrs. old GENDER: Male ENDOSCOPIST: Carol Ada, MD REFERRED BY: PROCEDURE DATE:  11/27/2013 PROCEDURE:   Sigmoidoscopy with biopsy ASA CLASS:   Class III INDICATIONS:Crohn's disease MEDICATIONS: Versed 8 mg IV, Fentanyl 75 mcg IV, and Benadryl 25 mg IV  DESCRIPTION OF PROCEDURE:   After the risks benefits and alternatives of the procedure were thoroughly explained, informed consent was obtained.  was not performed. The     endoscope was introduced through the anus  and advanced to the surgical anastomosis , limited by No adverse events experienced.   The quality of the prep was good .  The instrument was then slowly withdrawn as the mucosa was fully examined.         FINDINGS: The patient has a colo-colonic anastamosis.  The most proximal portion of this colon, i.e., the cecum was preserved with an intact ICV, was anastamosed to the rectum.  No evidence of inflammation in the TI. Just above the dentate line there was evidence of a rectal ulcer, but no other overt inflammation. Biopsies of the ulcer and the surrounding mucosa were obtained.  No other abnormalities were identified.  From the anus to the cecum the length was 15 cm.   Retroflexed views as previously described. The scope was then withdrawn from the patient and the procedure terminated.  COMPLICATIONS: There were no complications.  ENDOSCOPIC IMPRESSION: 1) Rectal ulcer. 2) Colo-colonic anastamosis.   RECOMMENDATIONS: 1) Await biopsy results.  REPEAT EXAM:   _______________________________ eSignedCarol Ada, MD 11/27/2013 9:55 AM   CC:

## 2013-11-27 NOTE — H&P (View-Only) (Signed)
[facility] Progress Note  Subjective:   Still with abd pain. Feels pain meds not adequate. Tol soft  diet. No diarrhea. No nausea or vomiting.     Objective:  Vital signs in last 24 hours: Temp:  [98.2 F (36.8 C)-99.2 F (37.3 C)] 98.7 F (37.1 C) (09/11 0547) Pulse Rate:  [65-100] 65 (09/11 0547) Resp:  [16-18] 18 (09/11 0000) BP: (144-152)/(64-92) 152/92 mmHg (09/11 0547) SpO2:  [98 %-100 %] 100 % (09/11 0547) Last BM Date: 11/25/13 General:   Alert,  Well-developed,    in NAD Heart:  Regular rate and rhythm; no murmurs Pulm; Abdomen:  Soft, nontender and nondistended. Normal bowel sounds, without guarding, and without rebound.   Extremities:  Without edema. Neurologic:  Alert and  oriented x4;  grossly normal neurologically. Psych:  Alert and cooperative. Normal mood and affect.  Intake/Output from previous day: 09/10 0701 - 09/11 0700 In: 1337.1 [P.O.:825; I.V.:303.8; Blood:208.3] Out: 400 [Urine:400] Intake/Output this shift: Total I/O In: 220 [P.O.:220] Out: -   Lab Results:  Recent Labs  11/25/13 0118 11/25/13 1130 11/26/13 0735  WBC 8.4 11.6* 9.8  HGB 7.0* 8.9* 10.6*  HCT 25.6* 30.9* 35.5*  PLT 731* 730* 669*   BMET  Recent Labs  11/25/13 0118 11/25/13 1130 11/26/13 0735  NA 137 136* 138  K 4.2 4.5 4.2  CL 97 94* 95*  CO2 23 18* 27  GLUCOSE 86 205* 103*  BUN 7 7 8   CREATININE 0.65 0.58 0.67  CALCIUM 8.7 9.1 9.7   LFT  Recent Labs  11/25/13 1130  PROT 8.0  ALBUMIN 3.7  AST 71*  ALT 67*  ALKPHOS 88  BILITOT 0.5  Hepatitis Panel drawn 08/03/2013:  Hepatitis B Surface Ag NEGATIVE  NEGATIVE    HCV Ab NEGATIVE  NEGATIVE    Hep A IgM NON REACTIVE  NON REACTIVE    Hep B C IgM NON REACTIVE  NON REACTIVE   C difficile by pcr NEGATIVE 9/102015  NEGATIVE    CRP on 11/25/13 <0.5 Ct Abdomen Pelvis W Contrast  11/25/2013   CLINICAL DATA:  Abdominal pain. Nausea, vomiting, and diarrhea since Wednesday. Rectal bleeding.  History of Crohn's.  EXAM: CT ABDOMEN AND PELVIS WITH CONTRAST  TECHNIQUE: Multidetector CT imaging of the abdomen and pelvis was performed using the standard protocol following bolus administration of intravenous contrast.  CONTRAST:  1105mL OMNIPAQUE IOHEXOL 300 MG/ML  SOLN  COMPARISON:  10/21/2013  FINDINGS: Mild dependent atelectasis in the lung bases.  Diffuse fatty infiltration of the liver. Gallbladder, spleen, pancreas, adrenal glands, abdominal aorta, inferior vena cava, and retroperitoneal lymph nodes are unremarkable. Small accessory spleen. Stomach is unremarkable. Proximal small bowel are well filled with contrast material in demonstrate no evidence of distention or significant wall thickening. Distal small bowel loops are decompressed and therefore difficult to evaluate. Postoperative changes with anterior abdominal wall mass consistent with hernia repair. 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 No free air or free fluid in the abdomen. Tiny punctate stone demonstrated in the lower pole of the right kidney. No evidence of hydronephrosis in either kidney.  Pelvis: Rectal wall thickening and edema at, likely inflammatory. No free or loculated pelvic fluid collections. Bladder wall is not thickened. No pelvic mass or lymphadenopathy. No free or loculated pelvic fluid collections. Mild prominence of lymph nodes in the groin regions without pathologic enlargement, likely reactive. Lumbar scoliosis convex towards the left. No destructive bone lesions. Scattered areas of benign-appearing sclerosis  in the pelvis.  IMPRESSION: Postoperative subtotal colectomy with ileal sigmoid anastomosis. Wall thickening in the rectum, likely inflammatory. No abscess. Visualized small bowel is unremarkable although distal small bowel are decompressed and difficult to evaluate for inflammatory changes. Fatty infiltration of the liver. Probable punctate nonobstructing stone in the right kidney.    Electronically Signed   By: Lucienne Capers M.D.   On: 11/25/2013 22:33   Dg Abd Acute W/chest  11/25/2013   CLINICAL DATA:  Crohn's disease, lower abdominal pain and nausea, multiple prior abdominal surgeries.  EXAM: ACUTE ABDOMEN SERIES (ABDOMEN 2 VIEW & CHEST 1 VIEW)  COMPARISON:  CT of the abdomen and pelvis October 21, 2013  FINDINGS: Cardiomediastinal silhouette is unremarkable. Lungs are clear, no pleural effusions. No pneumothorax. Soft tissue planes and included osseous structures are unremarkable.  Bowel gas pattern is nondilated and nonobstructive. A few scattered bowel air-fluid levels without distension. No intra-abdominal mass effect, pathologic calcifications or free air. Soft tissue planes and included osseous structures are nonsuspicious. Lumbar levoscoliosis.  IMPRESSION: No acute cardiopulmonary process.  Nonspecific bowel gas pattern.   Electronically Signed   By: Elon Alas   On: 11/25/2013 03:25    Assessment / Plan: 33 yo male with Crohns flare. Ct without acute findings. CRP <0.5. Will likely need colonoscopy to eval for mucosal activity.H/H improved post transfusion. Will start on po iron as well.     LOS: 1 day   Hvozdovic, Lori P PA-C 11/26/2013,  GI ATTENDING  Patient continues to complain of pain. CT scan is quite reassuring with out acute abnormalities. Plan colonoscopy tomorrow to evaluate residual large bowel and distal small bowel. Appropriate therapeutic options can be considered thereafter.  Docia Chuck. Geri Seminole., M.D. [facility] Division of Gastroenterology

## 2013-11-27 NOTE — Progress Notes (Signed)
PATIENT DETAILS Name: Benjamin Shelton Age: 33 y.o. Sex: male Date of Birth: April 03, 1980 Admit Date: 11/25/2013 Admitting Physician Rise Patience, MD PCP:No PCP Per Patient  Subjective: Abdominal pain easing off  Assessment/Plan: Principal Problem:   Exacerbation of Crohn's disease -Admitted and started on IV Solumedrol, Imuran resumed (ran out of Imuran as outpatient then symp started) -Initially kept NPO-diet slowly advanced-now diet advanced to regular -GI consulted-underwent Colonoscopy-rectal ulcer seen. Await Bx results  Active Problems: Acute Blood Loss Anemia  -secondary to bleeding from Crohn's-however no overt bleeding evident  -Received 2 units of PRBCs-Hb stable post transfusion  Thrombocytosis  -Suspect due to crohn's -likely reactive.   Tobacco abuse  -Patient was advised to quit smoking  - On nicotine patch.   Disposition: Remain inpatient  DVT Prophylaxis:  SCD's  Code Status: DNR  Family Communication None at bedside  Procedures:  None  CONSULTS:  GI  Time spent 40 minutes-which includes 50% of the time with face-to-face with patient/ family and coordinating care related to the above assessment and plan.    MEDICATIONS: Scheduled Meds: . azaTHIOprine  50 mg Oral Daily  . folic acid  1 mg Oral Daily  . LORazepam  0-4 mg Intravenous Q12H  . methylPREDNISolone (SOLU-MEDROL) injection  60 mg Intravenous BID  . multivitamin with minerals  1 tablet Oral Daily  . nicotine  14 mg Transdermal Daily  . pantoprazole (PROTONIX) IV  40 mg Intravenous Q12H  . thiamine  100 mg Oral Daily   Or  . thiamine  100 mg Intravenous Daily   Continuous Infusions:  PRN Meds:.acetaminophen, acetaminophen, bisacodyl, bisacodyl, HYDROmorphone (DILAUDID) injection, LORazepam, LORazepam, ondansetron (ZOFRAN) IV, ondansetron, oxyCODONE  Antibiotics: Anti-infectives   None       PHYSICAL EXAM: Vital signs in last 24 hours: Filed Vitals:   11/27/13 0955 11/27/13 1000 11/27/13 1013 11/27/13 1405  BP: 120/71 160/89 149/84 124/70  Pulse: 71 68 76 80  Temp:   98.1 F (36.7 C) 98.9 F (37.2 C)  TempSrc:   Oral Oral  Resp: 15 14 16 16   Height:      Weight:      SpO2: 97% 99% 98% 99%    Weight change:  Filed Weights   11/25/13 0105 11/25/13 0750  Weight: 56.7 kg (125 lb) 53.706 kg (118 lb 6.4 oz)   Body mass index is 20.98 kg/(m^2).   Gen Exam: Awake and alert with clear speech.   Neck: Supple, No JVD.   Chest: B/L Clear.   CVS: S1 S2 Regular, no murmurs.  Abdomen: soft, BS +, mildly tender in the right mid area of abdomen, non distended.  Extremities: no edema, lower extremities warm to touch. Neurologic: Non Focal.   Skin: No Rash.   Wounds: N/A.   Intake/Output from previous day:  Intake/Output Summary (Last 24 hours) at 11/27/13 1438 Last data filed at 11/27/13 1014  Gross per 24 hour  Intake   1058 ml  Output      0 ml  Net   1058 ml     LAB RESULTS: CBC  Recent Labs Lab 11/25/13 0118 11/25/13 1130 11/26/13 0735 11/27/13 0755  WBC 8.4 11.6* 9.8 11.9*  HGB 7.0* 8.9* 10.6* 10.5*  HCT 25.6* 30.9* 35.5* 36.5*  PLT 731* 730* 669* 567*  MCV 67.9* 73.0* 72.6* 75.3*  MCH 18.6* 21.0* 21.7* 21.6*  MCHC 27.3* 28.8* 29.9* 28.8*  RDW 20.5* 21.4* 20.6* 21.8*  LYMPHSABS 1.8 0.5*  --   --  MONOABS 1.3* 0.1  --   --   EOSABS 0.2 0.0  --   --   BASOSABS 0.2* 0.1  --   --     Chemistries   Recent Labs Lab 11/25/13 0118 11/25/13 1130 11/26/13 0735 11/27/13 0755  NA 137 136* 138 138  K 4.2 4.5 4.2 4.4  CL 97 94* 95* 98  CO2 23 18* 27 25  GLUCOSE 86 205* 103* 92  BUN 7 7 8 9   CREATININE 0.65 0.58 0.67 0.70  CALCIUM 8.7 9.1 9.7 10.0    CBG: No results found for this basename: GLUCAP,  in the last 168 hours  GFR Estimated Creatinine Clearance: 98.8 ml/min (by C-G formula based on Cr of 0.7).  Coagulation profile  Recent Labs Lab 11/27/13 1135  INR 0.95    Cardiac Enzymes No  results found for this basename: CK, CKMB, TROPONINI, MYOGLOBIN,  in the last 168 hours  No components found with this basename: POCBNP,  No results found for this basename: DDIMER,  in the last 72 hours No results found for this basename: HGBA1C,  in the last 72 hours No results found for this basename: CHOL, HDL, LDLCALC, TRIG, CHOLHDL, LDLDIRECT,  in the last 72 hours No results found for this basename: TSH, T4TOTAL, FREET3, T3FREE, THYROIDAB,  in the last 72 hours No results found for this basename: VITAMINB12, FOLATE, FERRITIN, TIBC, IRON, RETICCTPCT,  in the last 72 hours  Recent Labs  11/25/13 1845  LIPASE 25  AMYLASE 251*    Urine Studies No results found for this basename: UACOL, UAPR, USPG, UPH, UTP, UGL, UKET, UBIL, UHGB, UNIT, UROB, ULEU, UEPI, UWBC, URBC, UBAC, CAST, CRYS, UCOM, BILUA,  in the last 72 hours  MICROBIOLOGY: Recent Results (from the past 240 hour(s))  STOOL CULTURE     Status: None   Collection Time    11/25/13 10:37 AM      Result Value Ref Range Status   Specimen Description STOOL   Final   Special Requests NONE   Final   Culture     Final   Value: NO SUSPICIOUS COLONIES, CONTINUING TO HOLD     Performed at [facility]   Report Status PENDING   Incomplete  CLOSTRIDIUM DIFFICILE BY PCR     Status: None   Collection Time    11/25/13 10:37 AM      Result Value Ref Range Status   C difficile by pcr NEGATIVE  NEGATIVE Final    RADIOLOGY STUDIES/RESULTS: Ct Abdomen Pelvis W Contrast  11/25/2013   CLINICAL DATA:  Abdominal pain. Nausea, vomiting, and diarrhea since Wednesday. Rectal bleeding. History of Crohn's.  EXAM: CT ABDOMEN AND PELVIS WITH CONTRAST  TECHNIQUE: Multidetector CT imaging of the abdomen and pelvis was performed using the standard protocol following bolus administration of intravenous contrast.  CONTRAST:  111mL OMNIPAQUE IOHEXOL 300 MG/ML  SOLN  COMPARISON:  10/21/2013  FINDINGS: Mild dependent atelectasis in the lung bases.   Diffuse fatty infiltration of the liver. Gallbladder, spleen, pancreas, adrenal glands, abdominal aorta, inferior vena cava, and retroperitoneal lymph nodes are unremarkable. Small accessory spleen. Stomach is unremarkable. Proximal small bowel are well filled with contrast material in demonstrate no evidence of distention or significant wall thickening. Distal small bowel loops are decompressed and therefore difficult to evaluate. Postoperative changes with anterior abdominal wall mass consistent with hernia repair. 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 No free air or free fluid in the abdomen. Tiny punctate stone  demonstrated in the lower pole of the right kidney. No evidence of hydronephrosis in either kidney.  Pelvis: Rectal wall thickening and edema at, likely inflammatory. No free or loculated pelvic fluid collections. Bladder wall is not thickened. No pelvic mass or lymphadenopathy. No free or loculated pelvic fluid collections. Mild prominence of lymph nodes in the groin regions without pathologic enlargement, likely reactive. Lumbar scoliosis convex towards the left. No destructive bone lesions. Scattered areas of benign-appearing sclerosis in the pelvis.  IMPRESSION: Postoperative subtotal colectomy with ileal sigmoid anastomosis. Wall thickening in the rectum, likely inflammatory. No abscess. Visualized small bowel is unremarkable although distal small bowel are decompressed and difficult to evaluate for inflammatory changes. Fatty infiltration of the liver. Probable punctate nonobstructing stone in the right kidney.   Electronically Signed   By: Lucienne Capers M.D.   On: 11/25/2013 22:33   Dg Abd Acute W/chest  11/25/2013   CLINICAL DATA:  Crohn's disease, lower abdominal pain and nausea, multiple prior abdominal surgeries.  EXAM: ACUTE ABDOMEN SERIES (ABDOMEN 2 VIEW & CHEST 1 VIEW)  COMPARISON:  CT of the abdomen and pelvis October 21, 2013  FINDINGS:  Cardiomediastinal silhouette is unremarkable. Lungs are clear, no pleural effusions. No pneumothorax. Soft tissue planes and included osseous structures are unremarkable.  Bowel gas pattern is nondilated and nonobstructive. A few scattered bowel air-fluid levels without distension. No intra-abdominal mass effect, pathologic calcifications or free air. Soft tissue planes and included osseous structures are nonsuspicious. Lumbar levoscoliosis.  IMPRESSION: No acute cardiopulmonary process.  Nonspecific bowel gas pattern.   Electronically Signed   By: Elon Alas   On: 11/25/2013 03:25    Oren Binet, MD  [facility] Pager:[phone]  If 7PM-7AM, please contact night-coverage [email or web address] Password [email or web address] 11/27/2013, 2:38 PM   LOS: 2 days   **Disclaimer: This note may have been dictated with voice recognition software. Similar sounding words can inadvertently be transcribed and this note may contain transcription errors which may not have been corrected upon publication of note.**

## 2013-11-28 DIAGNOSIS — D649 Anemia, unspecified: Secondary | ICD-10-CM

## 2013-11-28 MED ORDER — PANTOPRAZOLE SODIUM 40 MG PO TBEC
40.0000 mg | DELAYED_RELEASE_TABLET | Freq: Two times a day (BID) | ORAL | Status: DC
  Administered 2013-11-28 – 2013-11-29 (×2): 40 mg via ORAL
  Filled 2013-11-28 (×2): qty 1

## 2013-11-28 NOTE — Progress Notes (Addendum)
Subjective: Feeling better, but still in pain.  No further diarrhea.  Objective: Vital signs in last 24 hours: Temp:  [98.1 F (36.7 C)-98.9 F (37.2 C)] 98.4 F (36.9 C) (09/13 0543) Pulse Rate:  [65-132] 65 (09/13 0543) Resp:  [0-22] 17 (09/13 0543) BP: (120-172)/(70-110) 132/77 mmHg (09/13 0543) SpO2:  [96 %-100 %] 100 % (09/13 0543) Last BM Date: 11/27/13  Intake/Output from previous day: 09/12 0701 - 09/13 0700 In: 720 [P.O.:720] Out: 500 [Urine:500] Intake/Output this shift: Total I/O In: 100 [P.O.:100] Out: -   General appearance: alert and no distress GI: soft, non-tender; bowel sounds normal; no masses,  no organomegaly  Lab Results:  Recent Labs  11/25/13 1130 11/26/13 0735 11/27/13 0755  WBC 11.6* 9.8 11.9*  HGB 8.9* 10.6* 10.5*  HCT 30.9* 35.5* 36.5*  PLT 730* 669* 567*   BMET  Recent Labs  11/25/13 1130 11/26/13 0735 11/27/13 0755  NA 136* 138 138  K 4.5 4.2 4.4  CL 94* 95* 98  CO2 18* 27 25  GLUCOSE 205* 103* 92  BUN 7 8 9   CREATININE 0.58 0.67 0.70  CALCIUM 9.1 9.7 10.0   LFT  Recent Labs  11/27/13 0755  PROT 7.7  ALBUMIN 3.8  AST 62*  ALT 69*  ALKPHOS 87  BILITOT 0.4   PT/INR  Recent Labs  11/27/13 1135  LABPROT 12.7  INR 0.95   Hepatitis Panel No results found for this basename: HEPBSAG, HCVAB, HEPAIGM, HEPBIGM,  in the last 72 hours C-Diff No results found for this basename: CDIFFTOX,  in the last 72 hours Fecal Lactopherrin No results found for this basename: FECLLACTOFRN,  in the last 72 hours  Studies/Results: No results found.  Medications:  Scheduled: . azaTHIOprine  50 mg Oral Daily  . folic acid  1 mg Oral Daily  . LORazepam  0-4 mg Intravenous Q12H  . methylPREDNISolone (SOLU-MEDROL) injection  60 mg Intravenous BID  . multivitamin with minerals  1 tablet Oral Daily  . nicotine  14 mg Transdermal Daily  . pantoprazole (PROTONIX) IV  40 mg Intravenous Q12H  . thiamine  100 mg Oral Daily   Or  .  thiamine  100 mg Intravenous Daily   Continuous:   Assessment/Plan: 1) Crohn's disease. 2) Rectal ulceration.   Clinically he appears well, but he is still having issues with pain control.  The presumption is that he has Crohn's disease.  The colorectal anastamosis is intact, but there is a rectal ulceration just above the dentate line.  He may benefit from Canasa suppositories in addition to his suppositories and steroids.  Plan: 1) Continue with AZA and steroids. 2) Trial of Canasa.   ADDENDUM:  The patient has an allergy to NSAIDS and an alert came up with starting Sinclair.  This medication will not be ordered.    LOS: 3 days   Colson Barco D 11/28/2013, 8:40 AM

## 2013-11-28 NOTE — Progress Notes (Signed)
PATIENT DETAILS Name: Benjamin Shelton Age: 33 y.o. Sex: male Date of Birth: 1980/10/09 Admit Date: 11/25/2013 Admitting Physician Rise Patience, MD PCP:No PCP Per Patient  Subjective: Abdominal pain easing off, improving-no diarrhea. Ambulating in hallway  Assessment/Plan: Principal Problem:   Exacerbation of Crohn's disease -Admitted and started on IV Solumedrol, Imuran resumed (ran out of Imuran as outpatient then symp started) -Initially kept NPO-diet slowly advanced-now diet advanced to regular -GI consulted-underwent Colonoscopy-rectal ulcer seen. Await Bx results  Active Problems: Acute Blood Loss Anemia  -secondary to bleeding from Crohn's-however no overt bleeding evident  -Received 2 units of PRBCs-Hb stable post transfusion  Thrombocytosis  -Suspect due to crohn's -likely reactive.   Tobacco abuse  -Patient was advised to quit smoking  - On nicotine patch.   Disposition: Remain inpatient-suspect home 9/14  DVT Prophylaxis:  SCD's  Code Status: DNR  Family Communication None at bedside  Procedures:  None  CONSULTS:  GI  Time spent 40 minutes-which includes 50% of the time with face-to-face with patient/ family and coordinating care related to the above assessment and plan.    MEDICATIONS: Scheduled Meds: . azaTHIOprine  50 mg Oral Daily  . folic acid  1 mg Oral Daily  . LORazepam  0-4 mg Intravenous Q12H  . methylPREDNISolone (SOLU-MEDROL) injection  60 mg Intravenous BID  . multivitamin with minerals  1 tablet Oral Daily  . nicotine  14 mg Transdermal Daily  . pantoprazole  40 mg Oral BID  . thiamine  100 mg Oral Daily   Continuous Infusions:  PRN Meds:.acetaminophen, acetaminophen, bisacodyl, bisacodyl, ondansetron (ZOFRAN) IV, ondansetron, oxyCODONE  Antibiotics: Anti-infectives   None       PHYSICAL EXAM: Vital signs in last 24 hours: Filed Vitals:   11/27/13 1405 11/27/13 2202 11/28/13 0000 11/28/13 0543    BP: 124/70 143/92 149/93 132/77  Pulse: 80 84 89 65  Temp: 98.9 F (37.2 C) 98.7 F (37.1 C)  98.4 F (36.9 C)  TempSrc: Oral Oral  Oral  Resp: 16 18  17   Height:      Weight:      SpO2: 99% 98%  100%    Weight change:  Filed Weights   11/25/13 0105 11/25/13 0750  Weight: 56.7 kg (125 lb) 53.706 kg (118 lb 6.4 oz)   Body mass index is 20.98 kg/(m^2).   Gen Exam: Awake and alert with clear speech.   Neck: Supple, No JVD.   Chest: B/L Clear.  No rales or rhonchi CVS: S1 S2 Regular, no murmurs.  Abdomen: soft, BS +, mildly tender in the right mid area of abdomen, non distended.  Extremities: no edema, lower extremities warm to touch. Neurologic: Non Focal.   Skin: No Rash.   Wounds: N/A.   Intake/Output from previous day:  Intake/Output Summary (Last 24 hours) at 11/28/13 1353 Last data filed at 11/28/13 1043  Gross per 24 hour  Intake    820 ml  Output    500 ml  Net    320 ml     LAB RESULTS: CBC  Recent Labs Lab 11/25/13 0118 11/25/13 1130 11/26/13 0735 11/27/13 0755  WBC 8.4 11.6* 9.8 11.9*  HGB 7.0* 8.9* 10.6* 10.5*  HCT 25.6* 30.9* 35.5* 36.5*  PLT 731* 730* 669* 567*  MCV 67.9* 73.0* 72.6* 75.3*  MCH 18.6* 21.0* 21.7* 21.6*  MCHC 27.3* 28.8* 29.9* 28.8*  RDW 20.5* 21.4* 20.6* 21.8*  LYMPHSABS 1.8 0.5*  --   --  MONOABS 1.3* 0.1  --   --   EOSABS 0.2 0.0  --   --   BASOSABS 0.2* 0.1  --   --     Chemistries   Recent Labs Lab 11/25/13 0118 11/25/13 1130 11/26/13 0735 11/27/13 0755  NA 137 136* 138 138  K 4.2 4.5 4.2 4.4  CL 97 94* 95* 98  CO2 23 18* 27 25  GLUCOSE 86 205* 103* 92  BUN 7 7 8 9   CREATININE 0.65 0.58 0.67 0.70  CALCIUM 8.7 9.1 9.7 10.0    CBG: No results found for this basename: GLUCAP,  in the last 168 hours  GFR Estimated Creatinine Clearance: 98.8 ml/min (by C-G formula based on Cr of 0.7).  Coagulation profile  Recent Labs Lab 11/27/13 1135  INR 0.95    Cardiac Enzymes No results found for this  basename: CK, CKMB, TROPONINI, MYOGLOBIN,  in the last 168 hours  No components found with this basename: POCBNP,  No results found for this basename: DDIMER,  in the last 72 hours No results found for this basename: HGBA1C,  in the last 72 hours No results found for this basename: CHOL, HDL, LDLCALC, TRIG, CHOLHDL, LDLDIRECT,  in the last 72 hours No results found for this basename: TSH, T4TOTAL, FREET3, T3FREE, THYROIDAB,  in the last 72 hours No results found for this basename: VITAMINB12, FOLATE, FERRITIN, TIBC, IRON, RETICCTPCT,  in the last 72 hours  Recent Labs  11/25/13 1845  LIPASE 25  AMYLASE 251*    Urine Studies No results found for this basename: UACOL, UAPR, USPG, UPH, UTP, UGL, UKET, UBIL, UHGB, UNIT, UROB, ULEU, UEPI, UWBC, URBC, UBAC, CAST, CRYS, UCOM, BILUA,  in the last 72 hours  MICROBIOLOGY: Recent Results (from the past 240 hour(s))  STOOL CULTURE     Status: None   Collection Time    11/25/13 10:37 AM      Result Value Ref Range Status   Specimen Description STOOL   Final   Special Requests NONE   Final   Culture     Final   Value: NO SUSPICIOUS COLONIES, CONTINUING TO HOLD     Performed at [facility]   Report Status PENDING   Incomplete  CLOSTRIDIUM DIFFICILE BY PCR     Status: None   Collection Time    11/25/13 10:37 AM      Result Value Ref Range Status   C difficile by pcr NEGATIVE  NEGATIVE Final    RADIOLOGY STUDIES/RESULTS: Ct Abdomen Pelvis W Contrast  11/25/2013   CLINICAL DATA:  Abdominal pain. Nausea, vomiting, and diarrhea since Wednesday. Rectal bleeding. History of Crohn's.  EXAM: CT ABDOMEN AND PELVIS WITH CONTRAST  TECHNIQUE: Multidetector CT imaging of the abdomen and pelvis was performed using the standard protocol following bolus administration of intravenous contrast.  CONTRAST:  123mL OMNIPAQUE IOHEXOL 300 MG/ML  SOLN  COMPARISON:  10/21/2013  FINDINGS: Mild dependent atelectasis in the lung bases.  Diffuse fatty  infiltration of the liver. Gallbladder, spleen, pancreas, adrenal glands, abdominal aorta, inferior vena cava, and retroperitoneal lymph nodes are unremarkable. Small accessory spleen. Stomach is unremarkable. Proximal small bowel are well filled with contrast material in demonstrate no evidence of distention or significant wall thickening. Distal small bowel loops are decompressed and therefore difficult to evaluate. Postoperative changes with anterior abdominal wall mass consistent with hernia repair. 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 No free air or free fluid in the abdomen. Tiny punctate stone  demonstrated in the lower pole of the right kidney. No evidence of hydronephrosis in either kidney.  Pelvis: Rectal wall thickening and edema at, likely inflammatory. No free or loculated pelvic fluid collections. Bladder wall is not thickened. No pelvic mass or lymphadenopathy. No free or loculated pelvic fluid collections. Mild prominence of lymph nodes in the groin regions without pathologic enlargement, likely reactive. Lumbar scoliosis convex towards the left. No destructive bone lesions. Scattered areas of benign-appearing sclerosis in the pelvis.  IMPRESSION: Postoperative subtotal colectomy with ileal sigmoid anastomosis. Wall thickening in the rectum, likely inflammatory. No abscess. Visualized small bowel is unremarkable although distal small bowel are decompressed and difficult to evaluate for inflammatory changes. Fatty infiltration of the liver. Probable punctate nonobstructing stone in the right kidney.   Electronically Signed   By: Lucienne Capers M.D.   On: 11/25/2013 22:33   Dg Abd Acute W/chest  11/25/2013   CLINICAL DATA:  Crohn's disease, lower abdominal pain and nausea, multiple prior abdominal surgeries.  EXAM: ACUTE ABDOMEN SERIES (ABDOMEN 2 VIEW & CHEST 1 VIEW)  COMPARISON:  CT of the abdomen and pelvis October 21, 2013  FINDINGS: Cardiomediastinal silhouette  is unremarkable. Lungs are clear, no pleural effusions. No pneumothorax. Soft tissue planes and included osseous structures are unremarkable.  Bowel gas pattern is nondilated and nonobstructive. A few scattered bowel air-fluid levels without distension. No intra-abdominal mass effect, pathologic calcifications or free air. Soft tissue planes and included osseous structures are nonsuspicious. Lumbar levoscoliosis.  IMPRESSION: No acute cardiopulmonary process.  Nonspecific bowel gas pattern.   Electronically Signed   By: Elon Alas   On: 11/25/2013 03:25    Oren Binet, MD  [facility] Pager:[phone]  If 7PM-7AM, please contact night-coverage [email or web address] Password [email or web address] 11/28/2013, 1:53 PM   LOS: 3 days   **Disclaimer: This note may have been dictated with voice recognition software. Similar sounding words can inadvertently be transcribed and this note may contain transcription errors which may not have been corrected upon publication of note.**

## 2013-11-29 ENCOUNTER — Encounter (HOSPITAL_COMMUNITY): Payer: Self-pay | Admitting: Gastroenterology

## 2013-11-29 DIAGNOSIS — E41 Nutritional marasmus: Secondary | ICD-10-CM

## 2013-11-29 DIAGNOSIS — F1010 Alcohol abuse, uncomplicated: Secondary | ICD-10-CM

## 2013-11-29 LAB — STOOL CULTURE

## 2013-11-29 MED ORDER — HYDROCORTISONE ACETATE 25 MG RE SUPP
25.0000 mg | Freq: Two times a day (BID) | RECTAL | Status: DC
  Filled 2013-11-29 (×3): qty 1

## 2013-11-29 MED ORDER — AZATHIOPRINE 50 MG PO TABS: 50.0000 mg | ORAL_TABLET | Freq: Every day | ORAL | Status: DC

## 2013-11-29 MED ORDER — DICYCLOMINE HCL 10 MG PO CAPS: 10.0000 mg | ORAL_CAPSULE | Freq: Four times a day (QID) | ORAL | Status: DC | PRN

## 2013-11-29 MED ORDER — HYDROCORTISONE ACETATE 25 MG RE SUPP
25.0000 mg | Freq: Two times a day (BID) | RECTAL | Status: DC
  Filled 2013-11-29 (×2): qty 1

## 2013-11-29 MED ORDER — PREDNISONE 50 MG PO TABS
50.0000 mg | ORAL_TABLET | Freq: Every day | ORAL | Status: DC
  Administered 2013-11-29: 50 mg via ORAL
  Filled 2013-11-29 (×2): qty 1

## 2013-11-29 MED ORDER — ADULT MULTIVITAMIN W/MINERALS CH: 1.0000 | ORAL_TABLET | Freq: Every day | ORAL | Status: DC

## 2013-11-29 MED ORDER — DICYCLOMINE HCL 10 MG PO CAPS
10.0000 mg | ORAL_CAPSULE | Freq: Four times a day (QID) | ORAL | Status: DC | PRN
  Administered 2013-11-29: 10 mg via ORAL
  Filled 2013-11-29: qty 1

## 2013-11-29 MED ORDER — OXYCODONE HCL 5 MG PO TABS
10.0000 mg | ORAL_TABLET | ORAL | Status: DC | PRN
Start: 2013-11-29 — End: 2014-04-15

## 2013-11-29 MED ORDER — PANTOPRAZOLE SODIUM 40 MG PO TBEC: 40.0000 mg | DELAYED_RELEASE_TABLET | Freq: Two times a day (BID) | ORAL | Status: DC

## 2013-11-29 MED ORDER — HYDROCORTISONE ACETATE 25 MG RE SUPP: 25.0000 mg | Freq: Two times a day (BID) | RECTAL | Status: DC

## 2013-11-29 MED ORDER — PREDNISONE 10 MG PO TABS: ORAL_TABLET | ORAL | Status: DC

## 2013-11-29 NOTE — Discharge Instructions (Signed)
Please follow up with both your Gastroenterologist and your Primary Care Appointments.  You were cared for by a hospitalist during your hospital stay. If you have any questions about your discharge medications or the care you received while you were in the hospital after you are discharged, you can call the unit and asked to speak with the hospitalist on call if the hospitalist that took care of you is not available. Once you are discharged, your primary care physician will handle any further medical issues. Please note that NO REFILLS for any discharge medications will be authorized once you are discharged, as it is imperative that you return to your primary care physician (or establish a relationship with a primary care physician if you do not have one) for your aftercare needs so that they can reassess your need for medications and monitor your lab values.

## 2013-11-29 NOTE — Progress Notes (Signed)
     [facility] Progress Note  Subjective:   Tolerating regular diet. Much less pain. Has some intermittent cramping. No diarrhea. Would like to go home.    Objective:  Vital signs in last 24 hours: Temp:  [98.4 F (36.9 C)-98.5 F (36.9 C)] 98.5 F (36.9 C) (09/14 0520) Pulse Rate:  [67-90] 67 (09/14 0520) Resp:  [16-17] 17 (09/14 0520) BP: (132-164)/(69-94) 132/69 mmHg (09/14 0520) SpO2:  [98 %-100 %] 98 % (09/14 0520) Last BM Date: 11/29/13 General:   Alert,  Well-developed, male  in NAD Heart:  Regular rate and rhythm; no murmurs Pulm lungs clear to ausc bilat Abdomen:  Soft, mild tenderness throughout but much improved, nondistended. Normal bowel sounds, without guarding, and without rebound.   Extremities:  Without edema. Neurologic:  Alert and  oriented x4;  grossly normal neurologically. Psych:  Alert and cooperative. Normal mood and affect.  Intake/Output from previous day: 09/13 0701 - 09/14 0700 In: 1060 [P.O.:1060] Out: 800 [Urine:800] Intake/Output this shift: Total I/O In: 240 [P.O.:240] Out: -   Lab Results:  Recent Labs  11/27/13 0755  WBC 11.9*  HGB 10.5*  HCT 36.5*  PLT 567*   BMET  Recent Labs  11/27/13 0755  NA 138  K 4.4  CL 98  CO2 25  GLUCOSE 92  BUN 9  CREATININE 0.70  CALCIUM 10.0  // LFT  Recent Labs  11/27/13 0755  PROT 7.7  ALBUMIN 3.8  AST 62*  ALT 69*  ALKPHOS 87  BILITOT 0.4   PT/INR  Recent Labs  11/27/13 1135  LABPROT 12.7  INR 0.95   PROCEDURES: flex sig on 9/12/15FINDINGS: The patient has a colo-colonic anastamosis. The most proximal portion of this colon, i.e., the cecum was preserved with an intact ICV, was anastamosed to the rectum. No evidence of inflammation in the TI. Just above the dentate line there was evidence of a rectal ulcer, but no other overt inflammation. Biopsies of the ulcer and the surrounding mucosa were obtained. No other abnormalities were identified. From the anus  to the cecum the length was 15 cm. Retroflexed views as previously described. The scope was then withdrawn from the patient and the procedure terminated. Assessment / Plan: 33 yo male with Crohns, appears to be doing much better clinically, but still has some issues with pain control. He would like to go home. Will give trial of bentyl prn cramping. Reviewed pt with Dr Carlean Purl. Pt can be d/c home on prednisone taper and current dose AZA.  Should also use anusol suppositories. Pt to follow up in GI office in 3-4 weeks.     LOS: 4 days   Hvozdovic, Vita Barley PA-C 11/29/2013,   Agree w/ plans for DC and f/u as per Ms. Hvozdovic. Gatha Mayer, MD, [facility] [phone] (pager) 11/29/2013 2:58 PM

## 2013-11-29 NOTE — Progress Notes (Signed)
Pt with d/c orders to home. Discharge instructions reviewed with patient. Teach back method used. All belongings returned to patient. PIV removed and intact. Pt verbalized understanding of all discharge teaching. Pt with no further questions upon discharge and stable.

## 2013-11-29 NOTE — Progress Notes (Signed)
Chaplain responded to spiritual care consult. Chaplain went over concept of advanced directive and left information. Will follow as needed.   11/29/13 1400  Clinical Encounter Type  Visited With Patient  Visit Type Initial  Referral From Nurse  Advance Directives (For Healthcare)  Does patient have an advance directive? No  Would patient like information on creating an advanced directive? Yes - Educational materials given  Rolly Salter, Chaplain 2:30 PM 11/29/2013

## 2013-11-29 NOTE — Discharge Summary (Signed)
Physician Discharge Summary  Benjamin Shelton KXF:[id number] DOB: June 15, 1980 DOA: 11/25/2013  PCP: No PCP Per Patient  Admit date: 11/25/2013 Discharge date: 11/29/2013  Time spent:  60 minutes  Recommendations for Outpatient Follow-up:  1. Check cbc and cmet at hospital follow up. 2. Follow up pathology from flex sig.  Discharge Diagnoses:  Principal Problem:   Exacerbation of Crohn's disease Active Problems:   Crohn disease   Microcytic hypochromic anemia   Alcohol abuse   Tobacco abuse   Severe malnutrition   Discharge Condition: stabe  Diet recommendation: low residue, soft diet.  Filed Weights   11/25/13 0105 11/25/13 0750  Weight: 56.7 kg (125 lb) 53.706 kg (118 lb 6.4 oz)    History of present illness at the time of admission:  Benjamin Shelton is a 33 y.o. male history of Crohn's disease who was recently admitted to the hospital and was on steroids taper who presents to the ER with complaints of nausea vomiting diarrhea and abdominal pain. Patient's symptoms have been present over the last 2 days and patient had just finished a tapering dose of steroids. Patient denies any blood in the vomitus or stools. In the ER patient was found to have hemoglobin less than his usual and he was orthostatic. Stool for occult blood was positive. Patient states his abdominal pain is mostly the right lower quadrant.  Patient has been admitted for possible Crohn's exacerbation and transfusion of PRBC for worsening anemia. Patient has just recently moved from [place] 9 months ago and has not established care over here. Patient admits to drinking alcohol every other day and last drink was the day prior to admission.   Hospital Course:  Exacerbation of Crohn's disease  -Admitted and started on IV Solumedrol, Imuran resumed (ran out of Imuran as outpatient then symp started)  -Initially kept NPO-diet slowly advanced-now diet advanced to regular  -GI consulted-underwent flexible  sigmoidoscopy and rectal ulcer was seen.  Bx results are pending at the time of d/c. -Abdominal pain improved, and diarrhea resolved.Will be on tapering prednisone, Imuran on discharge. Has follow up appt with GI on 9/30  Rectal Ulcer -Seen on Flex Sig. -Cause of significant pain.  Will give rx for 45 tabs of oxy IR 5 mg. -Unable to tolerate Canasa suppositories (NSAID allergy).  Will prescribe Anusol HC suppositories and stool softeners as needed.  Acute Blood Loss Anemia  -secondary to bleeding from Crohn's-however no overt bleeding evident  -Received 2 units of PRBCs-Hb stable post transfusion at approximately 10.5.  Thrombocytosis  -Suspect due to crohn's -likely reactive.  -Trending down on d/c.  Tobacco abuse  -Patient was advised to quit smoking  -On nicotine patch.   Alcohol Abuse -No overt signs of withdraw. -Patient was counseled to abstain from alcohol.   Procedures:  Flexible Sigmoidoscopy.  Consultations:  GI   Discharge Exam: Filed Vitals:   11/29/13 0520  BP: 132/69  Pulse: 67  Temp: 98.5 F (36.9 C)  Resp: 17   General:  Alert, orientated, Ambulating around the room. CV:  RRR no m/r/g Resp:  CTA, no w/c/r Abdomen:  Soft, mildly tender to palpation in RLQ, +bs, no organomegaly. Extremities:  4/4 strength in each.  Ambulating well.  Discharge Instructions   Discharge Instructions   Increase activity slowly    Complete by:  As directed           Current Discharge Medication List    START taking these medications   Details  dicyclomine (BENTYL) 10 MG capsule  Take 1 capsule (10 mg total) by mouth every 6 (six) hours as needed for spasms (cramping). Qty: 30 capsule, Refills: 2    hydrocortisone (ANUSOL-HC) 25 MG suppository Place 1 suppository (25 mg total) rectally 2 (two) times daily. Qty: 12 suppository, Refills: 0    Multiple Vitamin (MULTIVITAMIN WITH MINERALS) TABS tablet Take 1 tablet by mouth daily.    oxyCODONE (OXY  IR/ROXICODONE) 5 MG immediate release tablet Take 2 tablets (10 mg total) by mouth every 4 (four) hours as needed for moderate pain. After 3 days decrease to 1 tablet as needed up to every 4 hours for pain. Qty: 45 tablet, Refills: 0    pantoprazole (PROTONIX) 40 MG tablet Take 1 tablet (40 mg total) by mouth 2 (two) times daily. Qty: 60 tablet, Refills: 2    predniSONE (DELTASONE) 10 MG tablet Take 5 tablets by mouth with breakfast for 3 days, then 4 tablets by mouth with breakfast for 3 days,  Then 3 tablets by mouth with breakfast for 3 days,  then 2 tablets by mouth with breakfast for 3 days Then 1 tablet by mouth with breakfast for 3 days then stop. Qty: 45 tablet, Refills: 0      CONTINUE these medications which have CHANGED   Details  azaTHIOprine (IMURAN) 50 MG tablet Take 1 tablet (50 mg total) by mouth daily. Qty: 30 tablet, Refills: 2      CONTINUE these medications which have NOT CHANGED   Details  folic acid (FOLVITE) 1 MG tablet Take 1 tablet (1 mg total) by mouth daily. Qty: 30 tablet, Refills: 2    thiamine 100 MG tablet Take 1 tablet (100 mg total) by mouth daily. Qty: 30 tablet, Refills: 2       Allergies  Allergen Reactions  . Ibuprofen Itching  . Tramadol Other (See Comments)    irritates chrons    Follow-up Information   Follow up with Tye Savoy, NP On 12/15/2013. (appt at 10:30 a.m.--please be there 15 minutes early. Bring all your meds.)    Specialty:  Nurse Practitioner   Contact information:   [address] [place] [address] [phone]       Follow up with [facility]     On 12/01/2013. (2:15 for hospital follow up)    Contact information:   [address] [place] [id number] [phone]       The results of significant diagnostics from this hospitalization (including imaging, microbiology, ancillary and laboratory) are listed below for reference.    Significant Diagnostic Studies: Ct  Abdomen Pelvis W Contrast  11/25/2013   CLINICAL DATA:  Abdominal pain. Nausea, vomiting, and diarrhea since Wednesday. Rectal bleeding. History of Crohn's.  EXAM: CT ABDOMEN AND PELVIS WITH CONTRAST  TECHNIQUE: Multidetector CT imaging of the abdomen and pelvis was performed using the standard protocol following bolus administration of intravenous contrast.  CONTRAST:  145mL OMNIPAQUE IOHEXOL 300 MG/ML  SOLN  COMPARISON:  10/21/2013  FINDINGS: Mild dependent atelectasis in the lung bases.  Diffuse fatty infiltration of the liver. Gallbladder, spleen, pancreas, adrenal glands, abdominal aorta, inferior vena cava, and retroperitoneal lymph nodes are unremarkable. Small accessory spleen. Stomach is unremarkable. Proximal small bowel are well filled with contrast material in demonstrate no evidence of distention or significant wall thickening. Distal small bowel loops are decompressed and therefore difficult to evaluate. Postoperative changes with anterior abdominal wall mass consistent with hernia repair. 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 No free  air or free fluid in the abdomen. Tiny punctate stone demonstrated in the lower pole of the right kidney. No evidence of hydronephrosis in either kidney.  Pelvis: Rectal wall thickening and edema at, likely inflammatory. No free or loculated pelvic fluid collections. Bladder wall is not thickened. No pelvic mass or lymphadenopathy. No free or loculated pelvic fluid collections. Mild prominence of lymph nodes in the groin regions without pathologic enlargement, likely reactive. Lumbar scoliosis convex towards the left. No destructive bone lesions. Scattered areas of benign-appearing sclerosis in the pelvis.  IMPRESSION: Postoperative subtotal colectomy with ileal sigmoid anastomosis. Wall thickening in the rectum, likely inflammatory. No abscess. Visualized small bowel is unremarkable although distal small bowel are decompressed and  difficult to evaluate for inflammatory changes. Fatty infiltration of the liver. Probable punctate nonobstructing stone in the right kidney.   Electronically Signed   By: Lucienne Capers M.D.   On: 11/25/2013 22:33   Dg Abd Acute W/chest  11/25/2013   CLINICAL DATA:  Crohn's disease, lower abdominal pain and nausea, multiple prior abdominal surgeries.  EXAM: ACUTE ABDOMEN SERIES (ABDOMEN 2 VIEW & CHEST 1 VIEW)  COMPARISON:  CT of the abdomen and pelvis October 21, 2013  FINDINGS: Cardiomediastinal silhouette is unremarkable. Lungs are clear, no pleural effusions. No pneumothorax. Soft tissue planes and included osseous structures are unremarkable.  Bowel gas pattern is nondilated and nonobstructive. A few scattered bowel air-fluid levels without distension. No intra-abdominal mass effect, pathologic calcifications or free air. Soft tissue planes and included osseous structures are nonsuspicious. Lumbar levoscoliosis.  IMPRESSION: No acute cardiopulmonary process.  Nonspecific bowel gas pattern.   Electronically Signed   By: Elon Alas   On: 11/25/2013 03:25    Microbiology: Recent Results (from the past 240 hour(s))  STOOL CULTURE     Status: None   Collection Time    11/25/13 10:37 AM      Result Value Ref Range Status   Specimen Description STOOL   Final   Special Requests NONE   Final   Culture     Final   Value: NO SALMONELLA, SHIGELLA, CAMPYLOBACTER, YERSINIA, OR E.COLI 0157:H7 ISOLATED     Performed at [facility]   Report Status 11/29/2013 FINAL   Final  CLOSTRIDIUM DIFFICILE BY PCR     Status: None   Collection Time    11/25/13 10:37 AM      Result Value Ref Range Status   C difficile by pcr NEGATIVE  NEGATIVE Final     Labs: Basic Metabolic Panel:  Recent Labs Lab 11/25/13 0118 11/25/13 1130 11/26/13 0735 11/27/13 0755  NA 137 136* 138 138  K 4.2 4.5 4.2 4.4  CL 97 94* 95* 98  CO2 23 18* 27 25  GLUCOSE 86 205* 103* 92  BUN 7 7 8 9   CREATININE 0.65 0.58  0.67 0.70  CALCIUM 8.7 9.1 9.7 10.0   Liver Function Tests:  Recent Labs Lab 11/25/13 0118 11/25/13 1130 11/27/13 0755  AST 105* 71* 62*  ALT 70* 67* 69*  ALKPHOS 77 88 87  BILITOT 0.2* 0.5 0.4  PROT 7.3 8.0 7.7  ALBUMIN 3.6 3.7 3.8    Recent Labs Lab 11/25/13 1845  LIPASE 25  AMYLASE 251*   CBC:  Recent Labs Lab 11/25/13 0118 11/25/13 1130 11/26/13 0735 11/27/13 0755  WBC 8.4 11.6* 9.8 11.9*  NEUTROABS 4.6 10.9*  --   --   HGB 7.0* 8.9* 10.6* 10.5*  HCT 25.6* 30.9* 35.5* 36.5*  MCV 67.9*  73.0* 72.6* 75.3*  PLT 731* 730* 669* 567*   Signed:  Melton Alar, PA-C Darcella Gasman, PA-S2  [facility] 11/29/2013, 1:23 PM  Attending Patient was seen, examined,treatment plan was discussed with the Physician extender. I have directly reviewed the clinical findings, lab, imaging studies and management of this patient in detail. I have made the necessary changes to the above noted documentation, and agree with the documentation, as recorded by the Physician extender.  Nena Alexander MD [facility].

## 2013-12-01 ENCOUNTER — Inpatient Hospital Stay: Payer: Self-pay

## 2013-12-03 ENCOUNTER — Ambulatory Visit: Payer: Self-pay | Attending: Internal Medicine | Admitting: Internal Medicine

## 2013-12-03 VITALS — BP 135/78 | HR 87 | Temp 99.2°F | Resp 18 | Ht 63.5 in | Wt 120.0 lb

## 2013-12-03 DIAGNOSIS — K5090 Crohn's disease, unspecified, without complications: Secondary | ICD-10-CM | POA: Insufficient documentation

## 2013-12-03 DIAGNOSIS — D62 Acute posthemorrhagic anemia: Secondary | ICD-10-CM | POA: Insufficient documentation

## 2013-12-03 DIAGNOSIS — F17200 Nicotine dependence, unspecified, uncomplicated: Secondary | ICD-10-CM | POA: Insufficient documentation

## 2013-12-03 DIAGNOSIS — F1010 Alcohol abuse, uncomplicated: Secondary | ICD-10-CM | POA: Insufficient documentation

## 2013-12-03 DIAGNOSIS — Z9049 Acquired absence of other specified parts of digestive tract: Secondary | ICD-10-CM | POA: Insufficient documentation

## 2013-12-03 DIAGNOSIS — K5010 Crohn's disease of large intestine without complications: Secondary | ICD-10-CM

## 2013-12-03 DIAGNOSIS — Z79899 Other long term (current) drug therapy: Secondary | ICD-10-CM | POA: Insufficient documentation

## 2013-12-03 MED ORDER — PANTOPRAZOLE SODIUM 40 MG PO TBEC: 40.0000 mg | DELAYED_RELEASE_TABLET | Freq: Two times a day (BID) | ORAL | Status: DC

## 2013-12-03 MED ORDER — HYDROCORTISONE ACETATE 25 MG RE SUPP: 25.0000 mg | Freq: Two times a day (BID) | RECTAL | Status: DC

## 2013-12-03 MED ORDER — HYDROCORTISONE ACE-PRAMOXINE 1.85-1.15 % RE CREA: TOPICAL_CREAM | RECTAL | Status: DC

## 2013-12-03 MED ORDER — PREDNISONE 10 MG PO TABS: ORAL_TABLET | ORAL | Status: DC

## 2013-12-03 NOTE — Progress Notes (Signed)
Patient presents to establish care HFU for crohn's exacerbation Chronic RLQ abdominal pain; rates 4/10 at present

## 2013-12-03 NOTE — Progress Notes (Signed)
Patient ID: Benjamin Shelton, male   DOB: 03-13-1981, 33 y.o.   MRN: [id number]                                      [facility] Discharge Acute Issues Care Follow Up                                                                        Patient Demographics  Benjamin Shelton, is a 33 y.o. male  DOB September 17, 1980  MRN [id number].  Primary MD  No PCP Per Patient   Reason for TCC follow Up - Crohn's disease flare followup   Past Medical History  Diagnosis Date  . Crohn's disease dx'd 1991    Past Surgical History  Procedure Laterality Date  . Hernia repair      "stomach"  . Colostomy    . Partial colectomy    . Colostomy reversal    . Flexible sigmoidoscopy N/A 11/27/2013    Procedure: FLEXIBLE SIGMOIDOSCOPY;  Surgeon: Beryle Beams, MD;  Location: [facility];  Service: Endoscopy;  Laterality: N/A;       Recent HPI and Hospital Course  33 year old gentleman with history of Crohn's disease requiring multiple surgeries in the past, ongoing smoking and alcohol abuse counseled to quit both, who was recently admitted for Crohn's disease flareup and was placed on oral steroids with good effect, he comes in for routine followup postdischarge and currently symptom free and feeling at his baseline. He is no subjective complaints and he has a pending appointment with local GI office in 10 days.  [facility] Acute Care Issue to be followed in the Clinic   1. Crohn's disease with recent flare. Symptom-free in her baseline, no subjective complaints, finishing his oral steroid taper, has been placed on Imuran by [facility] and will follow with with them on the 29th of this month. His rectal is in good control with stool softeners and Anusol. Again we'll follow with GI for the same.   He was using a previous bottle of prednisone so he was provided with prednisone refills to you can see GI, he could not get further refills of Anusol so we have given him a  refill of Anusol as well.   2. Acute blood loss anemia. Received 2 units of packed RBC transfusion and last hemoglobin was 10.5, no further episodes of GI bleed. Will request [facility] to check CBC and BMP next visit.   3. Ongoing tobacco and alcohol abuse. Counseled to quit both. Continue folic acid thymine. On nicotine patches.      Subjective:   Benjamin Shelton today has, No headache, No chest pain, No abdominal pain - No Nausea, No new weakness tingling or numbness, No Cough - SOB   Assessment & Plan    Patient Active Problem List   Diagnosis Date Noted  . Severe malnutrition 11/26/2013  . Crohn disease 11/25/2013  . Microcytic hypochromic anemia 11/25/2013  . Exacerbation of Crohn's disease 11/25/2013  . Alcohol abuse 11/25/2013  . Tobacco abuse 11/25/2013  . Inflammatory bowel diseases (IBD) 10/21/2013  . Abdominal pain, other specified  site 08/02/2013  . Iron deficiency anemia, unspecified 08/02/2013  . IBD (inflammatory bowel disease) 07/29/2013       Reason for frequent admissions/ER visits Crohn's disease  Health Maintenance has gotten flu shot along with Pneumovax last week in the hospital.  Next follow up visit at [facility] - 2 weeks to monitor Crohn's disease  Any follow ups or referrals [facility] on 12/14/2013      Objective:   Filed Vitals:   12/03/13 1100  BP: 135/78  Pulse: 87  Temp: 99.2 F (37.3 C)  TempSrc: Oral  Resp: 18  Height: 5' 3.5" (1.613 m)  Weight: 120 lb (54.432 kg)  SpO2: 99%    Wt Readings from Last 3 Encounters:  12/03/13 120 lb (54.432 kg)  11/25/13 118 lb 6.4 oz (53.706 kg)  11/25/13 118 lb 6.4 oz (53.706 kg)      Medication List       This list is accurate as of: 12/03/13 11:15 AM.  Always use your most recent med list.               azaTHIOprine 50 MG tablet  Commonly known as:  IMURAN  Take 1 tablet (50 mg total) by mouth daily.     dicyclomine 10 MG capsule  Commonly known as:  BENTYL  Take 1  capsule (10 mg total) by mouth every 6 (six) hours as needed for spasms (cramping).     folic acid 1 MG tablet  Commonly known as:  FOLVITE  Take 1 tablet (1 mg total) by mouth daily.     hydrocortisone 25 MG suppository  Commonly known as:  ANUSOL-HC  Place 1 suppository (25 mg total) rectally 2 (two) times daily.     multivitamin with minerals Tabs tablet  Take 1 tablet by mouth daily.     oxyCODONE 5 MG immediate release tablet  Commonly known as:  Oxy IR/ROXICODONE  Take 2 tablets (10 mg total) by mouth every 4 (four) hours as needed for moderate pain. After 3 days decrease to 1 tablet as needed up to every 4 hours for pain.     pantoprazole 40 MG tablet  Commonly known as:  PROTONIX  Take 1 tablet (40 mg total) by mouth 2 (two) times daily.     predniSONE 10 MG tablet  Commonly known as:  DELTASONE  - Take 5 tablets by mouth with breakfast for 3 days,  - then 4 tablets by mouth with breakfast for 3 days,   - Then 3 tablets by mouth with breakfast for 3 days,  -  then 2 tablets by mouth with breakfast for 3 days  - Then 1 tablet by mouth with breakfast for 3 days then stop.     thiamine 100 MG tablet  Take 1 tablet (100 mg total) by mouth daily.          Physical Exam  Awake Alert, Oriented X 3, No new F.N deficits, Normal affect [place].AT,PERRAL Supple Neck,No JVD, No cervical lymphadenopathy appriciated.  Symmetrical Chest wall movement, Good air movement bilaterally, CTAB RRR,No Gallops,Rubs or new Murmurs, No Parasternal Heave +ve B.Sounds, Abd Soft, No tenderness, No organomegaly appriciated, No rebound - guarding or rigidity. Old abdominal surgery scar stable. No Cyanosis, Clubbing or edema, No new Rash or bruise     Data Review   Micro Results Recent Results (from the past 240 hour(s))  STOOL CULTURE     Status: None   Collection Time    11/25/13 10:37 AM  Result Value Ref Range Status   Specimen Description STOOL   Final   Special Requests NONE    Final   Culture     Final   Value: NO SALMONELLA, SHIGELLA, CAMPYLOBACTER, YERSINIA, OR E.COLI 0157:H7 ISOLATED     Performed at [facility]   Report Status 11/29/2013 FINAL   Final  CLOSTRIDIUM DIFFICILE BY PCR     Status: None   Collection Time    11/25/13 10:37 AM      Result Value Ref Range Status   C difficile by pcr NEGATIVE  NEGATIVE Final     CBC  Recent Labs Lab 11/27/13 0755  WBC 11.9*  HGB 10.5*  HCT 36.5*  PLT 567*  MCV 75.3*  MCH 21.6*  MCHC 28.8*  RDW 21.8*    Chemistries   Recent Labs Lab 11/27/13 0755  NA 138  K 4.4  CL 98  CO2 25  GLUCOSE 92  BUN 9  CREATININE 0.70  CALCIUM 10.0  AST 62*  ALT 69*  ALKPHOS 87  BILITOT 0.4   ------------------------------------------------------------------------------------------------------------------ estimated creatinine clearance is 100.1 ml/min (by C-G formula based on Cr of 0.7). ------------------------------------------------------------------------------------------------------------------ No results found for this basename: HGBA1C,  in the last 72 hours ------------------------------------------------------------------------------------------------------------------ No results found for this basename: CHOL, HDL, LDLCALC, TRIG, CHOLHDL, LDLDIRECT,  in the last 72 hours ------------------------------------------------------------------------------------------------------------------ No results found for this basename: TSH, T4TOTAL, FREET3, T3FREE, THYROIDAB,  in the last 72 hours ------------------------------------------------------------------------------------------------------------------ No results found for this basename: VITAMINB12, FOLATE, FERRITIN, TIBC, IRON, RETICCTPCT,  in the last 72 hours  Coagulation profile  Recent Labs Lab 11/27/13 1135  INR 0.95    No results found for this basename: DDIMER,  in the last 72 hours  Cardiac Enzymes No results found for this  basename: CK, CKMB, TROPONINI, MYOGLOBIN,  in the last 168 hours ------------------------------------------------------------------------------------------------------------------ No components found with this basename: POCBNP,    Time Spent in minutes 25  Lala Lund K M.D on 12/03/2013 at 11:15 AM   **Disclaimer: This note may have been dictated with voice recognition software. Similar sounding words can inadvertently be transcribed and this note may contain transcription errors which may not have been corrected upon publication of note.**

## 2013-12-03 NOTE — Patient Instructions (Addendum)
Follow with the GI MD as told before on 12-14-13 at 10am  Stop smoking and stop drinking alcohol a regular basis.

## 2013-12-03 NOTE — Addendum Note (Signed)
Addended by: Thurnell Lose on: 12/03/2013 11:43 AM   Modules accepted: Orders

## 2013-12-10 ENCOUNTER — Encounter: Payer: Self-pay | Admitting: *Deleted

## 2013-12-15 ENCOUNTER — Ambulatory Visit: Payer: Self-pay | Admitting: Nurse Practitioner

## 2013-12-27 ENCOUNTER — Ambulatory Visit: Payer: Self-pay | Admitting: Nurse Practitioner

## 2013-12-27 ENCOUNTER — Telehealth: Payer: Self-pay | Admitting: *Deleted

## 2013-12-27 NOTE — Telephone Encounter (Signed)
Called patient  Patient rescheduled to Wednesday with Benjamin Shelton Patient requested our pick up service does not have a car I advised we do not have a pick service Patient verbalized understanding will obtain a ride

## 2013-12-29 ENCOUNTER — Encounter: Payer: Self-pay | Admitting: Gastroenterology

## 2013-12-29 ENCOUNTER — Telehealth: Payer: Self-pay | Admitting: *Deleted

## 2013-12-29 NOTE — Telephone Encounter (Signed)
Called patient and he could not get a ride for today.  I rescheduled him with Alonza Bogus PA for 01-06-2014 at 1:30 PM . I told him to call us if he cannot get a ride for this visit , 24 to 48 hours ahead of time if he can. Advised him to bring his medication list, insurance cards. He said he didn't have insurance.

## 2013-12-29 NOTE — Progress Notes (Signed)
Patient ID: Benjamin Shelton, male   DOB: November 01, 1980, 33 y.o.   MRN: [id number] Called the patient and asked him why he did not come to the appointment with Alonza Bogus PA-C. He said he had no ride for today. I rescheduled him for 01-06-2014 at 1:30 PM.  I advised him to call us 24 to 48 hours ahead of time if he cannot make the appointment.  To him to bring medication list and insurance cards. He said he had no insurance.

## 2014-01-06 ENCOUNTER — Ambulatory Visit: Payer: Self-pay | Admitting: Gastroenterology

## 2014-04-09 ENCOUNTER — Encounter (HOSPITAL_BASED_OUTPATIENT_CLINIC_OR_DEPARTMENT_OTHER): Payer: Self-pay | Admitting: *Deleted

## 2014-04-09 ENCOUNTER — Emergency Department (HOSPITAL_BASED_OUTPATIENT_CLINIC_OR_DEPARTMENT_OTHER): Payer: Self-pay

## 2014-04-09 ENCOUNTER — Inpatient Hospital Stay (HOSPITAL_BASED_OUTPATIENT_CLINIC_OR_DEPARTMENT_OTHER)
Admission: EM | Admit: 2014-04-09 | Discharge: 2014-04-15 | DRG: 872 | Disposition: A | Discharge status: Home / Self Care | Payer: Self-pay | Attending: Internal Medicine | Admitting: Internal Medicine

## 2014-04-09 DIAGNOSIS — K6289 Other specified diseases of anus and rectum: Secondary | ICD-10-CM | POA: Diagnosis present

## 2014-04-09 DIAGNOSIS — F17210 Nicotine dependence, cigarettes, uncomplicated: Secondary | ICD-10-CM | POA: Diagnosis present

## 2014-04-09 DIAGNOSIS — Z9119 Patient's noncompliance with other medical treatment and regimen: Secondary | ICD-10-CM | POA: Diagnosis present

## 2014-04-09 DIAGNOSIS — L988 Other specified disorders of the skin and subcutaneous tissue: Secondary | ICD-10-CM | POA: Diagnosis present

## 2014-04-09 DIAGNOSIS — D649 Anemia, unspecified: Secondary | ICD-10-CM

## 2014-04-09 DIAGNOSIS — D500 Iron deficiency anemia secondary to blood loss (chronic): Secondary | ICD-10-CM | POA: Diagnosis present

## 2014-04-09 DIAGNOSIS — R6510 Systemic inflammatory response syndrome (SIRS) of non-infectious origin without acute organ dysfunction: Secondary | ICD-10-CM | POA: Diagnosis present

## 2014-04-09 DIAGNOSIS — Z888 Allergy status to other drugs, medicaments and biological substances status: Secondary | ICD-10-CM

## 2014-04-09 DIAGNOSIS — Z933 Colostomy status: Secondary | ICD-10-CM

## 2014-04-09 DIAGNOSIS — E872 Acidosis: Secondary | ICD-10-CM | POA: Diagnosis present

## 2014-04-09 DIAGNOSIS — R569 Unspecified convulsions: Secondary | ICD-10-CM | POA: Diagnosis present

## 2014-04-09 DIAGNOSIS — A419 Sepsis, unspecified organism: Principal | ICD-10-CM | POA: Diagnosis present

## 2014-04-09 DIAGNOSIS — R740 Nonspecific elevation of levels of transaminase and lactic acid dehydrogenase [LDH]: Secondary | ICD-10-CM | POA: Diagnosis present

## 2014-04-09 DIAGNOSIS — Z8719 Personal history of other diseases of the digestive system: Secondary | ICD-10-CM

## 2014-04-09 DIAGNOSIS — F10931 Alcohol use, unspecified with withdrawal delirium: Secondary | ICD-10-CM | POA: Diagnosis not present

## 2014-04-09 DIAGNOSIS — R1084 Generalized abdominal pain: Secondary | ICD-10-CM | POA: Insufficient documentation

## 2014-04-09 DIAGNOSIS — Y909 Presence of alcohol in blood, level not specified: Secondary | ICD-10-CM | POA: Diagnosis present

## 2014-04-09 DIAGNOSIS — D509 Iron deficiency anemia, unspecified: Secondary | ICD-10-CM | POA: Diagnosis present

## 2014-04-09 DIAGNOSIS — D638 Anemia in other chronic diseases classified elsewhere: Secondary | ICD-10-CM | POA: Diagnosis present

## 2014-04-09 DIAGNOSIS — R1031 Right lower quadrant pain: Secondary | ICD-10-CM

## 2014-04-09 DIAGNOSIS — K5090 Crohn's disease, unspecified, without complications: Secondary | ICD-10-CM | POA: Diagnosis present

## 2014-04-09 DIAGNOSIS — R109 Unspecified abdominal pain: Secondary | ICD-10-CM

## 2014-04-09 DIAGNOSIS — Z886 Allergy status to analgesic agent status: Secondary | ICD-10-CM

## 2014-04-09 DIAGNOSIS — Z8673 Personal history of transient ischemic attack (TIA), and cerebral infarction without residual deficits: Secondary | ICD-10-CM

## 2014-04-09 DIAGNOSIS — F10231 Alcohol dependence with withdrawal delirium: Secondary | ICD-10-CM | POA: Diagnosis present

## 2014-04-09 DIAGNOSIS — R1011 Right upper quadrant pain: Secondary | ICD-10-CM

## 2014-04-09 DIAGNOSIS — G8929 Other chronic pain: Secondary | ICD-10-CM | POA: Diagnosis present

## 2014-04-09 DIAGNOSIS — E876 Hypokalemia: Secondary | ICD-10-CM | POA: Diagnosis present

## 2014-04-09 DIAGNOSIS — K50911 Crohn's disease, unspecified, with rectal bleeding: Secondary | ICD-10-CM

## 2014-04-09 LAB — URINALYSIS, ROUTINE W REFLEX MICROSCOPIC
BILIRUBIN URINE: NEGATIVE
GLUCOSE, UA: NEGATIVE mg/dL
Ketones, ur: NEGATIVE mg/dL
Leukocytes, UA: NEGATIVE
NITRITE: NEGATIVE
PROTEIN: 30 mg/dL — AB
SPECIFIC GRAVITY, URINE: 1.018 (ref 1.005–1.030)
Urobilinogen, UA: 0.2 mg/dL (ref 0.0–1.0)
pH: 5.5 (ref 5.0–8.0)

## 2014-04-09 LAB — CBC WITH DIFFERENTIAL/PLATELET
BAND NEUTROPHILS: 1 % (ref 0–10)
BASOS ABS: 0.2 10*3/uL — AB (ref 0.0–0.1)
BLASTS: 0 %
Basophils Relative: 2 % — ABNORMAL HIGH (ref 0–1)
EOS PCT: 0 % (ref 0–5)
Eosinophils Absolute: 0 10*3/uL (ref 0.0–0.7)
HCT: 21.2 % — ABNORMAL LOW (ref 39.0–52.0)
HEMOGLOBIN: 5.5 g/dL — AB (ref 13.0–17.0)
Lymphocytes Relative: 10 % — ABNORMAL LOW (ref 12–46)
Lymphs Abs: 1.2 10*3/uL (ref 0.7–4.0)
MCH: 17.9 pg — ABNORMAL LOW (ref 26.0–34.0)
MCHC: 25.9 g/dL — ABNORMAL LOW (ref 30.0–36.0)
MCV: 69.1 fL — AB (ref 78.0–100.0)
MONO ABS: 0.8 10*3/uL (ref 0.1–1.0)
MONOS PCT: 7 % (ref 3–12)
MYELOCYTES: 0 %
Metamyelocytes Relative: 0 %
NEUTROS ABS: 9.4 10*3/uL — AB (ref 1.7–7.7)
NEUTROS PCT: 80 % — AB (ref 43–77)
NRBC: 0 /100{WBCs}
Platelets: 610 10*3/uL — ABNORMAL HIGH (ref 150–400)
Promyelocytes Absolute: 0 %
RBC: 3.07 MIL/uL — AB (ref 4.22–5.81)
RDW: 21 % — ABNORMAL HIGH (ref 11.5–15.5)
WBC: 11.6 10*3/uL — AB (ref 4.0–10.5)

## 2014-04-09 LAB — COMPREHENSIVE METABOLIC PANEL
ALT: 94 U/L — AB (ref 0–53)
AST: 133 U/L — ABNORMAL HIGH (ref 0–37)
Albumin: 4.1 g/dL (ref 3.5–5.2)
Alkaline Phosphatase: 138 U/L — ABNORMAL HIGH (ref 39–117)
Anion gap: 16 — ABNORMAL HIGH (ref 5–15)
BUN: 8 mg/dL (ref 6–23)
CHLORIDE: 99 mmol/L (ref 96–112)
CO2: 14 mmol/L — AB (ref 19–32)
Calcium: 8.8 mg/dL (ref 8.4–10.5)
Creatinine, Ser: 0.93 mg/dL (ref 0.50–1.35)
Glucose, Bld: 108 mg/dL — ABNORMAL HIGH (ref 70–99)
Potassium: 3.6 mmol/L (ref 3.5–5.1)
Sodium: 129 mmol/L — ABNORMAL LOW (ref 135–145)
Total Bilirubin: 0.6 mg/dL (ref 0.3–1.2)
Total Protein: 7.5 g/dL (ref 6.0–8.3)

## 2014-04-09 LAB — URINE MICROSCOPIC-ADD ON

## 2014-04-09 LAB — RAPID URINE DRUG SCREEN, HOSP PERFORMED
AMPHETAMINES: NOT DETECTED
BENZODIAZEPINES: NOT DETECTED
Barbiturates: NOT DETECTED
Cocaine: NOT DETECTED
OPIATES: NOT DETECTED
TETRAHYDROCANNABINOL: POSITIVE — AB

## 2014-04-09 LAB — PREPARE RBC (CROSSMATCH)

## 2014-04-09 LAB — I-STAT CG4 LACTIC ACID, ED: LACTIC ACID, VENOUS: 10.69 mmol/L — AB (ref 0.5–2.0)

## 2014-04-09 LAB — ETHANOL: Alcohol, Ethyl (B): <5 mg/dL (ref 0–9)

## 2014-04-09 MED ORDER — MORPHINE SULFATE 4 MG/ML IJ SOLN
4.0000 mg | INTRAMUSCULAR | Status: DC | PRN
  Administered 2014-04-09 – 2014-04-10 (×5): 4 mg via INTRAVENOUS
  Filled 2014-04-09 (×5): qty 1

## 2014-04-09 MED ORDER — LORAZEPAM 1 MG PO TABS
1.0000 mg | ORAL_TABLET | Freq: Four times a day (QID) | ORAL | Status: DC | PRN
  Administered 2014-04-09 – 2014-04-10 (×3): 1 mg via ORAL
  Filled 2014-04-09 (×3): qty 1

## 2014-04-09 MED ORDER — VITAMIN B-1 100 MG PO TABS: 100.0000 mg | ORAL_TABLET | Freq: Every day | ORAL | Status: DC

## 2014-04-09 MED ORDER — IOHEXOL 300 MG/ML  SOLN
25.0000 mL | INTRAMUSCULAR | Status: AC
  Administered 2014-04-09 (×2): 25 mL via ORAL

## 2014-04-09 MED ORDER — MORPHINE SULFATE 4 MG/ML IJ SOLN
4.0000 mg | Freq: Once | INTRAMUSCULAR | Status: AC
  Administered 2014-04-09: 4 mg via INTRAVENOUS
  Filled 2014-04-09: qty 1

## 2014-04-09 MED ORDER — LORAZEPAM 2 MG/ML IJ SOLN
1.0000 mg | Freq: Once | INTRAMUSCULAR | Status: AC
  Administered 2014-04-09: 1 mg via INTRAVENOUS
  Filled 2014-04-09: qty 1

## 2014-04-09 MED ORDER — AZATHIOPRINE 50 MG PO TABS
50.0000 mg | ORAL_TABLET | Freq: Every day | ORAL | Status: DC
  Administered 2014-04-09 – 2014-04-15 (×7): 50 mg via ORAL
  Filled 2014-04-09 (×7): qty 1

## 2014-04-09 MED ORDER — THIAMINE HCL 100 MG/ML IJ SOLN
100.0000 mg | Freq: Every day | INTRAMUSCULAR | Status: DC
  Filled 2014-04-09 (×2): qty 1

## 2014-04-09 MED ORDER — LORAZEPAM 2 MG/ML IJ SOLN: 2.0000 mg | Freq: Four times a day (QID) | INTRAMUSCULAR | Status: DC | PRN

## 2014-04-09 MED ORDER — PIPERACILLIN-TAZOBACTAM 3.375 G IVPB
3.3750 g | Freq: Three times a day (TID) | INTRAVENOUS | Status: DC
  Administered 2014-04-09 – 2014-04-14 (×15): 3.375 g via INTRAVENOUS
  Filled 2014-04-09 (×18): qty 50

## 2014-04-09 MED ORDER — VITAMIN B-1 100 MG PO TABS
100.0000 mg | ORAL_TABLET | Freq: Every day | ORAL | Status: DC
  Administered 2014-04-09 – 2014-04-15 (×7): 100 mg via ORAL
  Filled 2014-04-09 (×7): qty 1

## 2014-04-09 MED ORDER — MORPHINE SULFATE 4 MG/ML IJ SOLN
4.0000 mg | Freq: Once | INTRAMUSCULAR | Status: AC
  Administered 2014-04-09: 4 mg via INTRAVENOUS

## 2014-04-09 MED ORDER — ADULT MULTIVITAMIN W/MINERALS CH
1.0000 | ORAL_TABLET | Freq: Every day | ORAL | Status: DC
  Administered 2014-04-09 – 2014-04-15 (×7): 1 via ORAL
  Filled 2014-04-09 (×7): qty 1

## 2014-04-09 MED ORDER — LORAZEPAM 2 MG/ML IJ SOLN: 1.0000 mg | Freq: Four times a day (QID) | INTRAMUSCULAR | Status: DC | PRN

## 2014-04-09 MED ORDER — SODIUM CHLORIDE 0.9 % IJ SOLN
3.0000 mL | Freq: Two times a day (BID) | INTRAMUSCULAR | Status: DC
  Administered 2014-04-09 – 2014-04-15 (×9): 3 mL via INTRAVENOUS

## 2014-04-09 MED ORDER — SODIUM CHLORIDE 0.9 % IV SOLN
INTRAVENOUS | Status: DC
  Administered 2014-04-09 – 2014-04-14 (×3): via INTRAVENOUS

## 2014-04-09 MED ORDER — MORPHINE SULFATE 4 MG/ML IJ SOLN
INTRAMUSCULAR | Status: AC
  Administered 2014-04-09: 4 mg via INTRAVENOUS
  Filled 2014-04-09: qty 1

## 2014-04-09 MED ORDER — SODIUM CHLORIDE 0.9 % IV SOLN
Freq: Once | INTRAVENOUS | Status: AC
  Administered 2014-04-10: via INTRAVENOUS

## 2014-04-09 MED ORDER — FOLIC ACID 1 MG PO TABS
1.0000 mg | ORAL_TABLET | Freq: Every day | ORAL | Status: DC
  Administered 2014-04-09 – 2014-04-15 (×7): 1 mg via ORAL
  Filled 2014-04-09 (×7): qty 1
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
## 2014-04-09 NOTE — ED Notes (Signed)
Critical HGB called from lab at 5.5. Reported to Dr Stark Jock.
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
## 2014-04-09 NOTE — ED Notes (Signed)
Dr Stark Jock aware of pt c/o back pain.
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
## 2014-04-10 ENCOUNTER — Encounter (HOSPITAL_COMMUNITY): Payer: Self-pay | Admitting: Radiology

## 2014-04-10 ENCOUNTER — Inpatient Hospital Stay (HOSPITAL_COMMUNITY): Payer: Self-pay

## 2014-04-10 LAB — LACTIC ACID, PLASMA: Lactic Acid, Venous: 1.1 mmol/L (ref 0.5–2.0)

## 2014-04-10 LAB — CBC
HCT: 28.7 % — ABNORMAL LOW (ref 39.0–52.0)
Hemoglobin: 8.3 g/dL — ABNORMAL LOW (ref 13.0–17.0)
MCH: 21.1 pg — ABNORMAL LOW (ref 26.0–34.0)
MCHC: 28.9 g/dL — ABNORMAL LOW (ref 30.0–36.0)
MCV: 72.8 fL — ABNORMAL LOW (ref 78.0–100.0)
Platelets: 454 10*3/uL — ABNORMAL HIGH (ref 150–400)
RBC: 3.94 MIL/uL — AB (ref 4.22–5.81)
RDW: 21.6 % — AB (ref 11.5–15.5)
WBC: 9.2 10*3/uL (ref 4.0–10.5)

## 2014-04-10 LAB — COMPREHENSIVE METABOLIC PANEL
ALBUMIN: 3.4 g/dL — AB (ref 3.5–5.2)
ALT: 72 U/L — ABNORMAL HIGH (ref 0–53)
AST: 71 U/L — ABNORMAL HIGH (ref 0–37)
Alkaline Phosphatase: 113 U/L (ref 39–117)
Anion gap: 10 (ref 5–15)
BUN: <5 mg/dL — ABNORMAL LOW (ref 6–23)
CO2: 24 mmol/L (ref 19–32)
CREATININE: 0.96 mg/dL (ref 0.50–1.35)
Calcium: 8.5 mg/dL (ref 8.4–10.5)
Chloride: 102 mmol/L (ref 96–112)
GFR calc Af Amer: >90 mL/min (ref >90)
GFR calc non Af Amer: >90 mL/min (ref >90)
GLUCOSE: 76 mg/dL (ref 70–99)
Potassium: 3.5 mmol/L (ref 3.5–5.1)
Sodium: 136 mmol/L (ref 135–145)
Total Bilirubin: 1.8 mg/dL — ABNORMAL HIGH (ref 0.3–1.2)
Total Protein: 6.4 g/dL (ref 6.0–8.3)

## 2014-04-10 LAB — IRON AND TIBC
Iron: 18 ug/dL — ABNORMAL LOW (ref 42–165)
Saturation Ratios: 4 % — ABNORMAL LOW (ref 20–55)
TIBC: 460 ug/dL — ABNORMAL HIGH (ref 215–435)
UIBC: 442 ug/dL — ABNORMAL HIGH (ref 125–400)

## 2014-04-10 LAB — FERRITIN: FERRITIN: 10 ng/mL — AB (ref 22–322)

## 2014-04-10 LAB — MRSA PCR SCREENING: MRSA by PCR: NEGATIVE

## 2014-04-10 MED ORDER — CHLORHEXIDINE GLUCONATE 0.12 % MT SOLN
15.0000 mL | Freq: Two times a day (BID) | OROMUCOSAL | Status: DC
  Administered 2014-04-10 – 2014-04-14 (×8): 15 mL via OROMUCOSAL
  Filled 2014-04-10 (×13): qty 15

## 2014-04-10 MED ORDER — LORAZEPAM 1 MG PO TABS: 0.0000 mg | ORAL_TABLET | Freq: Two times a day (BID) | ORAL | Status: DC

## 2014-04-10 MED ORDER — LORAZEPAM 2 MG/ML IJ SOLN: 2.0000 mg | INTRAMUSCULAR | Status: DC | PRN

## 2014-04-10 MED ORDER — SODIUM CHLORIDE 0.9 % IV SOLN
1000.0000 mg | Freq: Once | INTRAVENOUS | Status: AC
  Administered 2014-04-11: 1000 mg via INTRAVENOUS
  Filled 2014-04-10: qty 20

## 2014-04-10 MED ORDER — CETYLPYRIDINIUM CHLORIDE 0.05 % MT LIQD
7.0000 mL | Freq: Two times a day (BID) | OROMUCOSAL | Status: DC
  Administered 2014-04-10 – 2014-04-14 (×8): 7 mL via OROMUCOSAL

## 2014-04-10 MED ORDER — LORAZEPAM 2 MG/ML IJ SOLN: 0.5000 mg | INTRAMUSCULAR | Status: DC | PRN

## 2014-04-10 MED ORDER — LORAZEPAM 1 MG PO TABS
1.0000 mg | ORAL_TABLET | Freq: Four times a day (QID) | ORAL | Status: AC | PRN
  Administered 2014-04-13: 1 mg via ORAL
  Filled 2014-04-10: qty 1

## 2014-04-10 MED ORDER — LORAZEPAM 2 MG/ML IJ SOLN: 1.0000 mg | Freq: Four times a day (QID) | INTRAMUSCULAR | Status: AC | PRN

## 2014-04-10 MED ORDER — MORPHINE SULFATE 2 MG/ML IJ SOLN
2.0000 mg | INTRAMUSCULAR | Status: DC | PRN
  Administered 2014-04-10 (×2): 2 mg via INTRAVENOUS
  Administered 2014-04-11: 4 mg via INTRAVENOUS
  Administered 2014-04-11: 2 mg via INTRAVENOUS
  Administered 2014-04-11 (×3): 4 mg via INTRAVENOUS
  Administered 2014-04-11: 2 mg via INTRAVENOUS
  Administered 2014-04-12 – 2014-04-13 (×10): 4 mg via INTRAVENOUS
  Administered 2014-04-14 (×3): 2 mg via INTRAVENOUS
  Administered 2014-04-14: 4 mg via INTRAVENOUS
  Administered 2014-04-15 (×2): 2 mg via INTRAVENOUS
  Filled 2014-04-10 (×2): qty 1
  Filled 2014-04-10 (×11): qty 2
  Filled 2014-04-10 (×3): qty 1
  Filled 2014-04-10 (×2): qty 2
  Filled 2014-04-10: qty 1
  Filled 2014-04-10: qty 2
  Filled 2014-04-10: qty 1
  Filled 2014-04-10 (×2): qty 2
  Filled 2014-04-10 (×3): qty 1
  Filled 2014-04-10: qty 2

## 2014-04-10 MED ORDER — IOHEXOL 300 MG/ML  SOLN
100.0000 mL | Freq: Once | INTRAMUSCULAR | Status: AC | PRN
  Administered 2014-04-10: 100 mL via INTRAVENOUS

## 2014-04-10 MED ORDER — SODIUM CHLORIDE 0.9 % IV SOLN
25.0000 mg | Freq: Once | INTRAVENOUS | Status: AC
  Administered 2014-04-10: 25 mg via INTRAVENOUS
  Filled 2014-04-10: qty 0.5

## 2014-04-10 MED ORDER — LORAZEPAM 1 MG PO TABS
0.0000 mg | ORAL_TABLET | Freq: Four times a day (QID) | ORAL | Status: DC
  Administered 2014-04-10: 1 mg via ORAL
  Administered 2014-04-11: 3 mg via ORAL
  Administered 2014-04-11: 2 mg via ORAL
  Administered 2014-04-11: 4 mg via ORAL
  Filled 2014-04-10: qty 2
  Filled 2014-04-10: qty 1
  Filled 2014-04-10 (×2): qty 4

## 2014-04-10 NOTE — Progress Notes (Signed)
Patient admitted 04/09/14, per pt- he drinks 2 beers and 3 shots of liquor daily. Last drink being 1/23. Pt is tachycardic, having tremors (which he states is ongoing for a few months) and is sweating. [facility] paged. Lamar Blinks, NP responded. Patient situation explained. New orders were given. Will continue to monitor patient.
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
## 2014-04-10 NOTE — Progress Notes (Signed)
Utilization review completed.  

## 2014-04-11 LAB — CBC
HCT: 28 % — ABNORMAL LOW (ref 39.0–52.0)
HEMOGLOBIN: 8.3 g/dL — AB (ref 13.0–17.0)
MCH: 21.8 pg — AB (ref 26.0–34.0)
MCHC: 29.6 g/dL — ABNORMAL LOW (ref 30.0–36.0)
MCV: 73.7 fL — ABNORMAL LOW (ref 78.0–100.0)
Platelets: 409 10*3/uL — ABNORMAL HIGH (ref 150–400)
RBC: 3.8 MIL/uL — ABNORMAL LOW (ref 4.22–5.81)
RDW: 21.1 % — ABNORMAL HIGH (ref 11.5–15.5)
WBC: 10.8 10*3/uL — ABNORMAL HIGH (ref 4.0–10.5)

## 2014-04-11 LAB — TYPE AND SCREEN
ABO/RH(D): O POS
Antibody Screen: NEGATIVE
Unit division: 0
Unit division: 0

## 2014-04-11 LAB — COMPREHENSIVE METABOLIC PANEL
ALK PHOS: 107 U/L (ref 39–117)
ALT: 53 U/L (ref 0–53)
AST: 51 U/L — ABNORMAL HIGH (ref 0–37)
Albumin: 3 g/dL — ABNORMAL LOW (ref 3.5–5.2)
Anion gap: 11 (ref 5–15)
BILIRUBIN TOTAL: 1.2 mg/dL (ref 0.3–1.2)
BUN: <5 mg/dL — ABNORMAL LOW (ref 6–23)
CALCIUM: 8.1 mg/dL — AB (ref 8.4–10.5)
CHLORIDE: 99 mmol/L (ref 96–112)
CO2: 26 mmol/L (ref 19–32)
Creatinine, Ser: 0.78 mg/dL (ref 0.50–1.35)
GFR calc non Af Amer: >90 mL/min (ref >90)
Glucose, Bld: 105 mg/dL — ABNORMAL HIGH (ref 70–99)
POTASSIUM: 2.9 mmol/L — AB (ref 3.5–5.1)
Sodium: 136 mmol/L (ref 135–145)
Total Protein: 6.3 g/dL (ref 6.0–8.3)

## 2014-04-11 LAB — APTT: aPTT: 48 seconds — ABNORMAL HIGH (ref 24–37)

## 2014-04-11 LAB — PROTIME-INR
INR: 1.09 (ref 0.00–1.49)
Prothrombin Time: 14.2 seconds (ref 11.6–15.2)

## 2014-04-11 MED ORDER — POTASSIUM CHLORIDE CRYS ER 20 MEQ PO TBCR
40.0000 meq | EXTENDED_RELEASE_TABLET | Freq: Once | ORAL | Status: AC
  Administered 2014-04-11: 40 meq via ORAL

## 2014-04-11 MED ORDER — PREDNISONE 20 MG PO TABS
20.0000 mg | ORAL_TABLET | Freq: Every day | ORAL | Status: DC
  Administered 2014-04-11 – 2014-04-15 (×5): 20 mg via ORAL
  Filled 2014-04-11 (×6): qty 1

## 2014-04-11 MED ORDER — POTASSIUM CHLORIDE CRYS ER 20 MEQ PO TBCR
40.0000 meq | EXTENDED_RELEASE_TABLET | Freq: Two times a day (BID) | ORAL | Status: AC
  Administered 2014-04-11 – 2014-04-12 (×3): 40 meq via ORAL
  Filled 2014-04-11 (×4): qty 2

## 2014-04-11 NOTE — Progress Notes (Signed)
[facility] TEAM 1 - Stepdown/ICU TEAM Progress Note  Kionte Baumgardner TXH:[id number] DOB: Jan 07, 1981 DOA: 04/09/2014 PCP: No PCP Per Patient  Admit HPI / Brief Narrative: 34 y.o. male with h/o Crohn's disease who presented to the ED at [facility] after a seizure, with no prior hx of this. Was riding in car (as a passenger) when he had sudden onset of seizure activity which lasted several mins and resolved spontaneously.  Patient was initially confused but was alert and oriented after arrival to ED. Had mild abdominal pain before seizure (chronic abdominal pain from crohns) and developed fever and chills after arrival (100.4 on arrival).  Work up in ED demonstrated HGB of 5.5. He was admitted in September with chronic blood loss anemia / iron deficiency anemia from Crohn's and had a HGB on admission at that time of 7.0 which improved to 10.5 after transfusion. Iron studies done last May confirmed a fairly profound Iron deficiency anemia with Iron level of 11. Patient had not been on any form of Iron supplementation since his discharge in Sept.  HPI/Subjective: Pt states he feels much better in general.  He reports only mild pain in his low back.  He is hungry.  He denies dizziness or other presyncopal sx.  No cp or sob.     Assessment/Plan:  Seizure v/s syncopal convulsions  Single episode - no prior hx of same - CT head unrevealing - suspect this was actually an episode of orthostatic syncope w/ convulsions relate to severe anemia - no recurrence in hospital   Fe deficiency anemia - chronic blood loss anemia  Hgb improved nicely w/ transfusion and is holding steady at 8.3 - follow trend - Fe 18 w/ TIBC of 460 - loaded w/ full dose IV Fe 1/24  SIRS v/s sepsis (Temp 100.4 + HR 130) SIRS physiology has resolved   Hypokalemia  Replace and follow trend   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 and developing  fistula near upper rectum but no abscess is noted - awaiting suggestions from GI MD - GI PA has written for diet   ?EtOH abuse Developed s/sx of mild to moderate withdrawal last night requiring CIWA - currently well controlled w/ no evidence of worsening sx   Transaminitis  Essentially normalized w/ volume expansion   Lactic acidosis  Resolved  Code Status: FULL Family Communication: no family present at time of exam Disposition Plan: SDU - await GI recs to determine on transfer v/s d/c home   Consultants: none  Procedures: [facility]   Antibiotics: Zosyn 1/23 >  DVT prophylaxis: SCDs  Objective: Blood pressure 128/81, pulse 82, temperature 99 F (37.2 C), temperature source Oral, resp. rate 11, height 5' 3.5" (1.613 m), weight 51.3 kg (113 lb 1.5 oz), SpO2 95 %.  Intake/Output Summary (Last 24 hours) at 04/11/14 1534 Last data filed at 04/11/14 1500  Gross per 24 hour  Intake   4442 ml  Output   1475 ml  Net   2967 ml   Exam: General: No acute respiratory distress - alert, conversant, and oriented  Lungs: Clear to auscultation bilaterally without wheezes or crackles Cardiovascular: Regular rate and rhythm without murmur gallop or rub normal S1 and S2 Abdomen: less tender today diffusely - no rebound, no mass - bs hypoactive  Extremities: No significant cyanosis, clubbing, or edema bilateral lower extremities  Data Reviewed: Basic Metabolic Panel:  Recent Labs Lab 04/09/14 1745 04/10/14 0600  04/11/14 0825  NA 129* 136 136  K 3.6 3.5 2.9*  CL 99 102 99  CO2 14* 24 26  GLUCOSE 108* 76 105*  BUN 8 <5* <5*  CREATININE 0.93 0.96 0.78  CALCIUM 8.8 8.5 8.1*    Liver Function Tests:  Recent Labs Lab 04/09/14 1745 04/10/14 0600 04/11/14 0825  AST 133* 71* 51*  ALT 94* 72* 53  ALKPHOS 138* 113 107  BILITOT 0.6 1.8* 1.2  PROT 7.5 6.4 6.3  ALBUMIN 4.1 3.4* 3.0*   Coags:  Recent Labs Lab 04/11/14 0825  INR 1.09    Recent Labs Lab 04/11/14 0825   APTT 48*    CBC:  Recent Labs Lab 04/09/14 1745 04/10/14 0600 04/11/14 0825  WBC 11.6* 9.2 10.8*  NEUTROABS 9.4*  --   --   HGB 5.5* 8.3* 8.3*  HCT 21.2* 28.7* 28.0*  MCV 69.1* 72.8* 73.7*  PLT 610* 454* 409*    Recent Results (from the past 240 hour(s))  MRSA PCR Screening     Status: None   Collection Time: 04/09/14  9:42 PM  Result Value Ref Range Status   MRSA by PCR NEGATIVE NEGATIVE Final    Comment:        The GeneXpert MRSA Assay (FDA approved for NASAL specimens only), is one component of a comprehensive MRSA colonization surveillance program. It is not intended to diagnose MRSA infection nor to guide or monitor treatment for MRSA infections.   Culture, blood (routine x 2)     Status: None (Preliminary result)   Collection Time: 04/09/14 10:22 PM  Result Value Ref Range Status   Specimen Description BLOOD RIGHT ANTECUBITAL  Final   Special Requests   Final    BOTTLES DRAWN AEROBIC AND ANAEROBIC 10CC BLUE [place] RED   Culture   Final           BLOOD CULTURE RECEIVED NO GROWTH TO DATE CULTURE WILL BE HELD FOR 5 DAYS BEFORE ISSUING A FINAL NEGATIVE REPORT Performed at [facility]    Report Status PENDING  Incomplete  Culture, blood (routine x 2)     Status: None (Preliminary result)   Collection Time: 04/09/14 10:30 PM  Result Value Ref Range Status   Specimen Description BLOOD LEFT HAND  Final   Special Requests BOTTLES DRAWN AEROBIC AND ANAEROBIC 10CC EA  Final   Culture   Final           BLOOD CULTURE RECEIVED NO GROWTH TO DATE CULTURE WILL BE HELD FOR 5 DAYS BEFORE ISSUING A FINAL NEGATIVE REPORT Performed at [facility]    Report Status PENDING  Incomplete     Studies:  Recent x-ray studies have been reviewed in detail by the Attending Physician  Scheduled Meds:  Scheduled Meds: . antiseptic oral rinse  7 mL Mouth Rinse q12n4p  . azaTHIOprine  50 mg Oral Daily  . chlorhexidine  15 mL Mouth Rinse BID  . folic acid  1 mg  Oral Daily  . LORazepam  0-4 mg Oral Q6H   Followed by  . [START ON 04/13/2014] LORazepam  0-4 mg Oral Q12H  . multivitamin with minerals  1 tablet Oral Daily  . piperacillin-tazobactam (ZOSYN)  IV  3.375 g Intravenous 3 times per day  . potassium chloride  40 mEq Oral BID  . sodium chloride  3 mL Intravenous Q12H  . thiamine  100 mg Oral Daily    Time spent on care of this patient: 35 mins  Cherene Altes , MD   [facility] Office  [phone] Pager - Text Page per Amion as per below:  On-Call/Text Page:      [email or web address]      password [email or web address]  If 7PM-7AM, please contact night-coverage [email or web address] Password [facility] 04/11/2014, 3:34 PM   LOS: 2 days

## 2014-04-11 NOTE — Progress Notes (Signed)
Report called. Patient transferred in wheelchair by nurse tech with belongings, medication and chart to [phone]. Complaints of pain, receiving RN made aware. [facility] and cental telemetry notified of patient's transfer.   Lum Babe, RN
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
## 2014-04-12 DIAGNOSIS — F10231 Alcohol dependence with withdrawal delirium: Secondary | ICD-10-CM | POA: Diagnosis not present

## 2014-04-12 DIAGNOSIS — K50913 Crohn's disease, unspecified, with fistula: Secondary | ICD-10-CM

## 2014-04-12 DIAGNOSIS — F10931 Alcohol use, unspecified with withdrawal delirium: Secondary | ICD-10-CM | POA: Diagnosis not present

## 2014-04-12 LAB — COMPREHENSIVE METABOLIC PANEL
ALK PHOS: 109 U/L (ref 39–117)
ALT: 55 U/L — ABNORMAL HIGH (ref 0–53)
ANION GAP: 10 (ref 5–15)
AST: 67 U/L — ABNORMAL HIGH (ref 0–37)
Albumin: 3.2 g/dL — ABNORMAL LOW (ref 3.5–5.2)
BILIRUBIN TOTAL: 0.8 mg/dL (ref 0.3–1.2)
BUN: <5 mg/dL — ABNORMAL LOW (ref 6–23)
CALCIUM: 9 mg/dL (ref 8.4–10.5)
CO2: 21 mmol/L (ref 19–32)
Chloride: 107 mmol/L (ref 96–112)
Creatinine, Ser: 0.76 mg/dL (ref 0.50–1.35)
Glucose, Bld: 113 mg/dL — ABNORMAL HIGH (ref 70–99)
Potassium: 3.8 mmol/L (ref 3.5–5.1)
Sodium: 138 mmol/L (ref 135–145)
TOTAL PROTEIN: 6.6 g/dL (ref 6.0–8.3)

## 2014-04-12 LAB — CBC
HCT: 28.9 % — ABNORMAL LOW (ref 39.0–52.0)
HEMOGLOBIN: 8.2 g/dL — AB (ref 13.0–17.0)
MCH: 21 pg — AB (ref 26.0–34.0)
MCHC: 28.4 g/dL — ABNORMAL LOW (ref 30.0–36.0)
MCV: 74.1 fL — AB (ref 78.0–100.0)
PLATELETS: 480 10*3/uL — AB (ref 150–400)
RBC: 3.9 MIL/uL — ABNORMAL LOW (ref 4.22–5.81)
RDW: 22.3 % — AB (ref 11.5–15.5)
WBC: 9.8 10*3/uL (ref 4.0–10.5)

## 2014-04-12 LAB — HEPATITIS B SURFACE ANTIGEN: Hepatitis B Surface Ag: NEGATIVE

## 2014-04-12 MED ORDER — NICOTINE 21 MG/24HR TD PT24
21.0000 mg | MEDICATED_PATCH | Freq: Every day | TRANSDERMAL | Status: DC
  Administered 2014-04-12 – 2014-04-15 (×4): 21 mg via TRANSDERMAL
  Filled 2014-04-12 (×4): qty 1

## 2014-04-12 MED ORDER — CHLORDIAZEPOXIDE HCL 5 MG PO CAPS: 25.0000 mg | ORAL_CAPSULE | ORAL | Status: DC

## 2014-04-12 MED ORDER — MORPHINE SULFATE 4 MG/ML IJ SOLN
4.0000 mg | Freq: Once | INTRAMUSCULAR | Status: AC
  Administered 2014-04-12: 4 mg via INTRAVENOUS
  Filled 2014-04-12: qty 1

## 2014-04-12 MED ORDER — CHLORDIAZEPOXIDE HCL 25 MG PO CAPS
25.0000 mg | ORAL_CAPSULE | Freq: Three times a day (TID) | ORAL | Status: AC
  Administered 2014-04-13 (×3): 25 mg via ORAL
  Filled 2014-04-12 (×3): qty 1

## 2014-04-12 MED ORDER — CHLORDIAZEPOXIDE HCL 5 MG PO CAPS: 25.0000 mg | ORAL_CAPSULE | Freq: Four times a day (QID) | ORAL | Status: AC | PRN

## 2014-04-12 MED ORDER — CHLORDIAZEPOXIDE HCL 25 MG PO CAPS
25.0000 mg | ORAL_CAPSULE | Freq: Every day | ORAL | Status: AC
  Administered 2014-04-15: 25 mg via ORAL
  Filled 2014-04-12: qty 1

## 2014-04-12 MED ORDER — CHLORDIAZEPOXIDE HCL 5 MG PO CAPS: 25.0000 mg | ORAL_CAPSULE | Freq: Every day | ORAL | Status: DC

## 2014-04-12 MED ORDER — CHLORDIAZEPOXIDE HCL 25 MG PO CAPS
25.0000 mg | ORAL_CAPSULE | ORAL | Status: AC
  Administered 2014-04-14 (×2): 25 mg via ORAL
  Filled 2014-04-12 (×2): qty 1

## 2014-04-12 MED ORDER — LORAZEPAM 2 MG/ML IJ SOLN
2.0000 mg | Freq: Once | INTRAMUSCULAR | Status: AC
  Administered 2014-04-12: 2 mg via INTRAVENOUS
  Filled 2014-04-12: qty 1

## 2014-04-12 MED ORDER — CHLORDIAZEPOXIDE HCL 25 MG PO CAPS
25.0000 mg | ORAL_CAPSULE | Freq: Four times a day (QID) | ORAL | Status: AC
  Administered 2014-04-12 (×3): 25 mg via ORAL
  Filled 2014-04-12 (×3): qty 1

## 2014-04-12 MED ORDER — ONDANSETRON 4 MG PO TBDP
4.0000 mg | ORAL_TABLET | Freq: Four times a day (QID) | ORAL | Status: AC | PRN
Start: 2014-04-12 — End: 2014-04-15
  Filled 2014-04-12: qty 1

## 2014-04-12 MED ORDER — CHLORDIAZEPOXIDE HCL 5 MG PO CAPS: 25.0000 mg | ORAL_CAPSULE | Freq: Four times a day (QID) | ORAL | Status: DC

## 2014-04-12 MED ORDER — LORAZEPAM 2 MG/ML IJ SOLN
1.0000 mg | Freq: Once | INTRAMUSCULAR | Status: AC
  Administered 2014-04-12: 1 mg via INTRAVENOUS
  Filled 2014-04-12: qty 1

## 2014-04-12 MED ORDER — LOPERAMIDE HCL 2 MG PO CAPS: 2.0000 mg | ORAL_CAPSULE | ORAL | Status: AC | PRN

## 2014-04-12 MED ORDER — HYDROXYZINE HCL 25 MG PO TABS: 25.0000 mg | ORAL_TABLET | Freq: Four times a day (QID) | ORAL | Status: AC | PRN

## 2014-04-12 MED ORDER — CHLORDIAZEPOXIDE HCL 5 MG PO CAPS: 25.0000 mg | ORAL_CAPSULE | Freq: Three times a day (TID) | ORAL | Status: DC

## 2014-04-12 MED ORDER — CHLORDIAZEPOXIDE HCL 5 MG PO CAPS
50.0000 mg | ORAL_CAPSULE | Freq: Once | ORAL | Status: AC
  Administered 2014-04-12: 50 mg via ORAL
  Filled 2014-04-12: qty 10

## 2014-04-12 NOTE — Progress Notes (Signed)
Patient transferred to 5W06 from 3S. Oriented patient to room. Booklet given, armband verified, VSS. Will continue to monitor.

## 2014-04-12 NOTE — Progress Notes (Signed)
CARE MANAGEMENT NOTE 04/12/2014  Patient:  Benjamin Shelton, Benjamin Shelton   Account Number:  [id number]  Date Initiated:  04/12/2014  Documentation initiated by:  [facility]  Subjective/Objective Assessment:   seizures, ETOH     Action/Plan:   Anticipated DC Date:  04/13/2014   Anticipated DC Plan:  [place]  CM consult  [facility]      Choice offered to / List presented to:             Status of service:  Completed, signed off Medicare Important Message given?  NO (If response is "NO", the following Medicare IM given date fields will be blank) Date Medicare IM given:   Medicare IM given by:   Date Additional Medicare IM given:   Additional Medicare IM given by:    Discharge Disposition:  HOME/SELF CARE  Per UR Regulation:    If discussed at Long Length of Stay Meetings, dates discussed:    Comments:  04/12/2014 1440 NCM spoke to pt. States he has picked up his medications from [place] in the past. Pt last appt at [facility] was 11/2014. Call to arrange appt, and requested call back in am to arrange appt for next week. Per [place], pt had several no show appts in the system. Pt states he does not work. He has to borrow money from his family. NCM will continue to follow for dc needs. Jonnie Finner RN CCM Case Mgmt phone [phone]

## 2014-04-12 NOTE — Progress Notes (Addendum)
[place] APP TEAM Progress Note  Benjamin Shelton JAS:[id number] DOB: [birth year] DOA: 04/09/2014 PCP: No PCP Per Patient  Admit HPI / Brief Narrative: 34 y.o. male with h/o Crohn's disease who presented to the ED at [facility] after a seizure, with no prior hx of this. Was riding in car (as a passenger) when he had sudden onset of seizure activity which lasted several mins and resolved spontaneously.  Patient was initially confused but was alert and oriented after arrival to ED. Had mild abdominal pain before seizure (chronic abdominal pain from crohns) and developed fever and chills after arrival (100.4 on arrival).  Work up in ED demonstrated HGB of 5.5. He was admitted in September with chronic blood loss anemia / iron deficiency anemia from Crohn's and had a HGB on admission at that time of 7.0 which improved to 10.5 after transfusion. Iron studies done last May confirmed a fairly profound Iron deficiency anemia with Iron level of 11. Patient had not been on any form of Iron supplementation since his discharge in Sept.  Since admission patient has been evaluated by gastroenterology service. Given fistula rising Crohn's colitis Biologics would be the preferred medication but because of serious compliance issues GI will not prescribe at this time. They have documented he has been inconsistent with follow-up at the office. Currently colitis exacerbation is being managed with prednisone.   Patient also with significant alcohol intake regularly prior to admission. He was on a CIWA protocol before being transferred out of the stepdown unit. On the a.m. of 1/26 patient demonstrated florid alcohol withdrawal. Detox protocol was adjusted to use Librium along with prn Ativan. Patient expressed desire to leave AMA but was not oriented and did not have capacity to make this decision so urgent psychiatric consultation also placed on 1/26. Concern was patient may need to be involuntarily committed to complete  appropriate treatment. Sitter assigned to the patient's room.  HPI/Subjective: Patient very restless and tremulous. Requesting to be discharged home stating he does not wish to be in the hospital any longer and feels he has improved. Unfortunately when questioned patient was very confused. He reported it was the year 1996, the reason for hospitalization was a stroke. When questioned regarding his tremulousness he states he is always this way.  Assessment/Plan:  EtOH abuse/acute alcohol withdrawal Developed s/sx of mild to moderate withdrawal requiring CIWA to 4 hours prior to transfer out of stepdown- at time of transfer out of stepdown he was currently well controlled w/ no evidence of worsening sx -on the a.m. of 1/26 patient in florid alcohol withdrawal with confusion, tremors and hypertension.-Patient admitted to regular use of alcohol but was unable to quantify actual amounts even when given specific amount such as "how many 40 ounce beers per day do you drink" or "how many regular beers to you drink per day"-patient asking if he can leave AMA and given his mentation he lacks capacity to make this decision and would be unsafe to leave at this time-have adjusted detox protocol to primarily Librium with both prn Librium and Ativan dosing, we have discontinued scheduled Ativan-have requested urgent psychiatric consultation in the event patient needs to have IVC completed-if withdrawal symptoms worsen patient may need to transfer back to stepdown versus transfer to ICU for possible Precedex therapy  Seizure v/s syncopal convulsions  Single episode - no prior hx of same - CT head unrevealing - suspect this was actually an episode of orthostatic syncope w/ convulsions relate to severe anemia - no recurrence  in hospital -possibility seizure-like activity could've been related to evolving alcohol withdrawal as well  Fe deficiency anemia - chronic blood loss anemia  Hgb improved nicely w/ transfusion and  is holding steady at 8.3 - follow trend - Fe 18 w/ TIBC of 460 - loaded w/ full dose IV Fe 1/24  SIRS v/s sepsis (Temp 100.4 + HR 130) SIRS physiology has resolved   Hypokalemia  Replace and follow trend   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 and developing fistula near upper rectum but no abscess is noted -appreciate GI assistance-patient with documented history of poor compliance and therefore not a candidate to initiate biologic therapy; recommendation is to continue prednisone at current dosage; GI doctor also does not feel Imuran is indicated chronically but will continue for now noting apparent outpatient dose very low since the dosage for IBD is 2 to 2.5 grams per kilogram  Transaminitis  Essentially normalized w/ volume expansion   Lactic acidosis  Resolved  Code Status: FULL Family Communication: no family present at time of exam Disposition Plan: Remain inpatient-has been moved to [place] room with sitter due to evolving florid alcohol withdrawal-high risk for elopement and high risk to be transferred back to stepdown versus to ICU to treat alcohol withdrawal symptoms  Consultants: none  Procedures:  GI   Antibiotics: Zosyn 1/23 >  DVT prophylaxis: SCDs  Objective: Blood pressure 143/84, pulse 76, temperature 98.4 F (36.9 C), temperature source Oral, resp. rate 16, height 5\' 3"  (1.6 m), weight 124 lb 5.4 oz (56.4 kg), SpO2 100 %.  Intake/Output Summary (Last 24 hours) at 04/12/14 1214 Last data filed at 04/12/14 0931  Gross per 24 hour  Intake   1145 ml  Output      0 ml  Net   1145 ml   Exam: General: No acute respiratory distress - Alert but very tremulous and unsteady on his feet, confused but oriented to name and place only not to year, time of day, reason for hospitalization  Lungs: Clear to auscultation bilaterally without wheezes or crackles Cardiovascular: Regular  rate and rhythm without murmur gallop or rub normal S1 and S2 Abdomen: less tender today diffusely - no rebound, no mass - bs hypoactive  Extremities: No significant cyanosis, clubbing, or edema bilateral lower extremities  Data Reviewed: Basic Metabolic Panel:  Recent Labs Lab 04/09/14 1745 04/10/14 0600 04/11/14 0825 04/12/14 0730  NA 129* 136 136 138  K 3.6 3.5 2.9* 3.8  CL 99 102 99 107  CO2 14* 24 26 21   GLUCOSE 108* 76 105* 113*  BUN 8 <5* <5* <5*  CREATININE 0.93 0.96 0.78 0.76  CALCIUM 8.8 8.5 8.1* 9.0    Liver Function Tests:  Recent Labs Lab 04/09/14 1745 04/10/14 0600 04/11/14 0825 04/12/14 0730  AST 133* 71* 51* 67*  ALT 94* 72* 53 55*  ALKPHOS 138* 113 107 109  BILITOT 0.6 1.8* 1.2 0.8  PROT 7.5 6.4 6.3 6.6  ALBUMIN 4.1 3.4* 3.0* 3.2*   Coags:  Recent Labs Lab 04/11/14 0825  INR 1.09    Recent Labs Lab 04/11/14 0825  APTT 48*    CBC:  Recent Labs Lab 04/09/14 1745 04/10/14 0600 04/11/14 0825 04/12/14 0730  WBC 11.6* 9.2 10.8* 9.8  NEUTROABS 9.4*  --   --   --   HGB 5.5* 8.3* 8.3* 8.2*  HCT 21.2* 28.7* 28.0* 28.9*  MCV 69.1* 72.8* 73.7* 74.1*  PLT 610* 454* 409* 480*    Recent Results (from the past 240 hour(s))  MRSA PCR Screening     Status: None   Collection Time: 04/09/14  9:42 PM  Result Value Ref Range Status   MRSA by PCR NEGATIVE NEGATIVE Final    Comment:        The GeneXpert MRSA Assay (FDA approved for NASAL specimens only), is one component of a comprehensive MRSA colonization surveillance program. It is not intended to diagnose MRSA infection nor to guide or monitor treatment for MRSA infections.   Culture, blood (routine x 2)     Status: None (Preliminary result)   Collection Time: 04/09/14 10:22 PM  Result Value Ref Range Status   Specimen Description BLOOD RIGHT ANTECUBITAL  Final   Special Requests   Final    BOTTLES DRAWN AEROBIC AND ANAEROBIC 10CC BLUE [place] RED   Culture   Final           BLOOD  CULTURE RECEIVED NO GROWTH TO DATE CULTURE WILL BE HELD FOR 5 DAYS BEFORE ISSUING A FINAL NEGATIVE REPORT Performed at [facility]    Report Status PENDING  Incomplete  Culture, blood (routine x 2)     Status: None (Preliminary result)   Collection Time: 04/09/14 10:30 PM  Result Value Ref Range Status   Specimen Description BLOOD LEFT HAND  Final   Special Requests BOTTLES DRAWN AEROBIC AND ANAEROBIC 10CC EA  Final   Culture   Final           BLOOD CULTURE RECEIVED NO GROWTH TO DATE CULTURE WILL BE HELD FOR 5 DAYS BEFORE ISSUING A FINAL NEGATIVE REPORT Performed at [facility]    Report Status PENDING  Incomplete     Studies:  Recent x-ray studies have been reviewed in detail by the Attending Physician  Scheduled Meds:  Scheduled Meds: . antiseptic oral rinse  7 mL Mouth Rinse q12n4p  . azaTHIOprine  50 mg Oral Daily  . chlordiazePOXIDE  25 mg Oral QID   Followed by  . [START ON 04/13/2014] chlordiazePOXIDE  25 mg Oral TID   Followed by  . [START ON 04/14/2014] chlordiazePOXIDE  25 mg Oral BH-qamhs   Followed by  . [START ON 04/15/2014] chlordiazePOXIDE  25 mg Oral Daily  . chlorhexidine  15 mL Mouth Rinse BID  . folic acid  1 mg Oral Daily  . multivitamin with minerals  1 tablet Oral Daily  . nicotine  21 mg Transdermal Daily  . piperacillin-tazobactam (ZOSYN)  IV  3.375 g Intravenous 3 times per day  . predniSONE  20 mg Oral Q breakfast  . sodium chloride  3 mL Intravenous Q12H  . thiamine  100 mg Oral Daily    Time spent on care of this patient: 35 mins   ELLIS,ALLISON L. , ANP  [facility] Office  [phone] Pager - Text Page per Shea Evans as per below:  On-Call/Text Page:      [email or web address]      password [email or web address]  If 7PM-7AM, please contact night-coverage [email or web address] Password [email or web address] 04/12/2014, 12:14 PM   LOS: 3 days      I have directly reviewed the clinical findings, lab results and imaging studies. I have interviewed and examined  the patient and agree with the documentation and management as recorded by the Physician extender.  Patient has visible tremor , reported due to chronic steroid use, reported reason of admission is due to "stroke", later able to self correct to  states it was due to 'seizure", reported this is the first time he has seizure, reported no history of alcohol withdrawal problem, reported drink beer three times a week, smoke a pack a day, want to get out to smoke,  Explained the gravity of his situation, the need to have close follow up with GI and adhere to meds regimen, he expressed wish to compliant, agreed with case management consultation and help with meds supply/set up appointment, agreed to stay in the hospital at this moment.  Currently on ciwa protocol,  If patient do well , h/h stable, has discharge planning arranged, likely able to be discharged tomorrow.  Clemon Devaul M.D on 04/12/2014 at 12:52 PM  [facility] Office  [phone] Nicotine patch provided.

## 2014-04-12 NOTE — Progress Notes (Signed)
Spoke with MD regarding patient's Ativan orders and CIWA scores. CIWA this morning at 16. Per orders patient is to have 2 mg of Ativan. On-call coverage will assess whether this dose is still appropriate for patient. Will await further instruction.

## 2014-04-12 NOTE — Progress Notes (Signed)
Daily Rounding Note  04/12/2014, 9:20 AM  LOS: 3 days   SUBJECTIVE:       Still with back pain.  No nausea, tolerating solids.  BMs at baseline. Some Hypertension this AM to 142/96, no tachycardia.  Denies dizziness.   OBJECTIVE:         Vital signs in last 24 hours:    Temp:  [98.4 F (36.9 C)-99.4 F (37.4 C)] 99 F (37.2 C) (01/26 0622) Pulse Rate:  [71-97] 93 (01/26 0622) Resp:  [11-18] 18 (01/26 0622) BP: (124-142)/(79-96) 142/96 mmHg (01/26 0622) SpO2:  [98 %-100 %] 100 % (01/26 0622) Weight:  [124 lb 5.4 oz (56.4 kg)] 124 lb 5.4 oz (56.4 kg) (01/25 2049) Last BM Date: 04/11/14 Filed Weights   04/09/14 1738 04/09/14 2122 04/11/14 2049  Weight: 115 lb (52.164 kg) 113 lb 1.5 oz (51.3 kg) 124 lb 5.4 oz (56.4 kg)   General: frail, chronically ill looking   Heart: RRR Chest: clear bil Abdomen: soft, slight mid abdominal tenderness.  No guard or rebound.  BS hypoactive  Extremities: no CCE Neuro/Psych:   Tremulous.  Not oriented to date.    Intake/Output from previous day: 01/25 0701 - 01/26 0700 In: 1530 [P.O.:480; I.V.:1000; IV Piggyback:50] Out: -   Intake/Output this shift:    Lab Results:  Recent Labs  04/10/14 0600 04/11/14 0825 04/12/14 0730  WBC 9.2 10.8* 9.8  HGB 8.3* 8.3* 8.2*  HCT 28.7* 28.0* 28.9*  PLT 454* 409* 480*   BMET  Recent Labs  04/10/14 0600 04/11/14 0825 04/12/14 0730  NA 136 136 138  K 3.5 2.9* 3.8  CL 102 99 107  CO2 24 26 21   GLUCOSE 76 105* 113*  BUN <5* <5* <5*  CREATININE 0.96 0.78 0.76  CALCIUM 8.5 8.1* 9.0   LFT  Recent Labs  04/10/14 0600 04/11/14 0825 04/12/14 0730  PROT 6.4 6.3 6.6  ALBUMIN 3.4* 3.0* 3.2*  AST 71* 51* 67*  ALT 72* 53 55*  ALKPHOS 113 107 109  BILITOT 1.8* 1.2 0.8   PT/INR  Recent Labs  04/11/14 0825  LABPROT 14.2  INR 1.09   Hepatitis Panel  Recent Labs  04/11/14 1731  HEPBSAG NEGATIVE    Studies/Results: No  results found.   Scheduled Meds: . antiseptic oral rinse  7 mL Mouth Rinse q12n4p  . azaTHIOprine  50 mg Oral Daily  . chlordiazePOXIDE  25 mg Oral QID   Followed by  . [START ON 04/13/2014] chlordiazePOXIDE  25 mg Oral TID   Followed by  . [START ON 04/14/2014] chlordiazePOXIDE  25 mg Oral BH-qamhs   Followed by  . [START ON 04/15/2014] chlordiazePOXIDE  25 mg Oral Daily  . chlordiazePOXIDE  50 mg Oral Once  . chlorhexidine  15 mL Mouth Rinse BID  . folic acid  1 mg Oral Daily  . LORazepam  0-4 mg Oral Q6H   Followed by  . [START ON 04/13/2014] LORazepam  0-4 mg Oral Q12H  . multivitamin with minerals  1 tablet Oral Daily  . piperacillin-tazobactam (ZOSYN)  IV  3.375 g Intravenous 3 times per day  . potassium chloride  40 mEq Oral BID  . predniSONE  20 mg Oral Q breakfast  . sodium chloride  3 mL Intravenous Q12H  . thiamine  100 mg Oral Daily   Continuous Infusions: . sodium chloride 10 mL/hr at 04/11/14 1612   PRN Meds:.chlordiazePOXIDE, hydrOXYzine, loperamide, LORazepam **OR** LORazepam,  morphine injection, ondansetron   ASSESMENT:   * Crohn's disease. S/p colectomy with colo-colonic anastomosis (cecum to rectum anastamosis). Rectal ulcer on 11/2013 flex sig. CT scan now with rectal fistulae. Clinically stable diarrhea, abdominal pain in recent weeks. On Chronic Imuran. Prednisone 20 mg initiated. Reluctant to use biologics given hx non-compliance.  Hep B surface Ag negative, Hep C negative.   * Chronic transaminase elevation, suspect due to ETOH. Fatty liver on past ultrasound.  ? Current ETOH withdrawal.   * Seizure, new onset. CT head negative for cause. ? Due to ETOH  * Chronic abdominal pain. Stable.   * Chronic anemia, iron deficient. Not taking iron supplements. S/p PRBC x 2. FOBT +. Might benefit from parenteral iron infusion in addition to po Iron.     PLAN   *  Continue the prednisone.  Continue Imuran for now, though consider stopping this if  he fails to follow up for MD visits (PMD or GI).    Azucena Freed  04/12/2014, 9:20 AM Pager: [phone]   ________________________________________________________________________  [facility] GI MD note:  I personally examined the patient, reviewed the data and agree with the assessment and plan described above.  He has fistulizing Crohn's colitis, biologics (such as remicade) are indicated however with his serious compliance issues I am not going to prescribe that now. He will have to prove some sort of follow up in our office, we will set him up with appt and make sure he is well aware of that prior to his discharge. Would keep him on prednisone 20mg  once daily for now.  For same reason, very poor out paitient follow up, I don't think he should be on imuran.  The dose he has been taking (50mg  daily) is lower than is usual for IBD anyway.  Imuran for IBD is dosed 2 to 2.5mg /kg.   Owens Loffler, MD [facility] Gastroenterology Pager [phone]

## 2014-04-12 NOTE — Progress Notes (Signed)
[facility] paperwork faxed to [place].  Magistrate made aware of incoming fax.

## 2014-04-12 NOTE — Consult Note (Signed)
[facility] Consult   Reason for Consult:  Alcohol abuse and questionable withdrawal seizure Referring Physician:  Dr. Erlinda Hong  Patient Identification: Benjamin Shelton MRN:  [id number] Principal Diagnosis: Alcohol withdrawal and status post seizure Diagnosis:   Patient Active Problem List   Diagnosis Date Noted  . Alcohol withdrawal delirium [F10.231] 04/12/2014  . Seizure [R56.9] 04/09/2014  . SIRS (systemic inflammatory response syndrome) [A41.9] 04/09/2014  . Abdominal pain [R10.9] 04/09/2014  . Severe malnutrition [E41] 11/26/2013  . Crohn disease [K50.90] 11/25/2013  . Microcytic hypochromic anemia [D50.9] 11/25/2013  . Exacerbation of Crohn's disease [K50.90] 11/25/2013  . Alcohol abuse [F10.10] 11/25/2013  . Tobacco abuse [Z72.0] 11/25/2013  . Inflammatory bowel diseases (IBD) [K63.89] 10/21/2013  . Abdominal pain, other specified site [R10.84] 08/02/2013  . Iron deficiency anemia [D50.9] 08/02/2013  . IBD (inflammatory bowel disease) [K63.89] 07/29/2013    Total Time spent with patient: 45 minutes  Subjective:   Benjamin Shelton is a 34 y.o. male patient admitted with status post seizure and alcohol withdrawal symptoms.  HPI:  Benjamin Shelton is a 34 years old young male seen and chart reviewed for psychiatric consultation and evaluation. Patient was admitted for status post seizure and has multiple medical problems as listed above. Patient reported he never had a seizure in the past but endorses drinking alcohol 2 beers 3 times a week along with his friend for the last 6 months. Patient also reported he has been disabled because of multiple medical problems and unemployed for the last 2 years and failed to receive disability benefits from the [employer], site. Patient has been receiving food stamps and sheltered by her niece. Patient seems to be minimizing his amount of drinking and also denies previous history of alcohol detox treatment and  rehabilitation services. Patient also reported is not interested in receiving alcohol/substance abuse rehabilitation once medically stable. Patient denied current symptoms of alcohol withdrawal, craving and denies symptoms of depression, anxiety, suicidal or homicidal ideation, intention or plans. Agree with the involuntary commitment as patient has limited knowledge about withdrawal symptoms and consequences without treatment. Patient will be referred to the outpatient psychiatric services and substance abuse treatment and medically stable.  Medical history: Patient very restless and tremulous. Requesting to be discharged home stating he does not wish to be in the hospital any longer and feels he has improved. Unfortunately when questioned patient was very confused. He reported it was the year 1996, the reason for hospitalization was a stroke. When questioned regarding his tremulousness he states he is always this way.  HPI Elements:   Location:  Substance abuse. Quality:  Fair. Severity:  Status post seizure. Timing:  Questionable alcohol withdrawal. Duration:  3 days. Context:  Multiple psychosocial stresses and medical conditions and financial difficulties..  Past Medical History:  Past Medical History  Diagnosis Date  . Crohn's disease dx'd 29  . Rectal ulcer     Past Surgical History  Procedure Laterality Date  . Hernia repair      "stomach"  . Colostomy    . Partial colectomy    . Colostomy reversal    . Flexible sigmoidoscopy N/A 11/27/2013    Procedure: FLEXIBLE SIGMOIDOSCOPY;  Surgeon: Beryle Beams, MD;  Location: [facility];  Service: Endoscopy;  Laterality: N/A;   Family History:  Family History  Problem Relation Age of Onset  . Cancer Mother 27    Breast   Social History:  History  Alcohol Use  . 1.2 oz/week  . 2 Shots  of liquor per week     History  Drug Use No    History   Social History  . Marital Status: Single    Spouse Name: N/A    Number of  Children: N/A  . Years of Education: N/A   Social History Main Topics  . Smoking status: Current Every Day Smoker -- 0.25 packs/day for 23 years    Types: Cigarettes  . Smokeless tobacco: Never Used  . Alcohol Use: 1.2 oz/week    2 Shots of liquor per week  . Drug Use: No  . Sexual Activity: Yes   Other Topics Concern  . None   Social History Narrative   Additional Social History:                          Allergies:   Allergies  Allergen Reactions  . Ibuprofen Itching  . Tramadol Other (See Comments)    irritates chrons     Vitals: Blood pressure 143/84, pulse 76, temperature 98.4 F (36.9 C), temperature source Oral, resp. rate 16, height 5\' 3"  (1.6 m), weight 56.4 kg (124 lb 5.4 oz), SpO2 100 %.  Risk to Self: Is patient at risk for suicide?: No Risk to Others:   Prior Inpatient Therapy:   Prior Outpatient Therapy:    Current Facility-Administered Medications  Medication Dose Route Frequency Provider Last Rate Last Dose  . 0.9 %  sodium chloride infusion   Intravenous Continuous Cherene Altes, MD 10 mL/hr at 04/11/14 1612    . antiseptic oral rinse (CPC / CETYLPYRIDINIUM CHLORIDE 0.05%) solution 7 mL  7 mL Mouth Rinse q12n4p Etta Quill, DO   7 mL at 04/11/14 1600  . azaTHIOprine (IMURAN) tablet 50 mg  50 mg Oral Daily Etta Quill, DO   50 mg at 04/12/14 7062  . chlordiazePOXIDE (LIBRIUM) capsule 25 mg  25 mg Oral Q6H PRN Samella Parr, NP      . chlordiazePOXIDE (LIBRIUM) capsule 25 mg  25 mg Oral QID Florencia Reasons, MD   25 mg at 04/12/14 1358   Followed by  . [START ON 04/13/2014] chlordiazePOXIDE (LIBRIUM) capsule 25 mg  25 mg Oral TID Florencia Reasons, MD       Followed by  . [START ON 04/14/2014] chlordiazePOXIDE (LIBRIUM) capsule 25 mg  25 mg Oral BH-qamhs Florencia Reasons, MD       Followed by  . [START ON 04/15/2014] chlordiazePOXIDE (LIBRIUM) capsule 25 mg  25 mg Oral Daily Florencia Reasons, MD      . chlorhexidine (PERIDEX) 0.12 % solution 15 mL  15 mL Mouth Rinse BID  Etta Quill, DO   15 mL at 04/11/14 1958  . folic acid (FOLVITE) tablet 1 mg  1 mg Oral Daily Etta Quill, DO   1 mg at 04/12/14 3762  . hydrOXYzine (ATARAX/VISTARIL) tablet 25 mg  25 mg Oral Q6H PRN Samella Parr, NP      . loperamide (IMODIUM) capsule 2-4 mg  2-4 mg Oral PRN Samella Parr, NP      . LORazepam (ATIVAN) tablet 1 mg  1 mg Oral Q6H PRN Jeryl Columbia, NP       Or  . LORazepam (ATIVAN) injection 1 mg  1 mg Intravenous Q6H PRN Jeryl Columbia, NP      . morphine 2 MG/ML injection 2-4 mg  2-4 mg Intravenous Q4H PRN Cherene Altes, MD   4 mg at  04/12/14 1605  . multivitamin with minerals tablet 1 tablet  1 tablet Oral Daily Etta Quill, DO   1 tablet at 04/12/14 [phone]  . nicotine (NICODERM CQ - dosed in mg/24 hours) patch 21 mg  21 mg Transdermal Daily Florencia Reasons, MD   21 mg at 04/12/14 1205  . ondansetron (ZOFRAN-ODT) disintegrating tablet 4 mg  4 mg Oral Q6H PRN Samella Parr, NP      . piperacillin-tazobactam (ZOSYN) IVPB 3.375 g  3.375 g Intravenous 3 times per day Georgina Peer, RPH   3.375 g at 04/12/14 1358  . predniSONE (DELTASONE) tablet 20 mg  20 mg Oral Q breakfast Milus Banister, MD   20 mg at 04/12/14 0810  . sodium chloride 0.9 % injection 3 mL  3 mL Intravenous Q12H Etta Quill, DO   3 mL at 04/11/14 2317  . thiamine (VITAMIN B-1) tablet 100 mg  100 mg Oral Daily Etta Quill, DO   100 mg at 04/12/14 3435    Musculoskeletal: Strength & Muscle Tone: within Benjamin limits Gait & Station: Benjamin Patient leans: N/A  Psychiatric Specialty Exam: Physical Exam as per history and physical   ROS, calm and cooperative   Blood pressure 143/84, pulse 76, temperature 98.4 F (36.9 C), temperature source Oral, resp. rate 16, height 5\' 3"  (1.6 m), weight 56.4 kg (124 lb 5.4 oz), SpO2 100 %.Body mass index is 22.03 kg/(m^2).  General Appearance: Casual  Eye Contact::  Good  Speech:  Clear and Coherent  Volume:  Decreased  Mood:  Anxious   Affect:  Appropriate and Congruent  Thought Process:  Coherent and Goal Directed  Orientation:  Full (Time, Place, and Person)  Thought Content:  WDL  Suicidal Thoughts:  No  Homicidal Thoughts:  No  Memory:  Immediate;   Good Recent;   Good  Judgement:  Impaired  Insight:  Lacking  Psychomotor Activity:  Decreased  Concentration:  Good  Recall:  Good  Fund of Knowledge:Good  Language: Good  Akathisia:  NA  Handed:  Right  AIMS (if indicated):     Assets:  Communication Skills Desire for Improvement Housing Intimacy Leisure Time Resilience Social Support Talents/Skills  ADL's:  Intact  Cognition: WNL  Sleep:      Medical Decision Making: Self-Limited or Minor (1), Review of Psycho-Social Stressors (1), Review or order clinical lab tests (1), Discuss test with performing physician (1), Established Problem, Worsening (2), Review or order medicine tests (1), Independent Review of image, tracing or specimen (2), Review of Medication Regimen & Side Effects (2) and Review of New Medication or Change in Dosage (2)  Treatment Plan Summary: Daily contact with patient to assess and evaluate symptoms and progress in treatment and Medication management  Plan:  Continue alcohol detox treatment and supportive therapy Again with involuntary commitment as patient has limited knowledge about alcohol withdrawal symptoms and treatment needs No evidence of imminent risk to self or others at present.   Patient does not meet criteria for psychiatric inpatient admission. Supportive therapy provided about ongoing stressors. Refer to the outpatient substance abuse treatment and medically stable   Disposition: Referred to the outpatient psychiatric services when medically stable. Psychiatry will continue to follow up as clinically required.  Rein Popov,JANARDHAHA R. 04/12/2014 5:13 PM

## 2014-04-12 NOTE — Progress Notes (Signed)
Psychiatrist has recently completed IP evaluation; agrees that pt meets criteria for IVC. Order for SW assistance placed but since after 5 pm spoke with ED SW. States need to have MD complete forms. Attending MD made aware and to contact SW to begin IVC process  Erin Hearing, ANP  I have directly reviewed the clinical findings, lab results and imaging studies. I have interviewed and examined the patient and agree with the documentation and management as recorded by the Physician extender.  Patient does have poor insight, intermittent agitation, likely going through alcohol withdrawal. Psych eval appreciated, psych agreed with ivc, ivc paper signed.  Kent Riendeau M.D on 04/12/2014 at 7:09 PM  [facility] Office  [phone]

## 2014-04-12 NOTE — Progress Notes (Signed)
Patient has been told multiple times when his PRN pain medication is available. Patient continues to ask multiple times in an hour. Patient became angry stating that he desired to leave AMA after being told he had to wait 30 more minutes for pain medication. On-call provider notified. One time order for additional pain medication was ordered. RN went to give additional medication to patient and had to call his name very loud and shake him to wake him up. Patient had unscrewed his IV, which was found to be laying on the floor near his bed along with his gown. He claimed he did not remember getting angry and saying he wanted to leave the hospital, and stated he did not know how his gown and IV line ended up on the floor. As soon as RN went out of the room to get new tubing and IVF patient got up, setting off the bed alarm. RN reminded patient that she was attempting to give his pain medication and that in order to do so new tubing needed to be set up. RN would be unable to complete this task if continued to set off bed alarm. Patient stated " I have to go help Earnestine". RN asked who Carlyle Basques was, patient replied it was his niece. RN asked patient if he knew where he was. Patient replied "yes, I'm in the hospital, I have to help my niece after I go home today." RN reminded patient that while he is in the hospital he needs to get back in bed so that his pain medication could be administered. Patient requires very frequent reminders about pain medication and bed alarm. He is very unsteady and has tremor. Will continue to monitor.

## 2014-04-12 NOTE — Progress Notes (Signed)
Patient notified by RN that he can not be out of bed without staff assistance. Educated patient that due to his recent seizure and unsteady gate he would be placed on bed alarm. Will continue to monitor.

## 2014-04-13 DIAGNOSIS — F10239 Alcohol dependence with withdrawal, unspecified: Secondary | ICD-10-CM

## 2014-04-13 DIAGNOSIS — R1084 Generalized abdominal pain: Secondary | ICD-10-CM

## 2014-04-13 DIAGNOSIS — F10231 Alcohol dependence with withdrawal delirium: Secondary | ICD-10-CM

## 2014-04-13 LAB — COMPREHENSIVE METABOLIC PANEL
ALT: 61 U/L — ABNORMAL HIGH (ref 0–53)
AST: 89 U/L — ABNORMAL HIGH (ref 0–37)
Albumin: 3 g/dL — ABNORMAL LOW (ref 3.5–5.2)
Alkaline Phosphatase: 101 U/L (ref 39–117)
Anion gap: 9 (ref 5–15)
BUN: <5 mg/dL — ABNORMAL LOW (ref 6–23)
CO2: 22 mmol/L (ref 19–32)
Calcium: 8.7 mg/dL (ref 8.4–10.5)
Chloride: 106 mmol/L (ref 96–112)
Creatinine, Ser: 0.88 mg/dL (ref 0.50–1.35)
GFR calc Af Amer: >90 mL/min (ref >90)
GFR calc non Af Amer: >90 mL/min (ref >90)
Glucose, Bld: 101 mg/dL — ABNORMAL HIGH (ref 70–99)
Potassium: 3.3 mmol/L — ABNORMAL LOW (ref 3.5–5.1)
Sodium: 137 mmol/L (ref 135–145)
Total Bilirubin: 0.6 mg/dL (ref 0.3–1.2)
Total Protein: 6.2 g/dL (ref 6.0–8.3)

## 2014-04-13 LAB — QUANTIFERON IN TUBE
QFT TB AG MINUS NIL VALUE: 0 IU/mL
QUANTIFERON MITOGEN VALUE: 6.7 IU/mL
QUANTIFERON TB AG VALUE: 0.02 IU/mL
QUANTIFERON TB GOLD: NEGATIVE
Quantiferon Nil Value: 0.02 IU/mL

## 2014-04-13 LAB — CBC
HCT: 30.2 % — ABNORMAL LOW (ref 39.0–52.0)
HEMOGLOBIN: 8.4 g/dL — AB (ref 13.0–17.0)
MCH: 21.4 pg — AB (ref 26.0–34.0)
MCHC: 27.8 g/dL — AB (ref 30.0–36.0)
MCV: 76.8 fL — ABNORMAL LOW (ref 78.0–100.0)
Platelets: 466 10*3/uL — ABNORMAL HIGH (ref 150–400)
RBC: 3.93 MIL/uL — AB (ref 4.22–5.81)
RDW: 24.4 % — ABNORMAL HIGH (ref 11.5–15.5)
WBC: 11.2 10*3/uL — ABNORMAL HIGH (ref 4.0–10.5)

## 2014-04-13 LAB — QUANTIFERON TB GOLD ASSAY (BLOOD)

## 2014-04-13 LAB — HEPATITIS B SURFACE ANTIBODY, QUANTITATIVE: Hep B S AB Quant (Post): <3.1 m[IU]/mL — ABNORMAL LOW

## 2014-04-13 LAB — CK: Total CK: 2967 U/L — ABNORMAL HIGH (ref 7–232)

## 2014-04-13 NOTE — Progress Notes (Signed)
[facility] APP TEAM Progress Note  Benjamin Shelton JJK:[id number] DOB: 12-Apr-1980 DOA: 04/09/2014 PCP: No PCP Per Patient  Admit HPI / Brief Narrative: 34 y.o. male with h/o Crohn's disease who presented to the ED at [facility] after a seizure, with no prior hx of this. Was riding in car (as a passenger) when he had sudden onset of seizure activity which lasted several mins and resolved spontaneously.  Patient was initially confused but was alert and oriented after arrival to ED. Had mild abdominal pain before seizure (chronic abdominal pain from crohns) and developed fever and chills after arrival (100.4 on arrival).  Work up in ED demonstrated HGB of 5.5. He was admitted in September with chronic blood loss anemia / iron deficiency anemia from Crohn's and had a HGB on admission at that time of 7.0 which improved to 10.5 after transfusion. Iron studies done last May confirmed a fairly profound Iron deficiency anemia with Iron level of 11. Patient had not been on any form of Iron supplementation since his discharge in Sept.  Since admission patient has been evaluated by gastroenterology service. Given fistula rising Crohn's colitis Biologics would be the preferred medication but because of serious compliance issues GI will not prescribe at this time. They have documented he has been inconsistent with follow-up at the office. Currently colitis exacerbation is being managed with prednisone.   Patient also with significant alcohol intake regularly prior to admission. He was on a CIWA protocol before being transferred out of the stepdown unit. On the a.m. of 1/26 patient demonstrated florid alcohol withdrawal. Detox protocol was adjusted to use Librium along with prn Ativan. Patient expressed desire to leave AMA but was not oriented and did not have capacity to make this decision so urgent psychiatric consultation also placed on 1/26. Concern was patient may need to be involuntarily committed to complete  appropriate treatment. Sitter assigned to the patient's room.  HPI/Subjective: Remain slightly tremulous but much improved over yesterday; now able to accurately describe symptoms: Endorses low back and rectal discomfort with sitting, states no blood in BM and is having formed brown stools  Assessment/Plan: EtOH abuse/acute alcohol withdrawal -Developed s/sx of mild to moderate withdrawal requiring CIWA to 4 hours prior to transfer out of stepdown- at time of transfer out of stepdown he was currently well controlled w/ no evidence of worsening sx  -on the a.m. of 1/26 patient in florid alcohol withdrawal with confusion, tremors and hypertension. -Patient admitted to regular use of alcohol but was unable to quantify actual amounts even when given specific amount such as "how many 40 ounce beers per day do you drink" or "how many regular beers to you drink per day"-patient asking if he can leave AMA and given his mentation was determined he lacked capacity to make this decision and would be unsafe to leave at this time -1/26 adjusted detox protocol to Librium with both prn Librium and Ativan  -Appreciate psychiatric consultation  -CIWA scores markedly improved noted up to 16 on 1/26 and today down to 3  Seizure v/s syncopal convulsions  -Single episode without no prior hx of same  - CT head unrevealing  - suspect this was actually an episode of orthostatic syncope w/ convulsions relate to severe anemia  - no recurrence in hospital  -possibility seizure-like activity could've been related to evolving alcohol withdrawal as well although today patient reports had one half beer prior to coming into the hospital  Fe deficiency anemia - chronic blood loss anemia  Hgb improved nicely w/ transfusion and is holding steady at 8.3  - follow trend  - Fe 18 w/ TIBC of 460 - loaded w/ full dose IV Fe 1/24  SIRS v/s sepsis (Temp 100.4 + HR 130) SIRS physiology has resolved   Hypokalemia  Replace and  follow trend   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 and developing fistula near upper rectum but no abscess is noted  -appreciate GI assistance -patient with documented history of poor compliance and therefore not a candidate to initiate biologic therapy; recommendation is to continue prednisone at current dosage; GI doctor also does not feel Imuran is indicated chronically but will continue for now noting apparent outpatient dose very low since the dosage for IBD is 2 to 2.5 grams per kilogram -Slowly improving/continue antibiotics -May need to repeat CT pelvis before discharge since reported as evolving fistula and may be developing abscess given ongoing pain  Transaminitis  Essentially normalized w/ volume expansion   Lactic acidosis  Resolved  Code Status: FULL Family Communication: no family present at time of exam Disposition Plan: Inpatient to complete detoxification; continue IV antibiotics-reluctant to discharge quickly given patient's history of noncompliance and suspect he would not complete antibiotics therapy as an outpatient would lead to another hospitalization  Consultants: none  Procedures: [facility]   Antibiotics: Zosyn 1/23 >  DVT prophylaxis: SCDs  Objective: Blood pressure 127/82, pulse 98, temperature 97.5 F (36.4 C), temperature source Oral, resp. rate 16, height 5\' 3"  (1.6 m), weight 124 lb 5.4 oz (56.4 kg), SpO2 100 %.  Intake/Output Summary (Last 24 hours) at 04/13/14 1407 Last data filed at 04/13/14 0900  Gross per 24 hour  Intake    792 ml  Output      2 ml  Net    790 ml   Exam: General: No acute respiratory distress -alert and mildly tremulous today, anus confused but much more appropriate when compared to yesterday Lungs: Clear to auscultation bilaterally without wheezes or crackles Cardiovascular: Regular rate and rhythm without murmur gallop or rub  normal S1 and S2 Abdomen: less tender today diffusely - no rebound, no mass - bs hypoactive  Extremities: No significant cyanosis, clubbing, or edema bilateral lower extremities  Data Reviewed: Basic Metabolic Panel:  Recent Labs Lab 04/09/14 1745 04/10/14 0600 04/11/14 0825 04/12/14 0730 04/13/14 0657  NA 129* 136 136 138 137  K 3.6 3.5 2.9* 3.8 3.3*  CL 99 102 99 107 106  CO2 14* 24 26 21 22   GLUCOSE 108* 76 105* 113* 101*  BUN 8 <5* <5* <5* <5*  CREATININE 0.93 0.96 0.78 0.76 0.88  CALCIUM 8.8 8.5 8.1* 9.0 8.7    Liver Function Tests:  Recent Labs Lab 04/09/14 1745 04/10/14 0600 04/11/14 0825 04/12/14 0730 04/13/14 0657  AST 133* 71* 51* 67* 89*  ALT 94* 72* 53 55* 61*  ALKPHOS 138* 113 107 109 101  BILITOT 0.6 1.8* 1.2 0.8 0.6  PROT 7.5 6.4 6.3 6.6 6.2  ALBUMIN 4.1 3.4* 3.0* 3.2* 3.0*   Coags:  Recent Labs Lab 04/11/14 0825  INR 1.09    Recent Labs Lab 04/11/14 0825  APTT 48*    CBC:  Recent Labs Lab 04/09/14 1745 04/10/14 0600 04/11/14 0825 04/12/14 0730 04/13/14 0657  WBC 11.6* 9.2 10.8* 9.8 11.2*  NEUTROABS 9.4*  --   --   --   --   HGB 5.5* 8.3* 8.3*  8.2* 8.4*  HCT 21.2* 28.7* 28.0* 28.9* 30.2*  MCV 69.1* 72.8* 73.7* 74.1* 76.8*  PLT 610* 454* 409* 480* 466*    Recent Results (from the past 240 hour(s))  MRSA PCR Screening     Status: None   Collection Time: 04/09/14  9:42 PM  Result Value Ref Range Status   MRSA by PCR NEGATIVE NEGATIVE Final    Comment:        The GeneXpert MRSA Assay (FDA approved for NASAL specimens only), is one component of a comprehensive MRSA colonization surveillance program. It is not intended to diagnose MRSA infection nor to guide or monitor treatment for MRSA infections.   Culture, blood (routine x 2)     Status: None (Preliminary result)   Collection Time: 04/09/14 10:22 PM  Result Value Ref Range Status   Specimen Description BLOOD RIGHT ANTECUBITAL  Final   Special Requests   Final     BOTTLES DRAWN AEROBIC AND ANAEROBIC 10CC BLUE [place] RED   Culture   Final           BLOOD CULTURE RECEIVED NO GROWTH TO DATE CULTURE WILL BE HELD FOR 5 DAYS BEFORE ISSUING A FINAL NEGATIVE REPORT Performed at [facility]    Report Status PENDING  Incomplete  Culture, blood (routine x 2)     Status: None (Preliminary result)   Collection Time: 04/09/14 10:30 PM  Result Value Ref Range Status   Specimen Description BLOOD LEFT HAND  Final   Special Requests BOTTLES DRAWN AEROBIC AND ANAEROBIC 10CC EA  Final   Culture   Final           BLOOD CULTURE RECEIVED NO GROWTH TO DATE CULTURE WILL BE HELD FOR 5 DAYS BEFORE ISSUING A FINAL NEGATIVE REPORT Performed at [facility]    Report Status PENDING  Incomplete     Studies:  Recent x-ray studies have been reviewed in detail by the Attending Physician  Scheduled Meds:  Scheduled Meds: . antiseptic oral rinse  7 mL Mouth Rinse q12n4p  . azaTHIOprine  50 mg Oral Daily  . chlordiazePOXIDE  25 mg Oral TID   Followed by  . [START ON 04/14/2014] chlordiazePOXIDE  25 mg Oral BH-qamhs   Followed by  . [START ON 04/15/2014] chlordiazePOXIDE  25 mg Oral Daily  . chlorhexidine  15 mL Mouth Rinse BID  . folic acid  1 mg Oral Daily  . multivitamin with minerals  1 tablet Oral Daily  . nicotine  21 mg Transdermal Daily  . piperacillin-tazobactam (ZOSYN)  IV  3.375 g Intravenous 3 times per day  . predniSONE  20 mg Oral Q breakfast  . sodium chloride  3 mL Intravenous Q12H  . thiamine  100 mg Oral Daily    Time spent on care of this patient: 35 mins   Kenzie Thoreson L. , ANP  [facility] Office  [phone] Pager - Text Page per Shea Evans as per below:  On-Call/Text Page:      [email or web address]      password [email or web address]  If 7PM-7AM, please contact night-coverage [email or web address] Password [email or web address] 04/13/2014, 2:07 PM   LOS: 4 days

## 2014-04-13 NOTE — Consult Note (Signed)
Psychiatry Consult follow Up notes  Reason for Consult:  Alcohol abuse and questionable withdrawal seizure Referring Physician:  Dr. Erlinda Hong  Patient Identification: Benjamin Shelton MRN:  [id number] Principal Diagnosis: Alcohol withdrawal and status post seizure Diagnosis:   Patient Active Problem List   Diagnosis Date Noted  . Alcohol withdrawal delirium [F10.231] 04/12/2014  . Seizure [R56.9] 04/09/2014  . SIRS (systemic inflammatory response syndrome) [A41.9] 04/09/2014  . Abdominal pain [R10.9] 04/09/2014  . Severe malnutrition [E41] 11/26/2013  . Crohn disease [K50.90] 11/25/2013  . Microcytic hypochromic anemia [D50.9] 11/25/2013  . Exacerbation of Crohn's disease [K50.90] 11/25/2013  . Alcohol abuse [F10.10] 11/25/2013  . Tobacco abuse [Z72.0] 11/25/2013  . Inflammatory bowel diseases (IBD) [K63.89] 10/21/2013  . Abdominal pain, other specified site [R10.84] 08/02/2013  . Iron deficiency anemia [D50.9] 08/02/2013  . IBD (inflammatory bowel disease) [K63.89] 07/29/2013    Total Time spent with patient: 30 minutes  Subjective:   Benjamin Shelton is a 34 y.o. male patient admitted with status post seizure and alcohol withdrawal symptoms.  HPI:  Benjamin Shelton is a 34 years old young male seen and chart reviewed for psychiatric consultation and evaluation. Patient was admitted for status post seizure and has multiple medical problems as listed above. Patient reported he never had a seizure in the past but endorses drinking alcohol 2 beers 3 times a week along with his friend for the last 6 months. Patient also reported he has been disabled because of multiple medical problems and unemployed for the last 2 years and failed to receive disability benefits from the [employer], site. Patient has been receiving food stamps and sheltered by her niece. Patient seems to be minimizing his amount of drinking and also denies previous history of alcohol detox treatment and  rehabilitation services. Patient also reported is not interested in receiving alcohol/substance abuse rehabilitation once medically stable. Patient denied current symptoms of alcohol withdrawal, craving and denies symptoms of depression, anxiety, suicidal or homicidal ideation, intention or plans. Agree with the involuntary commitment as patient has limited knowledge about withdrawal symptoms and consequences without treatment. Patient will be referred to the outpatient psychiatric services and substance abuse treatment and medically stable.  Interval history: Patient is calm and cooperative. He has been walking on hall way with his IV pole. He has no complaints today. Requesting to be discharged home stating he does not wish to be in the hospital any longer and feels he has improved. He has no symptoms of depression, anxiety and craving for alcohol. Reportedly he was incarcerated for 4 1/2 years in [place] for stealing along with an associated in a pharmacy.    Past Medical History:  Past Medical History  Diagnosis Date  . Crohn's disease dx'd 76  . Rectal ulcer     Past Surgical History  Procedure Laterality Date  . Hernia repair      "stomach"  . Colostomy    . Partial colectomy    . Colostomy reversal    . Flexible sigmoidoscopy N/A 11/27/2013    Procedure: FLEXIBLE SIGMOIDOSCOPY;  Surgeon: Beryle Beams, MD;  Location: [facility];  Service: Endoscopy;  Laterality: N/A;   Family History:  Family History  Problem Relation Age of Onset  . Cancer Mother 70    Breast   Social History:  History  Alcohol Use  . 1.2 oz/week  . 2 Shots of liquor per week     History  Drug Use No    History   Social History  .  Marital Status: Single    Spouse Name: N/A    Number of Children: N/A  . Years of Education: N/A   Social History Main Topics  . Smoking status: Current Every Day Smoker -- 0.25 packs/day for 23 years    Types: Cigarettes  . Smokeless tobacco: Never Used  .  Alcohol Use: 1.2 oz/week    2 Shots of liquor per week  . Drug Use: No  . Sexual Activity: Yes   Other Topics Concern  . None   Social History Narrative   Additional Social History:                          Allergies:   Allergies  Allergen Reactions  . Ibuprofen Itching  . Tramadol Other (See Comments)    irritates chrons     Vitals: Blood pressure 127/82, pulse 98, temperature 97.5 F (36.4 C), temperature source Oral, resp. rate 16, height 5\' 3"  (1.6 m), weight 56.4 kg (124 lb 5.4 oz), SpO2 100 %.  Risk to Self: Is patient at risk for suicide?: No Risk to Others:   Prior Inpatient Therapy:   Prior Outpatient Therapy:    Current Facility-Administered Medications  Medication Dose Route Frequency Provider Last Rate Last Dose  . 0.9 %  sodium chloride infusion   Intravenous Continuous Cherene Altes, MD 10 mL/hr at 04/11/14 1612    . antiseptic oral rinse (CPC / CETYLPYRIDINIUM CHLORIDE 0.05%) solution 7 mL  7 mL Mouth Rinse q12n4p Etta Quill, DO   7 mL at 04/13/14 1200  . azaTHIOprine (IMURAN) tablet 50 mg  50 mg Oral Daily Etta Quill, DO   50 mg at 04/13/14 1042  . chlordiazePOXIDE (LIBRIUM) capsule 25 mg  25 mg Oral Q6H PRN Samella Parr, NP      . chlordiazePOXIDE (LIBRIUM) capsule 25 mg  25 mg Oral TID Florencia Reasons, MD   25 mg at 04/13/14 1042   Followed by  . [START ON 04/14/2014] chlordiazePOXIDE (LIBRIUM) capsule 25 mg  25 mg Oral BH-qamhs Florencia Reasons, MD       Followed by  . [START ON 04/15/2014] chlordiazePOXIDE (LIBRIUM) capsule 25 mg  25 mg Oral Daily Florencia Reasons, MD      . chlorhexidine (PERIDEX) 0.12 % solution 15 mL  15 mL Mouth Rinse BID Etta Quill, DO   15 mL at 04/13/14 8242  . folic acid (FOLVITE) tablet 1 mg  1 mg Oral Daily Etta Quill, DO   1 mg at 04/13/14 1042  . hydrOXYzine (ATARAX/VISTARIL) tablet 25 mg  25 mg Oral Q6H PRN Samella Parr, NP      . loperamide (IMODIUM) capsule 2-4 mg  2-4 mg Oral PRN Samella Parr, NP      .  LORazepam (ATIVAN) tablet 1 mg  1 mg Oral Q6H PRN Jeryl Columbia, NP       Or  . LORazepam (ATIVAN) injection 1 mg  1 mg Intravenous Q6H PRN Rhetta Mura Schorr, NP      . morphine 2 MG/ML injection 2-4 mg  2-4 mg Intravenous Q4H PRN Cherene Altes, MD   4 mg at 04/13/14 1250  . multivitamin with minerals tablet 1 tablet  1 tablet Oral Daily Etta Quill, DO   1 tablet at 04/13/14 1042  . nicotine (NICODERM CQ - dosed in mg/24 hours) patch 21 mg  21 mg Transdermal Daily Florencia Reasons, MD  21 mg at 04/13/14 1044  . ondansetron (ZOFRAN-ODT) disintegrating tablet 4 mg  4 mg Oral Q6H PRN Samella Parr, NP      . piperacillin-tazobactam (ZOSYN) IVPB 3.375 g  3.375 g Intravenous 3 times per day Georgina Peer, RPH   3.375 g at 04/13/14 7035  . predniSONE (DELTASONE) tablet 20 mg  20 mg Oral Q breakfast Milus Banister, MD   20 mg at 04/13/14 0093  . sodium chloride 0.9 % injection 3 mL  3 mL Intravenous Q12H Etta Quill, DO   3 mL at 04/13/14 1000  . thiamine (VITAMIN B-1) tablet 100 mg  100 mg Oral Daily Etta Quill, DO   100 mg at 04/13/14 1042    Musculoskeletal: Strength & Muscle Tone: within Benjamin limits Gait & Station: Benjamin Patient leans: N/A  Psychiatric Specialty Exam: Physical Exam as per history and physical   ROS, calm and cooperative   Blood pressure 127/82, pulse 98, temperature 97.5 F (36.4 C), temperature source Oral, resp. rate 16, height 5\' 3"  (1.6 m), weight 56.4 kg (124 lb 5.4 oz), SpO2 100 %.Body mass index is 22.03 kg/(m^2).  General Appearance: Casual  Eye Contact::  Good  Speech:  Clear and Coherent  Volume:  Decreased  Mood:  Anxious  Affect:  Appropriate and Congruent  Thought Process:  Coherent and Goal Directed  Orientation:  Full (Time, Place, and Person)  Thought Content:  WDL  Suicidal Thoughts:  No  Homicidal Thoughts:  No  Memory:  Immediate;   Good Recent;   Good  Judgement:  Impaired  Insight:  Lacking  Psychomotor Activity:   Decreased  Concentration:  Good  Recall:  Good  Fund of Knowledge:Good  Language: Good  Akathisia:  NA  Handed:  Right  AIMS (if indicated):     Assets:  Communication Skills Desire for Improvement Housing Intimacy Leisure Time Resilience Social Support Talents/Skills  ADL's:  Intact  Cognition: WNL  Sleep:      Medical Decision Making: Self-Limited or Minor (1), Review of Psycho-Social Stressors (1), Review or order clinical lab tests (1), Discuss test with performing physician (1), Established Problem, Worsening (2), Review or order medicine tests (1), Independent Review of image, tracing or specimen (2), Review of Medication Regimen & Side Effects (2) and Review of New Medication or Change in Dosage (2)  Treatment Plan Summary: Daily contact with patient to assess and evaluate symptoms and progress in treatment and Medication management  Plan:  Case discussed with Dr. Dyann Kief Continue alcohol detox treatment and supportive therapy Patient has limited knowledge about alcohol withdrawal symptoms and treatment needs No evidence of imminent risk to self or others at present.   Patient does not meet criteria for psychiatric inpatient admission. Supportive therapy provided about ongoing stressors. Refer to the outpatient substance abuse treatment and medically stable  Psych consultation will sign off on this case and contact again if we can help further in this case  Disposition:  Referred to the outpatient psychiatric services when medically stable.    Marizol Borror,JANARDHAHA R. 04/13/2014 2:47 PM

## 2014-04-14 ENCOUNTER — Encounter: Payer: Self-pay | Admitting: Gastroenterology

## 2014-04-14 LAB — CBC
HEMATOCRIT: 35.6 % — AB (ref 39.0–52.0)
Hemoglobin: 10 g/dL — ABNORMAL LOW (ref 13.0–17.0)
MCH: 22.6 pg — AB (ref 26.0–34.0)
MCHC: 28.1 g/dL — AB (ref 30.0–36.0)
MCV: 80.5 fL (ref 78.0–100.0)
PLATELETS: 511 10*3/uL — AB (ref 150–400)
RBC: 4.42 MIL/uL (ref 4.22–5.81)
RDW: 26.8 % — AB (ref 11.5–15.5)
WBC: 10.1 10*3/uL (ref 4.0–10.5)

## 2014-04-14 MED ORDER — FERROUS SULFATE 325 (65 FE) MG PO TABS
325.0000 mg | ORAL_TABLET | Freq: Every day | ORAL | Status: DC
  Administered 2014-04-14 – 2014-04-15 (×2): 325 mg via ORAL
  Filled 2014-04-14 (×3): qty 1

## 2014-04-14 MED ORDER — AMOXICILLIN-POT CLAVULANATE 875-125 MG PO TABS
1.0000 | ORAL_TABLET | Freq: Two times a day (BID) | ORAL | Status: DC
  Administered 2014-04-14 – 2014-04-15 (×2): 1 via ORAL
  Filled 2014-04-14 (×3): qty 1

## 2014-04-14 MED ORDER — POTASSIUM CHLORIDE CRYS ER 20 MEQ PO TBCR
40.0000 meq | EXTENDED_RELEASE_TABLET | Freq: Every day | ORAL | Status: DC
  Administered 2014-04-14 – 2014-04-15 (×2): 40 meq via ORAL
  Filled 2014-04-14 (×2): qty 2

## 2014-04-14 MED ORDER — MAGNESIUM OXIDE 400 (241.3 MG) MG PO TABS
400.0000 mg | ORAL_TABLET | Freq: Every day | ORAL | Status: DC
  Administered 2014-04-14 – 2014-04-15 (×2): 400 mg via ORAL
  Filled 2014-04-14 (×2): qty 1

## 2014-04-14 NOTE — Progress Notes (Signed)
Daily Rounding Note  04/14/2014, 8:23 AM  LOS: 5 days   SUBJECTIVE:       Tolerating solids.  Still with non-focal abdominal pain.  About 4 soft, formed stools yesterday: at baseline.  Sitter in room due to delerium.     OBJECTIVE:         Vital signs in last 24 hours:    Temp:  [97.2 F (36.2 C)-98.6 F (37 C)] 98.4 F (36.9 C) (01/28 0611) Pulse Rate:  [85-99] 85 (01/28 0611) Resp:  [18-20] 18 (01/28 0611) BP: (125-128)/(75-85) 125/77 mmHg (01/28 0611) SpO2:  [98 %-100 %] 98 % (01/28 0611) Last BM Date: 04/13/14 Filed Weights   04/09/14 1738 04/09/14 2122 04/11/14 2049  Weight: 115 lb (52.164 kg) 113 lb 1.5 oz (51.3 kg) 124 lb 5.4 oz (56.4 kg)   General: looks better, comfortable   Heart: Regular, tachy in low 100s.  Chest: clear bil.  No labored breathing Abdomen: soft, NT, ND.  Active BS  Extremities: no CCE Neuro/Psych:  Pleasant, cooperative.  Still a bit tremulous.   Intake/Output from previous day: 01/27 0701 - 01/28 0700 In: 670 [P.O.:670] Out: 2 [Urine:2]  Intake/Output this shift:    Lab Results:  Recent Labs  04/11/14 0825 04/12/14 0730 04/13/14 0657  WBC 10.8* 9.8 11.2*  HGB 8.3* 8.2* 8.4*  HCT 28.0* 28.9* 30.2*  PLT 409* 480* 466*   BMET  Recent Labs  04/11/14 0825 04/12/14 0730 04/13/14 0657  NA 136 138 137  K 2.9* 3.8 3.3*  CL 99 107 106  CO2 26 21 22   GLUCOSE 105* 113* 101*  BUN <5* <5* <5*  CREATININE 0.78 0.76 0.88  CALCIUM 8.1* 9.0 8.7   LFT  Recent Labs  04/11/14 0825 04/12/14 0730 04/13/14 0657  PROT 6.3 6.6 6.2  ALBUMIN 3.0* 3.2* 3.0*  AST 51* 67* 89*  ALT 53 55* 61*  ALKPHOS 107 109 101  BILITOT 1.2 0.8 0.6   PT/INR  Recent Labs  04/11/14 0825  LABPROT 14.2  INR 1.09   Hepatitis Panel  Recent Labs  04/11/14 1731  HEPBSAG NEGATIVE    Studies/Results: No results found.   Scheduled Meds: . antiseptic oral rinse  7 mL Mouth Rinse q12n4p    . azaTHIOprine  50 mg Oral Daily  . chlordiazePOXIDE  25 mg Oral BH-qamhs   Followed by  . [START ON 04/15/2014] chlordiazePOXIDE  25 mg Oral Daily  . chlorhexidine  15 mL Mouth Rinse BID  . folic acid  1 mg Oral Daily  . multivitamin with minerals  1 tablet Oral Daily  . nicotine  21 mg Transdermal Daily  . piperacillin-tazobactam (ZOSYN)  IV  3.375 g Intravenous 3 times per day  . predniSONE  20 mg Oral Q breakfast  . sodium chloride  3 mL Intravenous Q12H  . thiamine  100 mg Oral Daily   Continuous Infusions: . sodium chloride 10 mL/hr at 04/14/14 0521   PRN Meds:.chlordiazePOXIDE, hydrOXYzine, loperamide, morphine injection, ondansetron   ASSESMENT:   *  Crohn's disease.   On chronic Imuran.  Rectal ulcer on 11/2013 Flax sig.  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 Prednisone 20 mg daily reinitiated   *  Chronic transaminase elevation.  ETOH abuse. Acute withdrawal observed during admission, seizure PTA.  Withdrawal is keeping him hospitalized at this point  *  Chronic anemia.  Iron deficient. S/p PRBCs x 2.  Iron dextran given 1/24.    PLAN   *  Does he still require Zosyn? "Sepsis" has resolved.  *  Will add po Iron.    Azucena Freed  04/14/2014, 8:23 AM Pager: [phone]   ________________________________________________________________________  [facility] GI MD note:  I personally examined the patient, reviewed the data and agree with the assessment and plan described above.  I think zosyn can be stopped and he can complete 7 day course of augmentin.  We will arrange follow up appt in our office for 3-4 weeks from now. He should stay on prednisone 20mg  daily until that appointment. If he follows up, can consider other IBD options.  Hep B, Quant gold, HIV negative.  Given fistulizing Crohn's he is a candidate for biologics.   Owens Loffler, MD [facility] Pager [phone]

## 2014-04-14 NOTE — Progress Notes (Signed)
Tensed APP TEAM Progress Note  Benjamin Shelton PNT:[id number] DOB: 09-Nov-1980 DOA: 04/09/2014 PCP: No PCP Per Patient  Admit HPI / Brief Narrative: 34 y.o. male with h/o Crohn's disease who presented to the ED at [facility] after a seizure, with no prior hx of this. Was riding in car (as a passenger) when he had sudden onset of seizure activity which lasted several mins and resolved spontaneously.  Patient was initially confused but was alert and oriented after arrival to ED. Had mild abdominal pain before seizure (chronic abdominal pain from crohns) and developed fever and chills after arrival (100.4 on arrival).  Work up in ED demonstrated HGB of 5.5. He was admitted in September with chronic blood loss anemia / iron deficiency anemia from Crohn's and had a HGB on admission at that time of 7.0 which improved to 10.5 after transfusion. Iron studies done last May confirmed a fairly profound Iron deficiency anemia with Iron level of 11. Patient had not been on any form of Iron supplementation since his discharge in Sept.  Since admission patient has been evaluated by gastroenterology service. Given fistula rising Crohn's colitis Biologics would be the preferred medication but because of serious compliance issues GI will not prescribe at this time. They have documented he has been inconsistent with follow-up at the office. Currently colitis exacerbation is being managed with prednisone.   Patient also with significant alcohol intake regularly prior to admission. He was on a CIWA protocol before being transferred out of the stepdown unit. On the a.m. of 1/26 patient demonstrated florid alcohol withdrawal. Detox protocol was adjusted to use Librium along with prn Ativan. Patient expressed desire to leave AMA but was not oriented and did not have capacity to make this decision so urgent psychiatric consultation also placed on 1/26. Concern was patient may need to be involuntarily committed to complete  appropriate treatment. Sitter assigned to the patient's room.  HPI/Subjective: Much better. Less tremulous. Pain is better control  Assessment/Plan: EtOH abuse/acute alcohol withdrawal -Developed s/sx of moderate withdrawal requiring CIWA to 4 hours prior to transfer out of stepdown- at time of transfer out of stepdown he was currently well controlled w/ no evidence of worsening sx  -on the a.m. of 1/26 patient in florid alcohol withdrawal with confusion, tremors and hypertension. -Patient required to be place on IVC and with sitter with lack of capacity by psychiatrist -Appreciate psychiatric consultation  -CIWA scores markedly improved noted up to 16 on 1/26 and today down to 3-5 -will continue detox process; anticipate discharge home in next 24-48 hours with librium BID 10mg  X 3 days; then 10mg  Daily X 3 days; then 5mg  BID X 3 days; then 5mg  daily X 3 days  Seizure v/s syncopal convulsions  -Single episode without no prior hx of same  - CT head unrevealing  - suspect this was actually an episode of orthostatic syncope w/ convulsions relate to severe anemia  - no recurrence in hospital  -possibility seizure-like activity could've been related to evolving alcohol withdrawal as well   Fe deficiency anemia - chronic blood loss anemia  Hgb improved nicely w/ transfusion and is holding steady at 8.3  - follow trend  - Fe 18 w/ TIBC of 460 - loaded w/ full dose IV Fe 1/24 and started on PO iron by GI  SIRS v/s sepsis (Temp 100.4 + HR 130) -SIRS physiology has resolved  -withdrawal also contributing to his symptoms  Hypokalemia and hypomagnesemia  -continue repletion as needed  Crohn's disease -  proctitis w/ fistulas -CT pelvis with active proctitis w/ lower rectal fistula into pelvic floor musculature and developing fistula near upper rectum but no abscess noted  -appreciate GI assistance -patient with documented history of poor compliance and therefore not a candidate to initiate  biologic therapy at this moment -recommendation is to continue prednisone at current dosage until follow up with GI in outpatient setting -also continue low dose imuran -antibiotics change to PO augmentin and plan is to treat for 1 week -May need to repeat CT pelvis before discharge if pain worsen. Since reported as evolving fistula and may be developing abscess given ongoing pain; but currently no fever, WBC's normal and tolerating PO's  Transaminitis  -significantly improved to slight elevation -most likely due to ETOH  Lactic acidosis  Resolved  Code Status: FULL Family Communication: no family present at time of exam Disposition Plan: Inpatient to complete detoxification; home in the next 24-48 hours  Consultants: [facility] (Dr. Ardis Hughs)  Procedures: See below for x-ray reports  Antibiotics: Zosyn 1/23 >1/28 augmentin 1/28 (plan is for 1 week of treatment)  DVT prophylaxis: SCDs  Objective: Blood pressure 131/84, pulse 90, temperature 97.9 F (36.6 C), temperature source Oral, resp. rate 20, height 5\' 3"  (1.6 m), weight 56.4 kg (124 lb 5.4 oz), SpO2 100 %.  Intake/Output Summary (Last 24 hours) at 04/14/14 1646 Last data filed at 04/14/14 1314  Gross per 24 hour  Intake    480 ml  Output      0 ml  Net    480 ml   Exam: General: feeling better; patient is AAOX3; a whole less tremulous; denies hallucinations.  Lungs: Clear to auscultation bilaterally  Cardiovascular: Regular rate, no rubs or gallops Abdomen: less tender today diffusely - no rebound, no mass - positive BS; no guarding  Extremities: No significant cyanosis, clubbing, or edema bilateral lower extremities  Data Reviewed: Basic Metabolic Panel:  Recent Labs Lab 04/09/14 1745 04/10/14 0600 04/11/14 0825 04/12/14 0730 04/13/14 0657  NA 129* 136 136 138 137  K 3.6 3.5 2.9* 3.8 3.3*  CL 99 102 99 107 106  CO2 14* 24 26 21 22   GLUCOSE 108* 76 105* 113* 101*  BUN 8 <5* <5* <5* <5*  CREATININE  0.93 0.96 0.78 0.76 0.88  CALCIUM 8.8 8.5 8.1* 9.0 8.7    Liver Function Tests:  Recent Labs Lab 04/09/14 1745 04/10/14 0600 04/11/14 0825 04/12/14 0730 04/13/14 0657  AST 133* 71* 51* 67* 89*  ALT 94* 72* 53 55* 61*  ALKPHOS 138* 113 107 109 101  BILITOT 0.6 1.8* 1.2 0.8 0.6  PROT 7.5 6.4 6.3 6.6 6.2  ALBUMIN 4.1 3.4* 3.0* 3.2* 3.0*   Coags:  Recent Labs Lab 04/11/14 0825  INR 1.09    Recent Labs Lab 04/11/14 0825  APTT 48*    CBC:  Recent Labs Lab 04/09/14 1745 04/10/14 0600 04/11/14 0825 04/12/14 0730 04/13/14 0657 04/14/14 0928  WBC 11.6* 9.2 10.8* 9.8 11.2* 10.1  NEUTROABS 9.4*  --   --   --   --   --   HGB 5.5* 8.3* 8.3* 8.2* 8.4* 10.0*  HCT 21.2* 28.7* 28.0* 28.9* 30.2* 35.6*  MCV 69.1* 72.8* 73.7* 74.1* 76.8* 80.5  PLT 610* 454* 409* 480* 466* 511*    Recent Results (from the past 240 hour(s))  MRSA PCR Screening     Status: None   Collection Time: 04/09/14  9:42 PM  Result Value Ref Range Status   MRSA by  PCR NEGATIVE NEGATIVE Final    Comment:        The GeneXpert MRSA Assay (FDA approved for NASAL specimens only), is one component of a comprehensive MRSA colonization surveillance program. It is not intended to diagnose MRSA infection nor to guide or monitor treatment for MRSA infections.   Culture, blood (routine x 2)     Status: None (Preliminary result)   Collection Time: 04/09/14 10:22 PM  Result Value Ref Range Status   Specimen Description BLOOD RIGHT ANTECUBITAL  Final   Special Requests   Final    BOTTLES DRAWN AEROBIC AND ANAEROBIC 10CC BLUE [place] RED   Culture   Final           BLOOD CULTURE RECEIVED NO GROWTH TO DATE CULTURE WILL BE HELD FOR 5 DAYS BEFORE ISSUING A FINAL NEGATIVE REPORT Performed at [facility]    Report Status PENDING  Incomplete  Culture, blood (routine x 2)     Status: None (Preliminary result)   Collection Time: 04/09/14 10:30 PM  Result Value Ref Range Status   Specimen Description  BLOOD LEFT HAND  Final   Special Requests BOTTLES DRAWN AEROBIC AND ANAEROBIC 10CC EA  Final   Culture   Final           BLOOD CULTURE RECEIVED NO GROWTH TO DATE CULTURE WILL BE HELD FOR 5 DAYS BEFORE ISSUING A FINAL NEGATIVE REPORT Performed at [facility]    Report Status PENDING  Incomplete     Scheduled Meds:  Scheduled Meds: . amoxicillin-clavulanate  1 tablet Oral Q12H  . antiseptic oral rinse  7 mL Mouth Rinse q12n4p  . azaTHIOprine  50 mg Oral Daily  . chlordiazePOXIDE  25 mg Oral BH-qamhs   Followed by  . [START ON 04/15/2014] chlordiazePOXIDE  25 mg Oral Daily  . chlorhexidine  15 mL Mouth Rinse BID  . ferrous sulfate  325 mg Oral Q breakfast  . folic acid  1 mg Oral Daily  . multivitamin with minerals  1 tablet Oral Daily  . nicotine  21 mg Transdermal Daily  . predniSONE  20 mg Oral Q breakfast  . sodium chloride  3 mL Intravenous Q12H  . thiamine  100 mg Oral Daily    Time spent on care of this patient: 30 mins   Barton Dubois MD [phone]  04/14/2014, 4:46 PM   LOS: 5 days

## 2014-04-15 ENCOUNTER — Inpatient Hospital Stay: Payer: Self-pay

## 2014-04-15 LAB — CBC
HEMATOCRIT: 33 % — AB (ref 39.0–52.0)
Hemoglobin: 9.2 g/dL — ABNORMAL LOW (ref 13.0–17.0)
MCH: 22.3 pg — AB (ref 26.0–34.0)
MCHC: 27.9 g/dL — AB (ref 30.0–36.0)
MCV: 80.1 fL (ref 78.0–100.0)
Platelets: 519 10*3/uL — ABNORMAL HIGH (ref 150–400)
RBC: 4.12 MIL/uL — AB (ref 4.22–5.81)
RDW: 28 % — AB (ref 11.5–15.5)
WBC: 8.9 10*3/uL (ref 4.0–10.5)

## 2014-04-15 LAB — BASIC METABOLIC PANEL
Anion gap: 8 (ref 5–15)
BUN: <5 mg/dL — ABNORMAL LOW (ref 6–23)
CHLORIDE: 105 mmol/L (ref 96–112)
CO2: 26 mmol/L (ref 19–32)
CREATININE: 0.72 mg/dL (ref 0.50–1.35)
Calcium: 8.9 mg/dL (ref 8.4–10.5)
GFR calc non Af Amer: >90 mL/min (ref >90)
GLUCOSE: 94 mg/dL (ref 70–99)
POTASSIUM: 3.6 mmol/L (ref 3.5–5.1)
Sodium: 139 mmol/L (ref 135–145)

## 2014-04-15 MED ORDER — POTASSIUM CHLORIDE CRYS ER 20 MEQ PO TBCR: 40.0000 meq | EXTENDED_RELEASE_TABLET | Freq: Every day | ORAL | Status: DC

## 2014-04-15 MED ORDER — ADULT MULTIVITAMIN W/MINERALS CH: 1.0000 | ORAL_TABLET | Freq: Every day | ORAL | Status: DC

## 2014-04-15 MED ORDER — MAGNESIUM OXIDE 400 (241.3 MG) MG PO TABS: 400.0000 mg | ORAL_TABLET | Freq: Every day | ORAL | Status: DC

## 2014-04-15 MED ORDER — FOLIC ACID 1 MG PO TABS: 1.0000 mg | ORAL_TABLET | Freq: Every day | ORAL | Status: DC

## 2014-04-15 MED ORDER — AMOXICILLIN-POT CLAVULANATE 875-125 MG PO TABS: 1.0000 | ORAL_TABLET | Freq: Two times a day (BID) | ORAL | Status: DC

## 2014-04-15 MED ORDER — OXYCODONE HCL 5 MG PO TABS: 5.0000 mg | ORAL_TABLET | Freq: Four times a day (QID) | ORAL | Status: DC | PRN

## 2014-04-15 MED ORDER — CHLORDIAZEPOXIDE HCL 10 MG PO CAPS: ORAL_CAPSULE | ORAL | Status: DC

## 2014-04-15 MED ORDER — PREDNISONE 20 MG PO TABS: 20.0000 mg | ORAL_TABLET | Freq: Every day | ORAL | Status: DC

## 2014-04-15 MED ORDER — FERROUS SULFATE 325 (65 FE) MG PO TABS: 325.0000 mg | ORAL_TABLET | Freq: Every day | ORAL | Status: DC

## 2014-04-15 NOTE — Progress Notes (Signed)
He has appt in our office on Wed 2/17.  He should not taper his steroids prior to then (stay on pred 20 daily).  He should complete course of Abx as outlined yesterday.  GIven his fistulizing crohn's disease he would be considered for biologics if he follows up.  Please call or page with any further questions or concerns.
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
## 2014-04-15 NOTE — Consult Note (Signed)
Psychiatry Consult follow Up notes  Reason for Consult:  Alcohol abuse and questionable withdrawal seizure Referring Physician:  Dr. Erlinda Hong  Patient Identification: Benjamin Shelton MRN:  [id number] Principal Diagnosis: Alcohol withdrawal and status post seizure Diagnosis:   Patient Active Problem List   Diagnosis Date Noted  . Generalized abdominal pain [R10.84]   . Alcohol withdrawal delirium [F10.231] 04/12/2014  . Seizure [R56.9] 04/09/2014  . SIRS (systemic inflammatory response syndrome) [A41.9] 04/09/2014  . Abdominal pain [R10.9] 04/09/2014  . Severe malnutrition [E41] 11/26/2013  . Crohn disease [K50.90] 11/25/2013  . Microcytic hypochromic anemia [D50.9] 11/25/2013  . Exacerbation of Crohn's disease [K50.90] 11/25/2013  . Alcohol abuse [F10.10] 11/25/2013  . Tobacco abuse [Z72.0] 11/25/2013  . Inflammatory bowel diseases (IBD) [K63.89] 10/21/2013  . Abdominal pain, other specified site [R10.84] 08/02/2013  . Iron deficiency anemia [D50.9] 08/02/2013  . IBD (inflammatory bowel disease) [K63.89] 07/29/2013    Total Time spent with patient: 15 minutes  Subjective:   Benjamin Shelton is a 34 y.o. male patient admitted with status post seizure and alcohol withdrawal symptoms.  HPI:  Benjamin Shelton is a 34 years old young male seen and chart reviewed for psychiatric consultation and evaluation. Patient was admitted for status post seizure and has multiple medical problems as listed above. Patient reported he never had a seizure in the past but endorses drinking alcohol 2 beers 3 times a week along with his friend for the last 6 months. Patient also reported he has been disabled because of multiple medical problems and unemployed for the last 2 years and failed to receive disability benefits from the [employer], site. Patient has been receiving food stamps and sheltered by her niece. Patient seems to be minimizing his amount of drinking and also denies previous history  of alcohol detox treatment and rehabilitation services. Patient also reported is not interested in receiving alcohol/substance abuse rehabilitation once medically stable. Patient denied current symptoms of alcohol withdrawal, craving and denies symptoms of depression, anxiety, suicidal or homicidal ideation, intention or plans. Agree with the involuntary commitment as patient has limited knowledge about withdrawal symptoms and consequences without treatment. Patient will be referred to the outpatient psychiatric services and substance abuse treatment and medically stable.  Interval history: Patient has no complaints today and found him sitting at the age of the bed, calm and cooperative. Patient has no alcohol craving or withdrawal symptoms at this time. Patient stated he has been determined to quit drinking because it causes trouble with his stomach. It isn't denies alcohol dependence and need of alcohol rehabilitation treatment which is offered to him. Patient is hoping to be discharged home today and is willing to follow up with outpatient psychiatric services. Reportedly he was incarcerated for 4 1/2 years in [place] for stealing along with an associate in a pharmacy.  He lives with his niece and her family. Patient reported his niece is supportive to him.    Past Medical History:  Past Medical History  Diagnosis Date  . Crohn's disease dx'd 14  . Rectal ulcer     Past Surgical History  Procedure Laterality Date  . Hernia repair      "stomach"  . Colostomy    . Partial colectomy    . Colostomy reversal    . Flexible sigmoidoscopy N/A 11/27/2013    Procedure: FLEXIBLE SIGMOIDOSCOPY;  Surgeon: Beryle Beams, MD;  [facility];  Service: Endoscopy;  Laterality: N/A;   Family History:  Family History  Problem Relation Age of  Onset  . Cancer Mother 25    Breast   Social History:  History  Alcohol Use  . 1.2 oz/week  . 2 Shots of liquor per week     History  Drug Use No     History   Social History  . Marital Status: Single    Spouse Name: N/A    Number of Children: N/A  . Years of Education: N/A   Social History Main Topics  . Smoking status: Current Every Day Smoker -- 0.25 packs/day for 23 years    Types: Cigarettes  . Smokeless tobacco: Never Used  . Alcohol Use: 1.2 oz/week    2 Shots of liquor per week  . Drug Use: No  . Sexual Activity: Yes   Other Topics Concern  . None   Social History Narrative   Additional Social History:                          Allergies:   Allergies  Allergen Reactions  . Ibuprofen Itching  . Tramadol Other (See Comments)    irritates chrons     Vitals: Blood pressure 124/74, pulse 82, temperature 98.1 F (36.7 C), temperature source Oral, resp. rate 18, height 5\' 3"  (1.6 m), weight 56.4 kg (124 lb 5.4 oz), SpO2 98 %.  Risk to Self: Is patient at risk for suicide?: No Risk to Others:   Prior Inpatient Therapy:   Prior Outpatient Therapy:    Current Facility-Administered Medications  Medication Dose Route Frequency Provider Last Rate Last Dose  . 0.9 %  sodium chloride infusion   Intravenous Continuous Cherene Altes, MD 10 mL/hr at 04/14/14 [phone]    . amoxicillin-clavulanate (AUGMENTIN) 875-125 MG per tablet 1 tablet  1 tablet Oral Q12H Barton Dubois, MD   1 tablet at 04/15/14 1008  . azaTHIOprine (IMURAN) tablet 50 mg  50 mg Oral Daily Etta Quill, DO   50 mg at 04/15/14 1008  . ferrous sulfate tablet 325 mg  325 mg Oral Q breakfast Vena Rua, PA-C   325 mg at 04/15/14 0820  . folic acid (FOLVITE) tablet 1 mg  1 mg Oral Daily Etta Quill, DO   1 mg at 04/15/14 1008  . magnesium oxide (MAG-OX) tablet 400 mg  400 mg Oral Daily Barton Dubois, MD   400 mg at 04/15/14 1008  . morphine 2 MG/ML injection 2-4 mg  2-4 mg Intravenous Q4H PRN Cherene Altes, MD   2 mg at 04/15/14 0600  . multivitamin with minerals tablet 1 tablet  1 tablet Oral Daily Etta Quill, DO   1 tablet  at 04/15/14 1008  . nicotine (NICODERM CQ - dosed in mg/24 hours) patch 21 mg  21 mg Transdermal Daily Florencia Reasons, MD   21 mg at 04/15/14 1008  . potassium chloride SA (K-DUR,KLOR-CON) CR tablet 40 mEq  40 mEq Oral Daily Barton Dubois, MD   40 mEq at 04/15/14 1008  . predniSONE (DELTASONE) tablet 20 mg  20 mg Oral Q breakfast Milus Banister, MD   20 mg at 04/15/14 0600  . sodium chloride 0.9 % injection 3 mL  3 mL Intravenous Q12H Etta Quill, DO   3 mL at 04/15/14 1009  . thiamine (VITAMIN B-1) tablet 100 mg  100 mg Oral Daily Etta Quill, DO   100 mg at 04/15/14 1008    Musculoskeletal: Strength & Muscle Tone: within Benjamin limits Gait &  Station: Benjamin Patient leans: N/A  Psychiatric Specialty Exam: Physical Exam as per history and physical   ROS, calm and cooperative   Blood pressure 124/74, pulse 82, temperature 98.1 F (36.7 C), temperature source Oral, resp. rate 18, height 5\' 3"  (1.6 m), weight 56.4 kg (124 lb 5.4 oz), SpO2 98 %.Body mass index is 22.03 kg/(m^2).  General Appearance: Casual  Eye Contact::  Good  Speech:  Clear and Coherent  Volume:  Decreased  Mood:  Anxious  Affect:  Appropriate and Congruent  Thought Process:  Coherent and Goal Directed  Orientation:  Full (Time, Place, and Person)  Thought Content:  WDL  Suicidal Thoughts:  No  Homicidal Thoughts:  No  Memory:  Immediate;   Good Recent;   Good  Judgement:  Impaired  Insight:  Lacking  Psychomotor Activity:  Decreased  Concentration:  Good  Recall:  Good  Fund of Knowledge:Good  Language: Good  Akathisia:  NA  Handed:  Right  AIMS (if indicated):     Assets:  Communication Skills Desire for Improvement Housing Intimacy Leisure Time Resilience Social Support Talents/Skills  ADL's:  Intact  Cognition: WNL  Sleep:      Medical Decision Making: Self-Limited or Minor (1), Review of Psycho-Social Stressors (1), Review or order clinical lab tests (1), Discuss test with performing  physician (1), Established Problem, Worsening (2), Review or order medicine tests (1), Independent Review of image, tracing or specimen (2), Review of Medication Regimen & Side Effects (2) and Review of New Medication or Change in Dosage (2)  Treatment Plan Summary: Daily contact with patient to assess and evaluate symptoms and progress in treatment and Medication management  Plan:  Continue alcohol detox treatment and supportive therapy Patient has declined substance abuse rehabilitation treatment No evidence of imminent risk to self or others at present.   Patient does not meet criteria for psychiatric inpatient admission. Supportive therapy provided about ongoing stressors. Refer to the outpatient substance abuse treatment and medically stable  Psych consultation will sign off on this case and contact again if we can help further in this case  Disposition:  Referred to the outpatient psychiatric services when medically stable.    Dacia Capers,JANARDHAHA R. 04/15/2014 10:42 AM

## 2014-04-16 LAB — CULTURE, BLOOD (ROUTINE X 2)
CULTURE: NO GROWTH
Culture: NO GROWTH

## 2014-04-19 ENCOUNTER — Ambulatory Visit: Payer: Self-pay | Attending: Family Medicine | Admitting: Family Medicine

## 2014-04-19 ENCOUNTER — Encounter: Payer: Self-pay | Admitting: Family Medicine

## 2014-04-19 VITALS — BP 114/76 | HR 76 | Temp 98.6°F | Resp 16 | Ht 63.0 in | Wt 126.0 lb

## 2014-04-19 DIAGNOSIS — D509 Iron deficiency anemia, unspecified: Secondary | ICD-10-CM

## 2014-04-19 DIAGNOSIS — R569 Unspecified convulsions: Secondary | ICD-10-CM | POA: Insufficient documentation

## 2014-04-19 DIAGNOSIS — K5090 Crohn's disease, unspecified, without complications: Secondary | ICD-10-CM | POA: Insufficient documentation

## 2014-04-19 DIAGNOSIS — K50913 Crohn's disease, unspecified, with fistula: Secondary | ICD-10-CM

## 2014-04-19 MED ORDER — TRAMADOL HCL 50 MG PO TABS: 50.0000 mg | ORAL_TABLET | Freq: Three times a day (TID) | ORAL | Status: DC | PRN

## 2014-04-19 NOTE — Assessment & Plan Note (Signed)
1. Abdominal pain due to Crohn's: stepping down from oxycodone to tramadol I recommend avoid alcohol completely. Please keep f/u with GI.
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
## 2014-04-19 NOTE — Patient Instructions (Signed)
Benjamin Shelton,  Thank you for coming in today. It was a pleasure meeting you. I look forward to being your primary doctor.   1. Abdominal pain due to Crohn's: stepping down from oxycodone to tramadol I recommend avoid alcohol completely. Please keep f/u with GI.   2. Anemia: continue iron and folic acid. Repeat CBC today.  You will be called with lab results.   F/u in 3 month for Crohn's disease  Dr. Adrian Blackwater

## 2014-04-19 NOTE — Assessment & Plan Note (Signed)
2. Anemia: continue iron and folic acid. Repeat CBC today.

## 2014-04-19 NOTE — Progress Notes (Signed)
   Subjective:    Patient ID: Benjamin Shelton, male    DOB: 1980/10/26, 34 y.o.   MRN: [id number] CC: ED f/u for chron's flare, anemia, seizure  HPI 34 yo HFU NP:  1. Chrohn's dz: denies melena, hematochezia, N/V/D. Has abdominal pain. Daily ETOH. Has information about GI f/u.   2. Seizures: no recurrent seizures.   Soc Hx: daily ETOH, patient reports wine cooler. Smoker, + THC.  Med Hx: Chron'd dz with fistula  Surg Hx: partial colectomy  Review of Systems As per HPI     Objective:   Physical Exam BP 114/76 mmHg  Pulse 76  Temp(Src) 98.6 F (37 C) (Oral)  Resp 16  Ht 5\' 3"  (1.6 m)  Wt 126 lb (57.153 kg)  BMI 22.33 kg/m2  SpO2 97% General appearance: alert, cooperative and no distress Lungs: clear to auscultation bilaterally Heart: regular rate and rhythm, S1, S2 normal, no murmur, click, rub or gallop Abdomen: multiple healed scars, midline surgical mesh partially visible, soft, NT/ND      Assessment & Plan:

## 2014-04-20 LAB — CBC
HCT: 38.6 % — ABNORMAL LOW (ref 39.0–52.0)
Hemoglobin: 11 g/dL — ABNORMAL LOW (ref 13.0–17.0)
MCH: 23.3 pg — ABNORMAL LOW (ref 26.0–34.0)
MCHC: 28.5 g/dL — AB (ref 30.0–36.0)
MCV: 81.6 fL (ref 78.0–100.0)
MPV: 9.4 fL (ref 8.6–12.4)
Platelets: 858 10*3/uL — ABNORMAL HIGH (ref 150–400)
RBC: 4.73 MIL/uL (ref 4.22–5.81)
RDW: 28.7 % — AB (ref 11.5–15.5)
WBC: 7.8 10*3/uL (ref 4.0–10.5)

## 2014-04-21 ENCOUNTER — Telehealth: Payer: Self-pay | Admitting: *Deleted

## 2014-04-21 NOTE — Telephone Encounter (Signed)
-----   Message from Minerva Ends, MD sent at 04/20/2014  4:02 PM EST ----- Improved anemia, Hgb up to 11 continue iron

## 2014-04-21 NOTE — Telephone Encounter (Signed)
Pt aware of results 

## 2014-05-02 ENCOUNTER — Encounter: Payer: Self-pay | Admitting: Gastroenterology

## 2014-05-04 ENCOUNTER — Telehealth: Payer: Self-pay

## 2014-05-04 ENCOUNTER — Encounter: Payer: Self-pay | Admitting: Nurse Practitioner

## 2014-05-04 NOTE — Telephone Encounter (Signed)
Message  Received: Today    Willia Craze, NP  Kellie Moor, RN           Barbera Setters, can you make sure that Mayme Genta appt is cancelled please. We only need to provide emergent care for next 30 days.  Thanks,  PG     appt cancelled per Tye Savoy RNP

## 2014-05-04 NOTE — Telephone Encounter (Signed)
Patient has multiple no show and same day cancellations.  Patient did arrive for his appointment today and was 18 minutes late, he was told he would be seen after the patient that did arrive on time.  He did not want to wait and left. Per Tye Savoy RNP patient to be discharged from the practice for multiple no shows.  Discharge paperwork and letter to Dr. Fuller Plan to sign.

## 2014-05-06 ENCOUNTER — Telehealth: Payer: Self-pay | Admitting: Gastroenterology

## 2014-05-06 NOTE — Telephone Encounter (Signed)
Dismissal Letter sent by Certified Mail [id number]  Certified dismissal letter returned as undeliverable, unclaimed, return to sender after three attempts by Lafe. Letter placed in another envelope and resent as 1st class mail which does not require a signature. 05/26/14  DAJ

## 2014-05-16 ENCOUNTER — Ambulatory Visit: Payer: Self-pay | Admitting: Nurse Practitioner

## 2014-05-16 ENCOUNTER — Telehealth: Payer: Self-pay | Admitting: Nurse Practitioner

## 2014-05-16 NOTE — Telephone Encounter (Signed)
I left a detailed message for the patient that he has been discharged from our practice and that we will not be rescheduling any further appointments.  I asked that if he has any additional questions or concerns he may call back

## 2014-07-11 ENCOUNTER — Ambulatory Visit: Payer: Self-pay | Admitting: Family Medicine

## 2014-09-10 ENCOUNTER — Emergency Department (HOSPITAL_COMMUNITY)
Admission: EM | Admit: 2014-09-10 | Discharge: 2014-09-10 | Disposition: A | Discharge status: Home / Self Care | Payer: Self-pay | Attending: Emergency Medicine | Admitting: Emergency Medicine

## 2014-09-10 ENCOUNTER — Encounter (HOSPITAL_COMMUNITY): Payer: Self-pay | Admitting: *Deleted

## 2014-09-10 DIAGNOSIS — F1210 Cannabis abuse, uncomplicated: Secondary | ICD-10-CM | POA: Insufficient documentation

## 2014-09-10 DIAGNOSIS — R000 Tachycardia, unspecified: Secondary | ICD-10-CM | POA: Insufficient documentation

## 2014-09-10 DIAGNOSIS — R109 Unspecified abdominal pain: Secondary | ICD-10-CM | POA: Insufficient documentation

## 2014-09-10 DIAGNOSIS — D509 Iron deficiency anemia, unspecified: Secondary | ICD-10-CM | POA: Insufficient documentation

## 2014-09-10 DIAGNOSIS — Z8719 Personal history of other diseases of the digestive system: Secondary | ICD-10-CM | POA: Insufficient documentation

## 2014-09-10 DIAGNOSIS — R569 Unspecified convulsions: Secondary | ICD-10-CM

## 2014-09-10 DIAGNOSIS — Z79899 Other long term (current) drug therapy: Secondary | ICD-10-CM | POA: Insufficient documentation

## 2014-09-10 DIAGNOSIS — R1084 Generalized abdominal pain: Secondary | ICD-10-CM | POA: Insufficient documentation

## 2014-09-10 DIAGNOSIS — Z720 Tobacco use: Secondary | ICD-10-CM | POA: Insufficient documentation

## 2014-09-10 DIAGNOSIS — F10939 Alcohol use, unspecified with withdrawal, unspecified: Secondary | ICD-10-CM

## 2014-09-10 DIAGNOSIS — F10239 Alcohol dependence with withdrawal, unspecified: Secondary | ICD-10-CM | POA: Insufficient documentation

## 2014-09-10 DIAGNOSIS — G8929 Other chronic pain: Secondary | ICD-10-CM | POA: Insufficient documentation

## 2014-09-10 LAB — COMPREHENSIVE METABOLIC PANEL
ALBUMIN: 4.4 g/dL (ref 3.5–5.0)
ALK PHOS: 124 U/L (ref 38–126)
ALT: 88 U/L — AB (ref 17–63)
AST: 157 U/L — AB (ref 15–41)
Anion gap: 19 — ABNORMAL HIGH (ref 5–15)
BUN: <5 mg/dL — ABNORMAL LOW (ref 6–20)
CALCIUM: 9.5 mg/dL (ref 8.9–10.3)
CHLORIDE: 100 mmol/L — AB (ref 101–111)
CO2: 18 mmol/L — ABNORMAL LOW (ref 22–32)
Creatinine, Ser: 1.1 mg/dL (ref 0.61–1.24)
GFR calc Af Amer: >60 mL/min (ref >60)
GFR calc non Af Amer: >60 mL/min (ref >60)
GLUCOSE: 147 mg/dL — AB (ref 65–99)
Potassium: 4.8 mmol/L (ref 3.5–5.1)
Sodium: 137 mmol/L (ref 135–145)
Total Bilirubin: 0.8 mg/dL (ref 0.3–1.2)
Total Protein: 8.3 g/dL — ABNORMAL HIGH (ref 6.5–8.1)

## 2014-09-10 LAB — RAPID URINE DRUG SCREEN, HOSP PERFORMED
Amphetamines: NOT DETECTED
BENZODIAZEPINES: NOT DETECTED
Barbiturates: NOT DETECTED
Cocaine: NOT DETECTED
Opiates: NOT DETECTED
Tetrahydrocannabinol: POSITIVE — AB

## 2014-09-10 LAB — CBC WITH DIFFERENTIAL/PLATELET
BASOS ABS: 0.1 10*3/uL (ref 0.0–0.1)
Basophils Relative: 1 % (ref 0–1)
EOS ABS: 0 10*3/uL (ref 0.0–0.7)
EOS PCT: 0 % (ref 0–5)
HEMATOCRIT: 35.4 % — AB (ref 39.0–52.0)
Hemoglobin: 10.7 g/dL — ABNORMAL LOW (ref 13.0–17.0)
LYMPHS ABS: 0.7 10*3/uL (ref 0.7–4.0)
Lymphocytes Relative: 6 % — ABNORMAL LOW (ref 12–46)
MCH: 23.6 pg — ABNORMAL LOW (ref 26.0–34.0)
MCHC: 30.2 g/dL (ref 30.0–36.0)
MCV: 78 fL (ref 78.0–100.0)
Monocytes Absolute: 0.8 10*3/uL (ref 0.1–1.0)
Monocytes Relative: 7 % (ref 3–12)
Neutro Abs: 9.3 10*3/uL — ABNORMAL HIGH (ref 1.7–7.7)
Neutrophils Relative %: 86 % — ABNORMAL HIGH (ref 43–77)
PLATELETS: 400 10*3/uL (ref 150–400)
RBC: 4.54 MIL/uL (ref 4.22–5.81)
RDW: 16.8 % — ABNORMAL HIGH (ref 11.5–15.5)
WBC: 11 10*3/uL — AB (ref 4.0–10.5)

## 2014-09-10 LAB — LIPASE, BLOOD: Lipase: 19 U/L — ABNORMAL LOW (ref 22–51)

## 2014-09-10 LAB — I-STAT CG4 LACTIC ACID, ED
Lactic Acid, Venous: 8.55 mmol/L (ref 0.5–2.0)
Lactic Acid, Venous: 2.43 mmol/L (ref 0.5–2.0)

## 2014-09-10 MED ORDER — HYDROMORPHONE HCL 1 MG/ML IJ SOLN
1.0000 mg | Freq: Once | INTRAMUSCULAR | Status: AC
  Administered 2014-09-10: 1 mg via INTRAVENOUS
  Filled 2014-09-10: qty 1

## 2014-09-10 MED ORDER — SODIUM CHLORIDE 0.9 % IV BOLUS (SEPSIS)
1000.0000 mL | Freq: Once | INTRAVENOUS | Status: AC
  Administered 2014-09-10: 1000 mL via INTRAVENOUS

## 2014-09-10 MED ORDER — LORAZEPAM 2 MG/ML IJ SOLN
1.0000 mg | Freq: Once | INTRAMUSCULAR | Status: AC
  Administered 2014-09-10: 1 mg via INTRAVENOUS
  Filled 2014-09-10: qty 1

## 2014-09-10 MED ORDER — CHLORDIAZEPOXIDE HCL 10 MG PO CAPS: ORAL_CAPSULE | ORAL | Status: DC

## 2014-09-10 MED ORDER — PROMETHAZINE HCL 25 MG PO TABS: 25.0000 mg | ORAL_TABLET | Freq: Four times a day (QID) | ORAL | Status: DC | PRN

## 2014-09-10 MED ORDER — OXYCODONE HCL 5 MG PO TABS: 5.0000 mg | ORAL_TABLET | Freq: Four times a day (QID) | ORAL | Status: DC | PRN

## 2014-09-10 NOTE — ED Notes (Signed)
Phlebotomy at bedside.

## 2014-09-10 NOTE — ED Notes (Signed)
Pt given water for a fluid challenge.

## 2014-09-10 NOTE — Discharge Instructions (Signed)
Alcohol Use Disorder Alcohol use disorder is a mental disorder. It is not a one-time incident of heavy drinking. Alcohol use disorder is the excessive and uncontrollable use of alcohol over time that leads to problems with functioning in one or more areas of daily living. People with this disorder risk harming themselves and others when they drink to excess. Alcohol use disorder also can cause other mental disorders, such as mood and anxiety disorders, and serious physical problems. People with alcohol use disorder often misuse other drugs.  Alcohol use disorder is common and widespread. Some people with this disorder drink alcohol to cope with or escape from negative life events. Others drink to relieve chronic pain or symptoms of mental illness. People with a family history of alcohol use disorder are at higher risk of losing control and using alcohol to excess.  SYMPTOMS  Signs and symptoms of alcohol use disorder may include the following:   Consumption ofalcohol inlarger amounts or over a longer period of time than intended.  Multiple unsuccessful attempts to cutdown or control alcohol use.   A great deal of time spent obtaining alcohol, using alcohol, or recovering from the effects of alcohol (hangover).  A strong desire or urge to use alcohol (cravings).   Continued use of alcohol despite problems at work, school, or home because of alcohol use.   Continued use of alcohol despite problems in relationships because of alcohol use.  Continued use of alcohol in situations when it is physically hazardous, such as driving a car.  Continued use of alcohol despite awareness of a physical or psychological problem that is likely related to alcohol use. Physical problems related to alcohol use can involve the brain, heart, liver, stomach, and intestines. Psychological problems related to alcohol use include intoxication, depression, anxiety, psychosis, delirium, and dementia.   The need for  increased amounts of alcohol to achieve the same desired effect, or a decreased effect from the consumption of the same amount of alcohol (tolerance).  Withdrawal symptoms upon reducing or stopping alcohol use, or alcohol use to reduce or avoid withdrawal symptoms. Withdrawal symptoms include:  Racing heart.  Hand tremor.  Difficulty sleeping.  Nausea.  Vomiting.  Hallucinations.  Restlessness.  Seizures. DIAGNOSIS Alcohol use disorder is diagnosed through an assessment by your health care provider. Your health care provider may start by asking three or four questions to screen for excessive or problematic alcohol use. To confirm a diagnosis of alcohol use disorder, at least two symptoms must be present within a 12-month period. The severity of alcohol use disorder depends on the number of symptoms:  Mild--two or three.  Moderate--four or five.  Severe--six or more. Your health care provider may perform a physical exam or use results from lab tests to see if you have physical problems resulting from alcohol use. Your health care provider may refer you to a mental health professional for evaluation. TREATMENT  Some people with alcohol use disorder are able to reduce their alcohol use to low-risk levels. Some people with alcohol use disorder need to quit drinking alcohol. When necessary, mental health professionals with specialized training in substance use treatment can help. Your health care provider can help you decide how severe your alcohol use disorder is and what type of treatment you need. The following forms of treatment are available:   Detoxification. Detoxification involves the use of prescription medicines to prevent alcohol withdrawal symptoms in the first week after quitting. This is important for people with a history of symptoms   of withdrawal and for heavy drinkers who are likely to have withdrawal symptoms. Alcohol withdrawal can be dangerous and, in severe cases, cause  death. Detoxification is usually provided in a hospital or in-patient substance use treatment facility.  Counseling or talk therapy. Talk therapy is provided by substance use treatment counselors. It addresses the reasons people use alcohol and ways to keep them from drinking again. The goals of talk therapy are to help people with alcohol use disorder find healthy activities and ways to cope with life stress, to identify and avoid triggers for alcohol use, and to handle cravings, which can cause relapse.  Medicines.Different medicines can help treat alcohol use disorder through the following actions:  Decrease alcohol cravings.  Decrease the positive reward response felt from alcohol use.  Produce an uncomfortable physical reaction when alcohol is used (aversion therapy).  Support groups. Support groups are run by people who have quit drinking. They provide emotional support, advice, and guidance. These forms of treatment are often combined. Some people with alcohol use disorder benefit from intensive combination treatment provided by specialized substance use treatment centers. Both inpatient and outpatient treatment programs are available. Document Released: 04/11/2004 Document Revised: 07/19/2013 Document Reviewed: 06/11/2012 Exit[employer] Patient Information 2015 [employer]. This information is not intended to replace advice given to you by your health care provider. Make sure you discuss any questions you have with your health care provider.  

## 2014-09-10 NOTE — ED Notes (Signed)
EDP at bedside  

## 2014-09-10 NOTE — ED Notes (Signed)
Pt arrives from home via [facility]. Pt had a witnessed seizure of less than a minute with tonic clonic movements involving the arms and legs. Family reports he is not taking his seizure medications. The meds received by EMS were filled in Jan 2016 and there were no seizure meds present. Pt also drinks about 1 40 oz. Malt liquor beverage daily. Pt was post ictal upon EMS arrival and was uncooperative. Pt has bilateral hand abrasions and a laceration over his left eyebrow. Pt had no tongue injury, but it appears he did bite his lip.

## 2014-09-10 NOTE — ED Notes (Signed)
Pt is in stable condition upon d/c and wishes to ambulate from ED.

## 2014-09-10 NOTE — ED Provider Notes (Addendum)
CSN: [id number]     Arrival date & time 09/10/14  1035 History   First MD Initiated Contact with Patient 09/10/14 1043     Chief Complaint  Patient presents with  . Seizures     (Consider location/radiation/quality/duration/timing/severity/associated sxs/prior Treatment) HPI Comments: Also patient has a history of Crohn's disease and states that he is currently taking prednisone because over the last several days he's had worsening abdominal pain in 2-3 episodes of vomiting a day with a lot of associating gagging. Anytime he eats the pain is worse. He denies fever or blood in stool. He is currently not taking any medication for nausea.  Patient is a 34 y.o. male presenting with seizures. The history is provided by the patient and the EMS personnel.  Seizures Seizure activity on arrival: no   Seizure type:  Grand mal Preceding symptoms: aura   Initial focality:  Diffuse Episode characteristics: generalized shaking and unresponsiveness   Episode characteristics: no tongue biting   Postictal symptoms: confusion   Postictal symptoms comment:  Combative Return to baseline: yes   Severity:  Moderate Duration:  3 minutes Timing:  Once Number of seizures this episode:  1 Progression:  Resolved Context: alcohol withdrawal   Context: not drug use   Context comment:  Normally drinks at least 2 beers a day and last had alcohol 2 days ago Recent head injury:  No recent head injuries PTA treatment:  None History of seizures: yes   Seizure control level:  Uncontrolled Current therapy:  None (pt states they told him to stop taking seizure meds.  thought that seizures most likely caused by alcohol)   Past Medical History  Diagnosis Date  . Crohn's disease dx'd 20  . Rectal ulcer   . Iron deficiency anemia   . Alcohol abuse   . Tobacco abuse   . Severe malnutrition   . Seizure   . Hepatic steatosis    Past Surgical History  Procedure Laterality Date  . Hernia repair      "stomach"   . Colostomy    . Partial colectomy    . Colostomy reversal    . Flexible sigmoidoscopy N/A 11/27/2013    Procedure: FLEXIBLE SIGMOIDOSCOPY;  Surgeon: Beryle Beams, MD;  Location: [facility];  Service: Endoscopy;  Laterality: N/A;   Family History  Problem Relation Age of Onset  . Breast cancer Mother 21   History  Substance Use Topics  . Smoking status: Current Every Day Smoker -- 0.25 packs/day for 23 years    Types: Cigarettes  . Smokeless tobacco: Never Used     Comment: smoking 8 cigarettes/day   . Alcohol Use: 1.2 oz/week    2 Shots of liquor per week     Comment: last drink last night     Review of Systems  Neurological: Positive for seizures.  All other systems reviewed and are negative.     Allergies  Ibuprofen and Tramadol  Home Medications   Prior to Admission medications   Medication Sig Start Date End Date Taking? Authorizing Provider  amoxicillin-clavulanate (AUGMENTIN) 875-125 MG per tablet Take 1 tablet by mouth every 12 (twelve) hours. 04/15/14   Belkys A Regalado, MD  azaTHIOprine (IMURAN) 50 MG tablet Take 1 tablet (50 mg total) by mouth daily. 11/29/13   Bobby Rumpf York, PA-C  chlordiazePOXIDE (LIBRIUM) 10 MG capsule Take 1 tablet TID for 3 days, then take 1 tablet BID  for 3 days then Take half tablet for 3 days. 04/15/14  Belkys A Regalado, MD  ferrous sulfate 325 (65 FE) MG tablet Take 1 tablet (325 mg total) by mouth daily with breakfast. 04/15/14   Belkys A Regalado, MD  folic acid (FOLVITE) 1 MG tablet Take 1 tablet (1 mg total) by mouth daily. 04/15/14   Belkys A Regalado, MD  Hydrocortisone Ace-Pramoxine 1.85-1.15 % CREA Apply to rectum TID 12/03/13   Thurnell Lose, MD  magnesium oxide (MAG-OX) 400 (241.3 MG) MG tablet Take 1 tablet (400 mg total) by mouth daily. 04/15/14   Belkys A Regalado, MD  Multiple Vitamin (MULTIVITAMIN WITH MINERALS) TABS tablet Take 1 tablet by mouth daily. 04/15/14   Belkys A Regalado, MD  oxyCODONE (OXY IR/ROXICODONE)  5 MG immediate release tablet Take 1 tablet (5 mg total) by mouth every 6 (six) hours as needed for moderate pain. After 3 days decrease to 1 tablet as needed up to every 4 hours for pain. 04/15/14   Belkys A Regalado, MD  potassium chloride SA (K-DUR,KLOR-CON) 20 MEQ tablet Take 2 tablets (40 mEq total) by mouth daily. 04/15/14   Belkys A Regalado, MD  predniSONE (DELTASONE) 20 MG tablet Take 1 tablet (20 mg total) by mouth daily with breakfast. 04/15/14   Belkys A Regalado, MD  thiamine 100 MG tablet Take 1 tablet (100 mg total) by mouth daily. 10/23/13   Reyne Dumas, MD  traMADol (ULTRAM) 50 MG tablet Take 1 tablet (50 mg total) by mouth every 8 (eight) hours as needed. 04/19/14   Josalyn Funches, MD   BP 142/95 mmHg  Pulse 109  Resp 18  SpO2 100% Physical Exam  Constitutional: He is oriented to person, place, and time. He appears well-developed and well-nourished. No distress.  HENT:  Head: Normocephalic and atraumatic.    Mouth/Throat: Oropharynx is clear and moist. Mucous membranes are dry.  No tongue biting  Eyes: Conjunctivae and EOM are normal. Pupils are equal, round, and reactive to light.  Neck: Normal range of motion. Neck supple.  Cardiovascular: Regular rhythm and intact distal pulses.  Tachycardia present.   No murmur heard. Pulmonary/Chest: Effort normal and breath sounds normal. No respiratory distress. He has no wheezes. He has no rales.  Abdominal: Soft. He exhibits no distension. There is tenderness. There is no rebound and no guarding.  Diffuse tenderness throughout the abdomen. Multiple well-healed abdominal scars  Musculoskeletal: Normal range of motion. He exhibits no edema or tenderness.       Arms: Neurological: He is alert and oriented to person, place, and time.  Bilateral fine hand tremor and tongue fasciculations  Skin: Skin is warm and dry. No rash noted. No erythema.  Psychiatric: He has a normal mood and affect. His behavior is normal.  Nursing note and  vitals reviewed.   ED Course  Procedures (including critical care time) Labs Review Labs Reviewed  URINE RAPID DRUG SCREEN, HOSP PERFORMED - Abnormal; Notable for the following:    Tetrahydrocannabinol POSITIVE (*)    All other components within normal limits  CBC WITH DIFFERENTIAL/PLATELET - Abnormal; Notable for the following:    WBC 11.0 (*)    Hemoglobin 10.7 (*)    HCT 35.4 (*)    MCH 23.6 (*)    RDW 16.8 (*)    Neutrophils Relative % 86 (*)    Neutro Abs 9.3 (*)    Lymphocytes Relative 6 (*)    All other components within normal limits  COMPREHENSIVE METABOLIC PANEL - Abnormal; Notable for the following:    Chloride 100 (*)  CO2 18 (*)    Glucose, Bld 147 (*)    BUN <5 (*)    Total Protein 8.3 (*)    AST 157 (*)    ALT 88 (*)    Anion gap 19 (*)    All other components within normal limits  LIPASE, BLOOD - Abnormal; Notable for the following:    Lipase 19 (*)    All other components within normal limits  I-STAT CG4 LACTIC ACID, ED - Abnormal; Notable for the following:    Lactic Acid, Venous 8.55 (*)    All other components within normal limits  I-STAT CG4 LACTIC ACID, ED - Abnormal; Notable for the following:    Lactic Acid, Venous 2.43 (*)    All other components within normal limits  URINALYSIS, ROUTINE W REFLEX MICROSCOPIC (NOT AT [facility])    Imaging Review No results found.   EKG Interpretation None      MDM   Final diagnoses:  Alcohol withdrawal seizure, with unspecified complication  Chronic abdominal pain    Patient with a history of Crohn's disease, seizures, chronic alcohol use who presents today after a seizure. Patient states he no longer takes medication for his seizures because he was told to stop. He has not drank any alcohol in the last 2 days which most likely precipitated the seizure today. Patient is tremulous on exam and appears to be in withdrawal. He also states that his Crohn's colitis has been flaring. He is no longer taking Imuran  and only using prednisone. He denies any bloody stools but is having 2-3 episodes of vomiting daily in the last few days and diffuse abdominal pain.  Pressure is stable, mild tachycardia 109 and abrasions to the hands and face from the seizure which was reported to be tonic-clonic.  Patient denies any drug use but does admit to drinking every day normally and smoking cigarettes.  Concern for gradual Crohn's flare as well as a alcohol withdrawal induced seizure. Patient was given Ativan for tongue fasciculations and hand tremor.  IV fluids started.  Lactate is elevated at 8.5. UA, UDS, CBC, CMP, lipase pending. Patient given pain control.  2:27 PM Labs other than the elevated lactate, show elevation of LFTs slightly worse than prior. Normal lipase. Mild leukocytosis of 11,000. UDS positive for marijuana.  After 2 L of fluid lactate repeated this elevation could be due to recent seizure. Patient is still complaining of abdominal pain but has had no vomiting here.  3:24 PM Pt repeat lactate 2.4.  Pt tolerating po's.  No vomiting here.  Pt feeling better.  Mild tremor but feel safe for discharge.  Will give librium.  Blanchie Dessert, MD 09/10/14 1544  Blanchie Dessert, MD 09/10/14 [phone]

## 2014-09-12 ENCOUNTER — Encounter (HOSPITAL_COMMUNITY): Payer: Self-pay | Admitting: Emergency Medicine

## 2014-09-12 ENCOUNTER — Emergency Department (HOSPITAL_COMMUNITY): Payer: Self-pay

## 2014-09-12 ENCOUNTER — Emergency Department (HOSPITAL_COMMUNITY)
Admission: EM | Admit: 2014-09-12 | Discharge: 2014-09-12 | Disposition: A | Discharge status: Home / Self Care | Payer: Self-pay | Attending: Emergency Medicine | Admitting: Emergency Medicine

## 2014-09-12 DIAGNOSIS — W1830XA Fall on same level, unspecified, initial encounter: Secondary | ICD-10-CM | POA: Insufficient documentation

## 2014-09-12 DIAGNOSIS — S60221A Contusion of right hand, initial encounter: Secondary | ICD-10-CM | POA: Insufficient documentation

## 2014-09-12 DIAGNOSIS — Y929 Unspecified place or not applicable: Secondary | ICD-10-CM | POA: Insufficient documentation

## 2014-09-12 DIAGNOSIS — L24A9 Irritant contact dermatitis due friction or contact with other specified body fluids: Secondary | ICD-10-CM

## 2014-09-12 DIAGNOSIS — L988 Other specified disorders of the skin and subcutaneous tissue: Secondary | ICD-10-CM | POA: Insufficient documentation

## 2014-09-12 DIAGNOSIS — S40012A Contusion of left shoulder, initial encounter: Secondary | ICD-10-CM | POA: Insufficient documentation

## 2014-09-12 DIAGNOSIS — Y939 Activity, unspecified: Secondary | ICD-10-CM | POA: Insufficient documentation

## 2014-09-12 DIAGNOSIS — Y999 Unspecified external cause status: Secondary | ICD-10-CM | POA: Insufficient documentation

## 2014-09-12 DIAGNOSIS — Z720 Tobacco use: Secondary | ICD-10-CM | POA: Insufficient documentation

## 2014-09-12 DIAGNOSIS — Z7952 Long term (current) use of systemic steroids: Secondary | ICD-10-CM | POA: Insufficient documentation

## 2014-09-12 DIAGNOSIS — S42122A Displaced fracture of acromial process, left shoulder, initial encounter for closed fracture: Secondary | ICD-10-CM | POA: Insufficient documentation

## 2014-09-12 DIAGNOSIS — T148XXA Other injury of unspecified body region, initial encounter: Secondary | ICD-10-CM

## 2014-09-12 LAB — CBC WITH DIFFERENTIAL/PLATELET
BASOS PCT: 1 % (ref 0–1)
Basophils Absolute: 0.1 10*3/uL (ref 0.0–0.1)
Eosinophils Absolute: 0.1 10*3/uL (ref 0.0–0.7)
Eosinophils Relative: 1 % (ref 0–5)
HCT: 28.5 % — ABNORMAL LOW (ref 39.0–52.0)
Hemoglobin: 8.7 g/dL — ABNORMAL LOW (ref 13.0–17.0)
LYMPHS ABS: 1.6 10*3/uL (ref 0.7–4.0)
Lymphocytes Relative: 15 % (ref 12–46)
MCH: 23.9 pg — AB (ref 26.0–34.0)
MCHC: 30.5 g/dL (ref 30.0–36.0)
MCV: 78.3 fL (ref 78.0–100.0)
MONO ABS: 1.2 10*3/uL — AB (ref 0.1–1.0)
Monocytes Relative: 11 % (ref 3–12)
Neutro Abs: 7.9 10*3/uL — ABNORMAL HIGH (ref 1.7–7.7)
Neutrophils Relative %: 72 % (ref 43–77)
PLATELETS: 404 10*3/uL — AB (ref 150–400)
RBC: 3.64 MIL/uL — ABNORMAL LOW (ref 4.22–5.81)
RDW: 17.1 % — ABNORMAL HIGH (ref 11.5–15.5)
WBC: 10.9 10*3/uL — ABNORMAL HIGH (ref 4.0–10.5)

## 2014-09-12 LAB — URINALYSIS, ROUTINE W REFLEX MICROSCOPIC
BILIRUBIN URINE: NEGATIVE
GLUCOSE, UA: NEGATIVE mg/dL
Hgb urine dipstick: NEGATIVE
KETONES UR: NEGATIVE mg/dL
Leukocytes, UA: NEGATIVE
NITRITE: NEGATIVE
PH: 5.5 (ref 5.0–8.0)
Protein, ur: 30 mg/dL — AB
Specific Gravity, Urine: 1.031 — ABNORMAL HIGH (ref 1.005–1.030)
Urobilinogen, UA: 0.2 mg/dL (ref 0.0–1.0)

## 2014-09-12 LAB — COMPREHENSIVE METABOLIC PANEL
ALT: 56 U/L (ref 17–63)
ANION GAP: 11 (ref 5–15)
AST: 105 U/L — ABNORMAL HIGH (ref 15–41)
Albumin: 3.5 g/dL (ref 3.5–5.0)
Alkaline Phosphatase: 97 U/L (ref 38–126)
BUN: 12 mg/dL (ref 6–20)
CALCIUM: 9 mg/dL (ref 8.9–10.3)
CO2: 21 mmol/L — ABNORMAL LOW (ref 22–32)
Chloride: 106 mmol/L (ref 101–111)
Creatinine, Ser: 0.98 mg/dL (ref 0.61–1.24)
GFR calc non Af Amer: >60 mL/min (ref >60)
Glucose, Bld: 92 mg/dL (ref 65–99)
Potassium: 3.3 mmol/L — ABNORMAL LOW (ref 3.5–5.1)
SODIUM: 138 mmol/L (ref 135–145)
TOTAL PROTEIN: 6.8 g/dL (ref 6.5–8.1)
Total Bilirubin: 0.3 mg/dL (ref 0.3–1.2)

## 2014-09-12 LAB — URINE MICROSCOPIC-ADD ON

## 2014-09-12 LAB — LIPASE, BLOOD: Lipase: 21 U/L — ABNORMAL LOW (ref 22–51)

## 2014-09-12 MED ORDER — OXYCODONE-ACETAMINOPHEN 5-325 MG PO TABS
1.0000 | ORAL_TABLET | Freq: Once | ORAL | Status: AC
  Administered 2014-09-12: 1 via ORAL
  Filled 2014-09-12: qty 1

## 2014-09-12 MED ORDER — LEVETIRACETAM 500 MG PO TABS
500.0000 mg | ORAL_TABLET | Freq: Once | ORAL | Status: AC
  Administered 2014-09-12: 500 mg via ORAL
  Filled 2014-09-12: qty 1

## 2014-09-12 MED ORDER — GI COCKTAIL ~~LOC~~
30.0000 mL | Freq: Once | ORAL | Status: AC
  Administered 2014-09-12: 30 mL via ORAL

## 2014-09-12 MED ORDER — METHOCARBAMOL 500 MG PO TABS
1000.0000 mg | ORAL_TABLET | Freq: Once | ORAL | Status: AC
  Administered 2014-09-12: 1000 mg via ORAL
  Filled 2014-09-12: qty 2

## 2014-09-12 NOTE — ED Notes (Signed)
Pt states he was seen in ED 2 days ago for seizure.  Reports when he had a seizure he fell on L shoulder/L upper arm.  C/o pain to L upper arm and bruising to L shoulder.  Also reports pain to R side of abd.  States he has a crohn's flare-up.

## 2014-09-12 NOTE — ED Notes (Signed)
Pt requesting pain medication.  

## 2014-09-12 NOTE — ED Provider Notes (Signed)
CSN: [id number]     Arrival date & time 09/12/14  0128 History  This chart was scribed for Benjamin Handel, MD by Randa Evens, ED Scribe. This patient was seen in room D35C/D35C and the patient's care was started at 2:08 AM.    Chief Complaint  Patient presents with  . Arm Pain  . Abdominal Pain   Patient is a 34 y.o. male presenting with arm injury. The history is provided by the patient. No language interpreter was used.  Arm Injury Location:  Arm Arm location:  L arm and L upper arm Pain details:    Quality:  Aching   Radiates to:  Does not radiate   Severity:  Mild   Onset quality:  Sudden   Duration:  2 days   Timing:  Constant   Progression:  Unchanged Chronicity:  New Dislocation: no   Tetanus status:  Up to date Relieved by:  None tried Worsened by:  Nothing tried Ineffective treatments:  None tried Associated symptoms: swelling   Associated symptoms: no back pain, no fever, no neck pain, no numbness and no tingling   Risk factors: no concern for non-accidental trauma and no frequent fractures    HPI Comments: Benjamin Shelton is a 34 y.o. male who presents to the Emergency Department complaining of left arm pain onset 2 days ago prior after having a seizure. Pt is also complaining of right hand pain with swelling. Pt states that the shoulder pain is worse with movement. Was given RX for pain but has not filled them.   Pt doesn't report any medications PTA. Pt doesn't report any other seizures. Pt doesn't report any other symptoms.   Past Medical History  Diagnosis Date  . Crohn's disease dx'd 30  . Rectal ulcer   . Iron deficiency anemia   . Alcohol abuse   . Tobacco abuse   . Severe malnutrition   . Seizure   . Hepatic steatosis    Past Surgical History  Procedure Laterality Date  . Hernia repair      "stomach"  . Colostomy    . Partial colectomy    . Colostomy reversal    . Flexible sigmoidoscopy N/A 11/27/2013    Procedure: FLEXIBLE SIGMOIDOSCOPY;   Surgeon: Beryle Beams, MD;  Location: [place];  Service: Endoscopy;  Laterality: N/A;   Family History  Problem Relation Age of Onset  . Breast cancer Mother 59   History  Substance Use Topics  . Smoking status: Current Every Day Smoker -- 0.25 packs/day for 23 years    Types: Cigarettes  . Smokeless tobacco: Never Used     Comment: smoking 8 cigarettes/day   . Alcohol Use: 1.2 oz/week    2 Shots of liquor per week     Comment: last drink last night     Review of Systems  Constitutional: Negative for fever.  Musculoskeletal: Positive for joint swelling and arthralgias. Negative for back pain, neck pain and neck stiffness.  Neurological: Negative for seizures, syncope, numbness and headaches.  All other systems reviewed and are negative.     Allergies  Ibuprofen and Tramadol  Home Medications   Prior to Admission medications   Medication Sig Start Date End Date Taking? Authorizing Provider  amoxicillin-clavulanate (AUGMENTIN) 875-125 MG per tablet Take 1 tablet by mouth every 12 (twelve) hours. Patient not taking: Reported on 09/10/2014 04/15/14   Belkys A Regalado, MD  azaTHIOprine (IMURAN) 50 MG tablet Take 1 tablet (50 mg total) by mouth daily.  Patient not taking: Reported on 09/10/2014 11/29/13   Melton Alar, PA-C  chlordiazePOXIDE (LIBRIUM) 10 MG capsule Take 1 tablet TID for 3 days, then take 1 tablet BID  for 3 days then Take half tablet for 3 days. 09/10/14   Blanchie Dessert, MD  ferrous sulfate 325 (65 FE) MG tablet Take 1 tablet (325 mg total) by mouth daily with breakfast. Patient not taking: Reported on 09/10/2014 04/15/14   Belkys A Regalado, MD  folic acid (FOLVITE) 1 MG tablet Take 1 tablet (1 mg total) by mouth daily. Patient not taking: Reported on 09/10/2014 04/15/14   Elmarie Shiley, MD  Hydrocortisone Ace-Pramoxine 1.85-1.15 % CREA Apply to rectum TID Patient not taking: Reported on 09/10/2014 12/03/13   Thurnell Lose, MD  magnesium oxide (MAG-OX)  400 (241.3 MG) MG tablet Take 1 tablet (400 mg total) by mouth daily. Patient not taking: Reported on 09/10/2014 04/15/14   Elmarie Shiley, MD  Multiple Vitamin (MULTIVITAMIN WITH MINERALS) TABS tablet Take 1 tablet by mouth daily. Patient not taking: Reported on 09/10/2014 04/15/14   Belkys A Regalado, MD  oxyCODONE (OXY IR/ROXICODONE) 5 MG immediate release tablet Take 1 tablet (5 mg total) by mouth every 6 (six) hours as needed for moderate pain or severe pain. After 3 days decrease to 1 tablet as needed up to every 4 hours for pain. 09/10/14   Blanchie Dessert, MD  potassium chloride SA (K-DUR,KLOR-CON) 20 MEQ tablet Take 2 tablets (40 mEq total) by mouth daily. Patient not taking: Reported on 09/10/2014 04/15/14   Belkys A Regalado, MD  predniSONE (DELTASONE) 10 MG tablet Take 10 mg by mouth 2 (two) times daily with a meal.    Historical Provider, MD  predniSONE (DELTASONE) 20 MG tablet Take 1 tablet (20 mg total) by mouth daily with breakfast. Patient not taking: Reported on 09/10/2014 04/15/14   Belkys A Regalado, MD  promethazine (PHENERGAN) 25 MG tablet Take 1 tablet (25 mg total) by mouth every 6 (six) hours as needed for nausea or vomiting. 09/10/14   Blanchie Dessert, MD  thiamine 100 MG tablet Take 1 tablet (100 mg total) by mouth daily. Patient not taking: Reported on 09/10/2014 10/23/13   Reyne Dumas, MD  traMADol (ULTRAM) 50 MG tablet Take 1 tablet (50 mg total) by mouth every 8 (eight) hours as needed. Patient not taking: Reported on 09/10/2014 04/19/14   Josalyn Funches, MD   BP 146/85 mmHg  Pulse 105  Temp(Src) 98.2 F (36.8 C) (Oral)  Resp 16  Ht 5\' 3"  (1.6 m)  Wt 119 lb 14.4 oz (54.386 kg)  BMI 21.24 kg/m2  SpO2 100%   Physical Exam  Constitutional: He is oriented to person, place, and time. He appears well-developed and well-nourished. No distress.  HENT:  Head: Normocephalic and atraumatic.  Right Ear: External ear normal.  Left Ear: External ear normal.  Mouth/Throat:  Oropharynx is clear and moist. No oropharyngeal exudate.  Eyes: Conjunctivae and EOM are normal. Pupils are equal, round, and reactive to light.  Neck: Normal range of motion. Neck supple. No tracheal deviation present.  Cardiovascular: Normal rate, regular rhythm and intact distal pulses.   Pulmonary/Chest: Effort normal and breath sounds normal. No respiratory distress. He has no wheezes. He has no rales.  Abdominal: Soft. Bowel sounds are normal. There is no tenderness. There is no rebound and no guarding.  Musculoskeletal: Normal range of motion. He exhibits tenderness.       Left shoulder: He exhibits tenderness. He  exhibits no swelling, no effusion, no crepitus, no deformity, no laceration, no pain, no spasm, normal pulse and normal strength.       Left elbow: He exhibits normal range of motion, no swelling, no effusion, no deformity and no laceration. No tenderness found. No radial head, no medial epicondyle, no lateral epicondyle and no olecranon process tenderness noted.       Left forearm: He exhibits no tenderness, no bony tenderness, no swelling, no edema, no deformity and no laceration.  Bruise over right 3rd knuckle, NVI, cap refill less than 2 seconds, normal ROM. Ecchymosis of left shoulder, shoulder well seated, 3+ radial pulse, DTR intact, intact clavicle, good strength on left shoulder   Neurological: He is alert and oriented to person, place, and time.  Skin: Skin is warm and dry.  Psychiatric: He has a normal mood and affect. His behavior is normal.  Nursing note and vitals reviewed.   ED Course  Procedures (including critical care time) DIAGNOSTIC STUDIES: Oxygen Saturation is 100% on RA, normal by my interpretation.    COORDINATION OF CARE: 2:37 AM-Discussed treatment plan with pt at bedside and pt agreed to plan.    Labs Review Labs Reviewed  CBC WITH DIFFERENTIAL/PLATELET  COMPREHENSIVE METABOLIC PANEL  LIPASE, BLOOD  URINALYSIS, ROUTINE W REFLEX MICROSCOPIC  (NOT AT [facility])    Imaging Review No results found.   EKG Interpretation None      MDM   Final diagnoses:  Wound drainage   acromial fracture from seizure 2 days ago.  Fill your pain medication, sling and follow up with orthopedics.      I personally performed the services described in this documentation, which was scribed in my presence. The recorded information has been reviewed and is accurate.     Veatrice Kells, MD 09/12/14 [phone]

## 2014-09-12 NOTE — ED Notes (Signed)
Patient transported to X-ray 

## 2014-09-28 ENCOUNTER — Telehealth: Payer: Self-pay | Admitting: General Practice

## 2014-09-28 DIAGNOSIS — Z9114 Patient's other noncompliance with medication regimen: Secondary | ICD-10-CM

## 2014-09-28 NOTE — Telephone Encounter (Signed)
Volunteer, Doris called patient to confirm appointment for 09/30/14 ... Doris states she was informed by family member that confirmed appt, that patient refuses to take any of the medications that he was prescribed and wants to make clinical staff aware.

## 2014-09-29 DIAGNOSIS — Z9114 Patient's other noncompliance with medication regimen: Secondary | ICD-10-CM | POA: Insufficient documentation

## 2014-09-29 NOTE — Telephone Encounter (Signed)
Noted  

## 2014-09-29 NOTE — Telephone Encounter (Signed)
Non compliance added to problem list

## 2014-09-30 ENCOUNTER — Telehealth: Payer: Self-pay | Admitting: Family Medicine

## 2014-09-30 ENCOUNTER — Emergency Department (HOSPITAL_COMMUNITY)
Admission: EM | Admit: 2014-09-30 | Discharge: 2014-10-01 | Disposition: A | Discharge status: Home / Self Care | Payer: Self-pay | Attending: Emergency Medicine | Admitting: Emergency Medicine

## 2014-09-30 ENCOUNTER — Encounter (HOSPITAL_COMMUNITY): Payer: Self-pay | Admitting: Emergency Medicine

## 2014-09-30 ENCOUNTER — Ambulatory Visit: Payer: Self-pay | Admitting: Family Medicine

## 2014-09-30 DIAGNOSIS — R000 Tachycardia, unspecified: Secondary | ICD-10-CM | POA: Insufficient documentation

## 2014-09-30 DIAGNOSIS — R112 Nausea with vomiting, unspecified: Secondary | ICD-10-CM | POA: Insufficient documentation

## 2014-09-30 DIAGNOSIS — Z8719 Personal history of other diseases of the digestive system: Secondary | ICD-10-CM | POA: Insufficient documentation

## 2014-09-30 DIAGNOSIS — F1010 Alcohol abuse, uncomplicated: Secondary | ICD-10-CM

## 2014-09-30 DIAGNOSIS — R109 Unspecified abdominal pain: Secondary | ICD-10-CM | POA: Insufficient documentation

## 2014-09-30 DIAGNOSIS — Z7952 Long term (current) use of systemic steroids: Secondary | ICD-10-CM | POA: Insufficient documentation

## 2014-09-30 DIAGNOSIS — Z862 Personal history of diseases of the blood and blood-forming organs and certain disorders involving the immune mechanism: Secondary | ICD-10-CM | POA: Insufficient documentation

## 2014-09-30 DIAGNOSIS — Z8639 Personal history of other endocrine, nutritional and metabolic disease: Secondary | ICD-10-CM | POA: Insufficient documentation

## 2014-09-30 DIAGNOSIS — F10120 Alcohol abuse with intoxication, uncomplicated: Secondary | ICD-10-CM | POA: Insufficient documentation

## 2014-09-30 DIAGNOSIS — F1210 Cannabis abuse, uncomplicated: Secondary | ICD-10-CM | POA: Insufficient documentation

## 2014-09-30 DIAGNOSIS — G8929 Other chronic pain: Secondary | ICD-10-CM | POA: Insufficient documentation

## 2014-09-30 DIAGNOSIS — Z720 Tobacco use: Secondary | ICD-10-CM | POA: Insufficient documentation

## 2014-09-30 LAB — COMPREHENSIVE METABOLIC PANEL
ALBUMIN: 3.6 g/dL (ref 3.5–5.0)
ALK PHOS: 111 U/L (ref 38–126)
ALT: 58 U/L (ref 17–63)
AST: 121 U/L — AB (ref 15–41)
Anion gap: 13 (ref 5–15)
BUN: <5 mg/dL — ABNORMAL LOW (ref 6–20)
CO2: 22 mmol/L (ref 22–32)
CREATININE: 0.8 mg/dL (ref 0.61–1.24)
Calcium: 8.8 mg/dL — ABNORMAL LOW (ref 8.9–10.3)
Chloride: 103 mmol/L (ref 101–111)
GFR calc Af Amer: >60 mL/min (ref >60)
GFR calc non Af Amer: >60 mL/min (ref >60)
Glucose, Bld: 75 mg/dL (ref 65–99)
Potassium: 4.2 mmol/L (ref 3.5–5.1)
Sodium: 138 mmol/L (ref 135–145)
Total Bilirubin: 0.1 mg/dL — ABNORMAL LOW (ref 0.3–1.2)
Total Protein: 7.5 g/dL (ref 6.5–8.1)

## 2014-09-30 LAB — URINALYSIS, ROUTINE W REFLEX MICROSCOPIC
BILIRUBIN URINE: NEGATIVE
Glucose, UA: NEGATIVE mg/dL
Hgb urine dipstick: NEGATIVE
Ketones, ur: NEGATIVE mg/dL
Leukocytes, UA: NEGATIVE
Nitrite: NEGATIVE
Protein, ur: NEGATIVE mg/dL
Specific Gravity, Urine: 1.003 — ABNORMAL LOW (ref 1.005–1.030)
UROBILINOGEN UA: 0.2 mg/dL (ref 0.0–1.0)
pH: 5.5 (ref 5.0–8.0)

## 2014-09-30 LAB — RAPID URINE DRUG SCREEN, HOSP PERFORMED
AMPHETAMINES: NOT DETECTED
BARBITURATES: NOT DETECTED
Benzodiazepines: NOT DETECTED
Cocaine: NOT DETECTED
OPIATES: NOT DETECTED
Tetrahydrocannabinol: POSITIVE — AB

## 2014-09-30 LAB — CBC
HCT: 31.7 % — ABNORMAL LOW (ref 39.0–52.0)
HEMOGLOBIN: 9.6 g/dL — AB (ref 13.0–17.0)
MCH: 22.2 pg — ABNORMAL LOW (ref 26.0–34.0)
MCHC: 30.3 g/dL (ref 30.0–36.0)
MCV: 73.4 fL — AB (ref 78.0–100.0)
Platelets: 594 10*3/uL — ABNORMAL HIGH (ref 150–400)
RBC: 4.32 MIL/uL (ref 4.22–5.81)
RDW: 16.8 % — ABNORMAL HIGH (ref 11.5–15.5)
WBC: 9.5 10*3/uL (ref 4.0–10.5)

## 2014-09-30 LAB — LIPASE, BLOOD: LIPASE: 32 U/L (ref 22–51)

## 2014-09-30 LAB — ETHANOL: Alcohol, Ethyl (B): 322 mg/dL (ref <5)

## 2014-09-30 MED ORDER — ONDANSETRON HCL 4 MG/2ML IJ SOLN
4.0000 mg | Freq: Once | INTRAMUSCULAR | Status: AC
  Administered 2014-09-30: 4 mg via INTRAVENOUS
  Filled 2014-09-30: qty 2

## 2014-09-30 MED ORDER — SODIUM CHLORIDE 0.9 % IV BOLUS (SEPSIS)
1000.0000 mL | Freq: Once | INTRAVENOUS | Status: AC
  Administered 2014-09-30: 1000 mL via INTRAVENOUS

## 2014-09-30 MED ORDER — SODIUM CHLORIDE 0.9 % IV SOLN
1.0000 mg | Freq: Once | INTRAVENOUS | Status: AC
  Administered 2014-10-01: 1 mg via INTRAVENOUS
  Filled 2014-09-30: qty 0.2

## 2014-09-30 MED ORDER — THIAMINE HCL 100 MG/ML IJ SOLN
100.0000 mg | Freq: Once | INTRAMUSCULAR | Status: AC
  Administered 2014-09-30: 100 mg via INTRAVENOUS
  Filled 2014-09-30: qty 2

## 2014-09-30 MED ORDER — LORAZEPAM 2 MG/ML IJ SOLN
1.0000 mg | Freq: Once | INTRAMUSCULAR | Status: AC
  Administered 2014-09-30: 1 mg via INTRAVENOUS
  Filled 2014-09-30: qty 1

## 2014-09-30 NOTE — ED Provider Notes (Signed)
TIME SEEN: 11:25 PM  CHIEF COMPLAINT: Abdominal Pain  HPI:  HPI Comments: Benjamin Shelton is a 34 y.o. male with hx Crohn's disease, seizures, and alcohol abuse who presents to the Emergency Department complaining of chronic, diffuse, abdominal pain. Pt also complains of nausea and vomiting. He went to check himself in to [facility] for detox earlier today and states his BP and blood alcohol content was high. Pt was told to come to the ED for medical clearance prior to [facility] accepting him. He mentions that he usually drinks 3-4 40's per day. The last time he had a drink was around 8 PM tonight. Pt mentions that he has hx of seizures and has not had his seizure medication int he last 3 days.  His knee states he has not been taking any of his medications in several months. He is unable to tell me what he takes for his seizures. Pt denies SI, HI, visual or auditory hallucinations. Denies fever, chills, diarrhea, or any other symptoms. Pt has had multiple abdominal surgeries including hernia repair and bowel resection.    ROS: See HPI Constitutional: no fever  Eyes: no drainage  ENT: no runny nose   Cardiovascular:  no chest pain  Resp: no SOB  GI: nausea, vomiting, diffuse abdominal pain. No diarrhea GU: no dysuria Integumentary: no rash  Allergy: no hives  Musculoskeletal: no leg swelling  Neurological: no slurred speech ROS otherwise negative  PAST MEDICAL HISTORY/PAST SURGICAL HISTORY:  Past Medical History  Diagnosis Date  . Crohn's disease dx'd 39  . Rectal ulcer   . Iron deficiency anemia   . Alcohol abuse   . Tobacco abuse   . Severe malnutrition   . Seizure   . Hepatic steatosis     MEDICATIONS:  Prior to Admission medications   Medication Sig Start Date End Date Taking? Authorizing Provider  oxyCODONE (OXY IR/ROXICODONE) 5 MG immediate release tablet Take 1 tablet (5 mg total) by mouth every 6 (six) hours as needed for moderate pain or severe pain. After 3 days  decrease to 1 tablet as needed up to every 4 hours for pain. 09/10/14   Blanchie Dessert, MD  predniSONE (DELTASONE) 10 MG tablet Take 10 mg by mouth 2 (two) times daily with a meal.    Historical Provider, MD  promethazine (PHENERGAN) 25 MG tablet Take 1 tablet (25 mg total) by mouth every 6 (six) hours as needed for nausea or vomiting. 09/10/14   Blanchie Dessert, MD    ALLERGIES:  Allergies  Allergen Reactions  . Aspirin Hives  . Ibuprofen Itching  . Tramadol Other (See Comments)    irritates chrons     SOCIAL HISTORY:  History  Substance Use Topics  . Smoking status: Current Every Day Smoker -- 0.25 packs/day for 23 years    Types: Cigarettes  . Smokeless tobacco: Never Used     Comment: smoking 8 cigarettes/day   . Alcohol Use: 1.2 oz/week    2 Shots of liquor per week    FAMILY HISTORY: Family History  Problem Relation Age of Onset  . Breast cancer Mother 76    EXAM: Triage Vitals: BP 117/59 mmHg  Pulse 109  Temp(Src) 97.9 F (36.6 C) (Oral)  Resp 19  SpO2 100%   CONSTITUTIONAL: Alert and oriented and responds appropriately to questions. Chronically ill appearing but in no distress and non toxic. Smells of EtOH.  HEAD: Normocephalic EYES: Conjunctivae clear, PERRL ENT: normal nose; no rhinorrhea; moist mucous membranes; pharynx without lesions  noted NECK: Supple, no meningismus, no LAD  CARD: Regular rhythm and tachycardic; S1 and S2 appreciated; no murmurs, no clicks, no rubs, no gallops RESP: Normal chest excursion without splinting or tachypnea; breath sounds clear and equal bilaterally; no wheezes, no rhonchi, no rales, no hypoxia or respiratory distress, speaking full sentences ABD/GI: Normal bowel sounds; non-distended; soft, when distracted pt has benign abdominal exam otherwise complains of diffuse pain with palpation, no rebound, no guarding, no peritoneal signs. Multiple surgical scars on abdomen BACK:  The back appears normal and is non-tender to  palpation, there is no CVA tenderness EXT: Normal ROM in all joints; non-tender to palpation; no edema; normal capillary refill; no cyanosis, no calf tenderness or swelling    SKIN: Normal color for age and race; warm NEURO: Moves all extremities equally, sensation to light touch intact diffusely, cranial nerves II through XII intact PSYCH: The patient's mood and manner are appropriate. Grooming and personal hygiene are appropriate. No SI, HI, or hallucinations  MEDICAL DECISION MAKING: Patient here with complaints of chronic abdominal pain, intoxication and wanting detox. Patient's niece had told nursing staff that he was suicidal the patient adamantly denies this and contracts for safety. His abdominal exam is benign when he is distracted. He is mildly tachycardic but afebrile. We'll obtain screening labs, urine. Will give IV fluids and reassess.  ED PROGRESS: Patient's labs unremarkable other than alcohol level greater than 300. Drug screen positive for THC.  Heart rate has improved with IV hydration. Patient has been monitored in the emergency department for over 6 hours. No vomiting. No fever. I feel he is safe to be discharged. Have asked patient if he would like for [place] to contact [facility] that he can go back there for alcohol detox but he states he would like to go home and will follow-up at [facility] on his own. Discussed return precautions and supportive care instructions. He verbalized understanding and is comfortable with plan.     EKG Interpretation  Date/Time:  Friday September 30 2014 23:43:01 EDT Ventricular Rate:  110 PR Interval:  136 QRS Duration: 95 QT Interval:  339 QTC Calculation: 459 R Axis:   82 Text Interpretation:  Sinus tachycardia ST elev, probable normal early repol pattern No significant change since last tracing Confirmed by Roberta Kelly,  DO, Brianni Manthe [phone]) on 09/30/2014 11:54:27 PM        I personally performed the services described in this documentation, which was  scribed in my presence. The recorded information has been reviewed and is accurate.      [place], DO 10/01/14 [phone]

## 2014-09-30 NOTE — ED Notes (Signed)
Pt c/o chronic RUQ pain that he states is from his Crohn's Disease.  Niece reports that she just took pt to [facility] for detox and they told her to bring him to ED due to high blood pressure and he blew 3.9 on breathalyzer.  Niece states that pt is suicidal.  Pt denies suicidal and homicidal ideation.   Pt states he does want detox from etoh.  Last etoh 8pm.

## 2014-09-30 NOTE — Telephone Encounter (Signed)
Patient's niece called to advise PCP about the patient not taking his medication as prescribed.  She also stated that the patient smokes marijuana and needs help going to a detox center for his ETOH abuse. She would like to speak to a nurse in regards to the patient. Please f/u.

## 2014-09-30 NOTE — ED Notes (Signed)
Security at bedside to wand pt. 

## 2014-10-01 ENCOUNTER — Emergency Department (HOSPITAL_COMMUNITY)
Admission: EM | Admit: 2014-10-01 | Discharge: 2014-10-01 | Disposition: A | Discharge status: Home / Self Care | Payer: Self-pay | Attending: Emergency Medicine | Admitting: Emergency Medicine

## 2014-10-01 ENCOUNTER — Encounter (HOSPITAL_COMMUNITY): Payer: Self-pay | Admitting: Nurse Practitioner

## 2014-10-01 DIAGNOSIS — G8929 Other chronic pain: Secondary | ICD-10-CM | POA: Insufficient documentation

## 2014-10-01 DIAGNOSIS — Z720 Tobacco use: Secondary | ICD-10-CM | POA: Insufficient documentation

## 2014-10-01 DIAGNOSIS — R569 Unspecified convulsions: Secondary | ICD-10-CM | POA: Insufficient documentation

## 2014-10-01 DIAGNOSIS — Z8719 Personal history of other diseases of the digestive system: Secondary | ICD-10-CM | POA: Insufficient documentation

## 2014-10-01 DIAGNOSIS — Z862 Personal history of diseases of the blood and blood-forming organs and certain disorders involving the immune mechanism: Secondary | ICD-10-CM | POA: Insufficient documentation

## 2014-10-01 DIAGNOSIS — R109 Unspecified abdominal pain: Secondary | ICD-10-CM | POA: Insufficient documentation

## 2014-10-01 DIAGNOSIS — F10120 Alcohol abuse with intoxication, uncomplicated: Secondary | ICD-10-CM | POA: Insufficient documentation

## 2014-10-01 DIAGNOSIS — F10920 Alcohol use, unspecified with intoxication, uncomplicated: Secondary | ICD-10-CM

## 2014-10-01 LAB — COMPREHENSIVE METABOLIC PANEL
ALT: 57 U/L (ref 17–63)
ANION GAP: 12 (ref 5–15)
AST: 139 U/L — ABNORMAL HIGH (ref 15–41)
Albumin: 3.4 g/dL — ABNORMAL LOW (ref 3.5–5.0)
Alkaline Phosphatase: 108 U/L (ref 38–126)
BILIRUBIN TOTAL: 0.5 mg/dL (ref 0.3–1.2)
BUN: <5 mg/dL — ABNORMAL LOW (ref 6–20)
CALCIUM: 8.3 mg/dL — AB (ref 8.9–10.3)
CO2: 20 mmol/L — ABNORMAL LOW (ref 22–32)
Chloride: 107 mmol/L (ref 101–111)
Creatinine, Ser: 0.8 mg/dL (ref 0.61–1.24)
GFR calc Af Amer: >60 mL/min (ref >60)
Glucose, Bld: 76 mg/dL (ref 65–99)
POTASSIUM: 4.4 mmol/L (ref 3.5–5.1)
SODIUM: 139 mmol/L (ref 135–145)
Total Protein: 6.9 g/dL (ref 6.5–8.1)

## 2014-10-01 LAB — LIPASE, BLOOD: Lipase: 25 U/L (ref 22–51)

## 2014-10-01 LAB — DIFFERENTIAL
Basophils Absolute: 0.1 10*3/uL (ref 0.0–0.1)
Basophils Relative: 1 % (ref 0–1)
EOS ABS: 0.2 10*3/uL (ref 0.0–0.7)
Eosinophils Relative: 2 % (ref 0–5)
LYMPHS ABS: 2 10*3/uL (ref 0.7–4.0)
Lymphocytes Relative: 15 % (ref 12–46)
MONO ABS: 1.5 10*3/uL — AB (ref 0.1–1.0)
MONOS PCT: 11 % (ref 3–12)
Neutro Abs: 9.8 10*3/uL — ABNORMAL HIGH (ref 1.7–7.7)
Neutrophils Relative %: 71 % (ref 43–77)

## 2014-10-01 LAB — CBC
HCT: 30.1 % — ABNORMAL LOW (ref 39.0–52.0)
Hemoglobin: 8.9 g/dL — ABNORMAL LOW (ref 13.0–17.0)
MCH: 22 pg — ABNORMAL LOW (ref 26.0–34.0)
MCHC: 29.6 g/dL — ABNORMAL LOW (ref 30.0–36.0)
MCV: 74.5 fL — ABNORMAL LOW (ref 78.0–100.0)
Platelets: 624 10*3/uL — ABNORMAL HIGH (ref 150–400)
RBC: 4.04 MIL/uL — AB (ref 4.22–5.81)
RDW: 17 % — AB (ref 11.5–15.5)
WBC: 13.9 10*3/uL — AB (ref 4.0–10.5)

## 2014-10-01 LAB — URINALYSIS, ROUTINE W REFLEX MICROSCOPIC
Bilirubin Urine: NEGATIVE
Glucose, UA: NEGATIVE mg/dL
Hgb urine dipstick: NEGATIVE
KETONES UR: NEGATIVE mg/dL
Leukocytes, UA: NEGATIVE
NITRITE: NEGATIVE
PH: 6 (ref 5.0–8.0)
PROTEIN: NEGATIVE mg/dL
Specific Gravity, Urine: 1.003 — ABNORMAL LOW (ref 1.005–1.030)
Urobilinogen, UA: 0.2 mg/dL (ref 0.0–1.0)

## 2014-10-01 LAB — ETHANOL: Alcohol, Ethyl (B): 226 mg/dL — ABNORMAL HIGH (ref <5)

## 2014-10-01 MED ORDER — ONDANSETRON HCL 4 MG/2ML IJ SOLN
4.0000 mg | Freq: Once | INTRAMUSCULAR | Status: AC
  Administered 2014-10-01: 4 mg via INTRAVENOUS
  Filled 2014-10-01: qty 2

## 2014-10-01 MED ORDER — LORAZEPAM 2 MG/ML IJ SOLN
0.0000 mg | Freq: Two times a day (BID) | INTRAMUSCULAR | Status: DC
Start: 2014-10-01 — End: 2014-10-01
  Administered 2014-10-01: 2 mg via INTRAVENOUS
  Filled 2014-10-01: qty 1

## 2014-10-01 MED ORDER — DICYCLOMINE HCL 10 MG/ML IM SOLN
20.0000 mg | Freq: Once | INTRAMUSCULAR | Status: AC
  Administered 2014-10-01: 20 mg via INTRAMUSCULAR
  Filled 2014-10-01: qty 2

## 2014-10-01 MED ORDER — LORAZEPAM 2 MG/ML IJ SOLN: 0.0000 mg | Freq: Four times a day (QID) | INTRAMUSCULAR | Status: DC

## 2014-10-01 MED ORDER — THIAMINE HCL 100 MG/ML IJ SOLN
Freq: Once | INTRAVENOUS | Status: AC
  Administered 2014-10-01: 14:00:00 via INTRAVENOUS
  Filled 2014-10-01: qty 1000

## 2014-10-01 NOTE — Discharge Instructions (Signed)
Alcohol Intoxication Alcohol intoxication occurs when the amount of alcohol that a person has consumed impairs his or her ability to mentally and physically function. Alcohol directly impairs the normal chemical activity of the brain. Drinking large amounts of alcohol can lead to changes in mental function and behavior, and it can cause many physical effects that can be harmful.  Alcohol intoxication can range in severity from mild to very severe. Various factors can affect the level of intoxication that occurs, such as the person's age, gender, weight, frequency of alcohol consumption, and the presence of other medical conditions (such as diabetes, seizures, or heart conditions). Dangerous levels of alcohol intoxication may occur when people drink large amounts of alcohol in a short period (binge drinking). Alcohol can also be especially dangerous when combined with certain prescription medicines or "recreational" drugs. SIGNS AND SYMPTOMS Some common signs and symptoms of mild alcohol intoxication include:  Loss of coordination.  Changes in mood and behavior.  Impaired judgment.  Slurred speech. As alcohol intoxication progresses to more severe levels, other signs and symptoms will appear. These may include:  Vomiting.  Confusion and impaired memory.  Slowed breathing.  Seizures.  Loss of consciousness. DIAGNOSIS  Your health care provider will take a medical history and perform a physical exam. You will be asked about the amount and type of alcohol you have consumed. Blood tests will be done to measure the concentration of alcohol in your blood. In many places, your blood alcohol level must be lower than 80 mg/dL (0.08%) to legally drive. However, many dangerous effects of alcohol can occur at much lower levels.  TREATMENT  People with alcohol intoxication often do not require treatment. Most of the effects of alcohol intoxication are temporary, and they go away as the alcohol naturally  leaves the body. Your health care provider will monitor your condition until you are stable enough to go home. Fluids are sometimes given through an IV access tube to help prevent dehydration.  HOME CARE INSTRUCTIONS  Do not drive after drinking alcohol.  Stay hydrated. Drink enough water and fluids to keep your urine clear or pale yellow. Avoid caffeine.   Only take over-the-counter or prescription medicines as directed by your health care provider.  SEEK MEDICAL CARE IF:   You have persistent vomiting.   You do not feel better after a few days.  You have frequent alcohol intoxication. Your health care provider can help determine if you should see a substance use treatment counselor. SEEK IMMEDIATE MEDICAL CARE IF:   You become shaky or tremble when you try to stop drinking.   You shake uncontrollably (seizure).   You throw up (vomit) blood. This may be bright red or may look like black coffee grounds.   You have blood in your stool. This may be bright red or may appear as a black, tarry, bad smelling stool.   You become lightheaded or faint.  MAKE SURE YOU:   Understand these instructions.  Will watch your condition.  Will get help right away if you are not doing well or get worse. Document Released: 12/12/2004 Document Revised: 11/04/2012 Document Reviewed: 08/07/2012 [facility] Patient Information 2015 [place], [place]. This information is not intended to replace advice given to you by your health care provider. Make sure you discuss any questions you have with your health care provider. Chronic Pain Chronic pain can be defined as pain that is off and on and lasts for 3-6 months or longer. Many things cause chronic pain, which  can make it difficult to make a diagnosis. There are many treatment options available for chronic pain. However, finding a treatment that works well for you may require trying various approaches until the right one is found. Many people benefit from  a combination of two or more types of treatment to control their pain. SYMPTOMS  Chronic pain can occur anywhere in the body and can range from mild to very severe. Some types of chronic pain include:  Headache.  Low back pain.  Cancer pain.  Arthritis pain.  Neurogenic pain. This is pain resulting from damage to nerves. People with chronic pain may also have other symptoms such as:  Depression.  Anger.  Insomnia.  Anxiety. DIAGNOSIS  Your health care provider will help diagnose your condition over time. In many cases, the initial focus will be on excluding possible conditions that could be causing the pain. Depending on your symptoms, your health care provider may order tests to diagnose your condition. Some of these tests may include:   Blood tests.   CT scan.   MRI.   X-rays.   Ultrasounds.   Nerve conduction studies.  You may need to see a specialist.  TREATMENT  Finding treatment that works well may take time. You may be referred to a pain specialist. He or she may prescribe medicine or therapies, such as:   Mindful meditation or yoga.  Shots (injections) of numbing or pain-relieving medicines into the spine or area of pain.  Local electrical stimulation.  Acupuncture.   Massage therapy.   Aroma, color, light, or sound therapy.   Biofeedback.   Working with a physical therapist to keep from getting stiff.   Regular, gentle exercise.   Cognitive or behavioral therapy.   Group support.  Sometimes, surgery may be recommended.  HOME CARE INSTRUCTIONS   Take all medicines as directed by your health care provider.   Lessen stress in your life by relaxing and doing things such as listening to calming music.   Exercise or be active as directed by your health care provider.   Eat a healthy diet and include things such as vegetables, fruits, fish, and lean meats in your diet.   Keep all follow-up appointments with your health care  provider.   Attend a support group with others suffering from chronic pain. SEEK MEDICAL CARE IF:   Your pain gets worse.   You develop a new pain that was not there before.   You cannot tolerate medicines given to you by your health care provider.   You have new symptoms since your last visit with your health care provider.  SEEK IMMEDIATE MEDICAL CARE IF:   You feel weak.   You have decreased sensation or numbness.   You lose control of bowel or bladder function.   Your pain suddenly gets much worse.   You develop shaking.  You develop chills.  You develop confusion.  You develop chest pain.  You develop shortness of breath.  MAKE SURE YOU:  Understand these instructions.  Will watch your condition.  Will get help right away if you are not doing well or get worse. Document Released: 11/24/2001 Document Revised: 11/04/2012 Document Reviewed: 08/28/2012 [facility] Patient Information 2015 [place], [place]. This information is not intended to replace advice given to you by your health care provider. Make sure you discuss any questions you have with your health care provider.

## 2014-10-01 NOTE — Discharge Instructions (Signed)
Alcohol Intoxication °Alcohol intoxication occurs when the amount of alcohol that a person has consumed impairs his or her ability to mentally and physically function. Alcohol directly impairs the normal chemical activity of the brain. Drinking large amounts of alcohol can lead to changes in mental function and behavior, and it can cause many physical effects that can be harmful.  °Alcohol intoxication can range in severity from mild to very severe. Various factors can affect the level of intoxication that occurs, such as the person's age, gender, weight, frequency of alcohol consumption, and the presence of other medical conditions (such as diabetes, seizures, or heart conditions). Dangerous levels of alcohol intoxication may occur when people drink large amounts of alcohol in a short period (binge drinking). Alcohol can also be especially dangerous when combined with certain prescription medicines or "recreational" drugs. °SIGNS AND SYMPTOMS °Some common signs and symptoms of mild alcohol intoxication include: °· Loss of coordination. °· Changes in mood and behavior. °· Impaired judgment. °· Slurred speech. °As alcohol intoxication progresses to more severe levels, other signs and symptoms will appear. These may include: °· Vomiting. °· Confusion and impaired memory. °· Slowed breathing. °· Seizures. °· Loss of consciousness. °DIAGNOSIS  °Your health care provider will take a medical history and perform a physical exam. You will be asked about the amount and type of alcohol you have consumed. Blood tests will be done to measure the concentration of alcohol in your blood. In many places, your blood alcohol level must be lower than 80 mg/dL (0.08%) to legally drive. However, many dangerous effects of alcohol can occur at much lower levels.  °TREATMENT  °People with alcohol intoxication often do not require treatment. Most of the effects of alcohol intoxication are temporary, and they go away as the alcohol naturally  leaves the body. Your health care provider will monitor your condition until you are stable enough to go home. Fluids are sometimes given through an IV access tube to help prevent dehydration.  °HOME CARE INSTRUCTIONS °· Do not drive after drinking alcohol. °· Stay hydrated. Drink enough water and fluids to keep your urine clear or pale yellow. Avoid caffeine.   °· Only take over-the-counter or prescription medicines as directed by your health care provider.   °SEEK MEDICAL CARE IF:  °· You have persistent vomiting.   °· You do not feel better after a few days. °· You have frequent alcohol intoxication. Your health care provider can help determine if you should see a substance use treatment counselor. °SEEK IMMEDIATE MEDICAL CARE IF:  °· You become shaky or tremble when you try to stop drinking.   °· You shake uncontrollably (seizure).   °· You throw up (vomit) blood. This may be bright red or may look like black coffee grounds.   °· You have blood in your stool. This may be bright red or may appear as a black, tarry, bad smelling stool.   °· You become lightheaded or faint.   °MAKE SURE YOU:  °· Understand these instructions. °· Will watch your condition. °· Will get help right away if you are not doing well or get worse. °Document Released: 12/12/2004 Document Revised: 11/04/2012 Document Reviewed: 08/07/2012 °ExitCare® Patient Information ©2015 [employer]. This information is not intended to replace advice given to you by your health care provider. Make sure you discuss any questions you have with your health care provider. ° °Alcohol Use Disorder °Alcohol use disorder is a mental disorder. It is not a one-time incident of heavy drinking. Alcohol use disorder is the excessive and   uncontrollable use of alcohol over time that leads to problems with functioning in one or more areas of daily living. People with this disorder risk harming themselves and others when they drink to excess. Alcohol use disorder also  can cause other mental disorders, such as mood and anxiety disorders, and serious physical problems. People with alcohol use disorder often misuse other drugs.  Alcohol use disorder is common and widespread. Some people with this disorder drink alcohol to cope with or escape from negative life events. Others drink to relieve chronic pain or symptoms of mental illness. People with a family history of alcohol use disorder are at higher risk of losing control and using alcohol to excess.  SYMPTOMS  Signs and symptoms of alcohol use disorder may include the following:   Consumption ofalcohol inlarger amounts or over a longer period of time than intended.  Multiple unsuccessful attempts to cutdown or control alcohol use.   A great deal of time spent obtaining alcohol, using alcohol, or recovering from the effects of alcohol (hangover).  A strong desire or urge to use alcohol (cravings).   Continued use of alcohol despite problems at work, school, or home because of alcohol use.   Continued use of alcohol despite problems in relationships because of alcohol use.  Continued use of alcohol in situations when it is physically hazardous, such as driving a car.  Continued use of alcohol despite awareness of a physical or psychological problem that is likely related to alcohol use. Physical problems related to alcohol use can involve the brain, heart, liver, stomach, and intestines. Psychological problems related to alcohol use include intoxication, depression, anxiety, psychosis, delirium, and dementia.   The need for increased amounts of alcohol to achieve the same desired effect, or a decreased effect from the consumption of the same amount of alcohol (tolerance).  Withdrawal symptoms upon reducing or stopping alcohol use, or alcohol use to reduce or avoid withdrawal symptoms. Withdrawal symptoms include:  Racing heart.  Hand tremor.  Difficulty  sleeping.  Nausea.  Vomiting.  Hallucinations.  Restlessness.  Seizures. DIAGNOSIS Alcohol use disorder is diagnosed through an assessment by your health care provider. Your health care provider may start by asking three or four questions to screen for excessive or problematic alcohol use. To confirm a diagnosis of alcohol use disorder, at least two symptoms must be present within a 65-month period. The severity of alcohol use disorder depends on the number of symptoms:  Mild--two or three.  Moderate--four or five.  Severe--six or more. Your health care provider may perform a physical exam or use results from lab tests to see if you have physical problems resulting from alcohol use. Your health care provider may refer you to a mental health professional for evaluation. TREATMENT  Some people with alcohol use disorder are able to reduce their alcohol use to low-risk levels. Some people with alcohol use disorder need to quit drinking alcohol. When necessary, mental health professionals with specialized training in substance use treatment can help. Your health care provider can help you decide how severe your alcohol use disorder is and what type of treatment you need. The following forms of treatment are available:   Detoxification. Detoxification involves the use of prescription medicines to prevent alcohol withdrawal symptoms in the first week after quitting. This is important for people with a history of symptoms of withdrawal and for heavy drinkers who are likely to have withdrawal symptoms. Alcohol withdrawal can be dangerous and, in severe cases, cause death. Detoxification  is usually provided in a hospital or in-patient substance use treatment facility.  Counseling or talk therapy. Talk therapy is provided by substance use treatment counselors. It addresses the reasons people use alcohol and ways to keep them from drinking again. The goals of talk therapy are to help people with alcohol  use disorder find healthy activities and ways to cope with life stress, to identify and avoid triggers for alcohol use, and to handle cravings, which can cause relapse.  Medicines.Different medicines can help treat alcohol use disorder through the following actions:  Decrease alcohol cravings.  Decrease the positive reward response felt from alcohol use.  Produce an uncomfortable physical reaction when alcohol is used (aversion therapy).  Support groups. Support groups are run by people who have quit drinking. They provide emotional support, advice, and guidance. These forms of treatment are often combined. Some people with alcohol use disorder benefit from intensive combination treatment provided by specialized substance use treatment centers. Both inpatient and outpatient treatment programs are available. Document Released: 04/11/2004 Document Revised: 07/19/2013 Document Reviewed: 06/11/2012 [facility] Patient Information 2015 [place], [place]. This information is not intended to replace advice given to you by your health care provider. Make sure you discuss any questions you have with your health care provider.  Crohn Disease Crohn disease is a long-term (chronic) soreness and redness (inflammation) of the intestines (bowel). It can affect any portion of the digestive tract, from the mouth to the anus. It can also cause problems outside the digestive tract. Crohn disease is closely related to a disease called ulcerative colitis (together, these two diseases are called inflammatory bowel disease).  CAUSES  The cause of Crohn disease is not known. One Link Snuffer is that, in an easily affected person, the immune system is triggered to attack the body's own digestive tissue. Crohn disease runs in families. It seems to be more common in certain geographic areas and amongst certain races. There are no clear-cut dietary causes.  SYMPTOMS  Crohn disease can cause many different symptoms since it can affect  many different parts of the body. Symptoms include:  Fatigue.  Weight loss.  Chronic diarrhea, sometime bloody.  Abdominal pain and cramps.  Fever.  Ulcers or canker sores in the mouth or rectum.  Anemia (low red blood cells).  Arthritis, skin problems, and eye problems may occur. Complications of Crohn disease can include:  Series of holes (perforation) of the bowel.  Portions of the intestines sticking to each other (adhesions).  Obstruction of the bowel.  Fistula formation, typically in the rectal area but also in other areas. A fistula is an opening between the bowels and the outside, or between the bowels and another organ.  A painful crack in the mucous membrane of the anus (rectal fissure). DIAGNOSIS  Your caregiver may suspect Crohn disease based on your symptoms and an exam. Blood tests may confirm that there is a problem. You may be asked to submit a stool specimen for examination. X-rays and CT scans may be necessary. Ultimately, the diagnosis is usually made after a procedure that uses a flexible tube that is inserted via your mouth or your anus. This is done under sedation and is called either an upper endoscopy or colonoscopy. With these tests, the specialist can take tiny tissue samples and remove them from the inside of the bowel (biopsy). Examination of this biopsy tissue under a microscope can reveal Crohn disease as the cause of your symptoms. Due to the many different forms that Crohn disease can  take, symptoms may be present for several years before a diagnosis is made. TREATMENT  Medications are often used to decrease inflammation and control the immune system. These include medicines related to aspirin, steroid medications, and newer and stronger medications to slow down the immune system. Some medications may be used as suppositories or enemas. A number of other medications are used or have been studied. Your caregiver will make specific recommendations. HOME  CARE INSTRUCTIONS   Symptoms such as diarrhea can be controlled with medications. Avoid foods that have a laxative effect such as fresh fruit, vegetables, and dairy products. During flare-ups, you can rest your bowel by refraining from solid foods. Drink clear liquids frequently during the day. (Electrolyte or rehydrating fluids are best. Your caregiver can help you with suggestions.) Drink often to prevent loss of body fluids (dehydration). When diarrhea has cleared, eat small meals and more frequently. Avoid food additives and stimulants such as caffeine (coffee, tea, or chocolate). Enzyme supplements may help if you develop intolerance to a sugar in dairy products (lactose). Ask your caregiver or dietitian about specific dietary instructions.  Try to maintain a positive attitude. Learn relaxation techniques such as self-hypnosis, mental imaging, and muscle relaxation.  If possible, avoid stresses which can aggravate your condition.  Exercise regularly.  Follow your diet.  Always get plenty of rest. SEEK MEDICAL CARE IF:   Your symptoms fail to improve after a week or two of new treatment.  You experience continued weight loss.  You have ongoing cramps or loose bowels.  You develop a new skin rash, skin sores, or eye problems. SEEK IMMEDIATE MEDICAL CARE IF:   You have worsening of your symptoms or develop new symptoms.  You have a fever.  You develop bloody diarrhea.  You develop severe abdominal pain. MAKE SURE YOU:   Understand these instructions.  Will watch your condition.  Will get help right away if you are not doing well or get worse. Document Released: 12/12/2004 Document Revised: 07/19/2013 Document Reviewed: 11/10/2006 [facility] Patient Information 2015 [place], [place]. This information is not intended to replace advice given to you by your health care provider. Make sure you discuss any questions you have with your health care provider.    Emergency Department  Resource Guide 1) Find a Doctor and Pay Out of Pocket Although you won't have to find out who is covered by your insurance plan, it is a good idea to ask around and get recommendations. You will then need to call the office and see if the doctor you have chosen will accept you as a new patient and what types of options they offer for patients who are self-pay. Some doctors offer discounts or will set up payment plans for their patients who do not have insurance, but you will need to ask so you aren't surprised when you get to your appointment.  2) Contact Your Local Health Department Not all health departments have doctors that can see patients for sick visits, but many do, so it is worth a call to see if yours does. If you don't know where your local health department is, you can check in your phone book. The CDC also has a tool to help you locate your state's health department, and many state websites also have listings of all of their local health departments.  3) Find a [facility] If your illness is not likely to be very severe or complicated, you may want to try a walk in clinic. These are popping  up all over the country in pharmacies, drugstores, and shopping centers. They're usually staffed by nurse practitioners or physician assistants that have been trained to treat common illnesses and complaints. They're usually fairly quick and inexpensive. However, if you have serious medical issues or chronic medical problems, these are probably not your best option.  No Primary Care Doctor: - Call Health Connect at  [phone] - they can help you locate a primary care doctor that  accepts your insurance, provides certain services, etc. - Physician Referral Service- [phone]  Chronic Pain Problems: Organization         Address  Phone   Notes  [facility]  [phone] Patients need to be referred by their primary care doctor.   Medication Assistance: Organization          Address  Phone   Notes  [facility] Medication [facility] [address] [place], [place] [address] [phone] --Must be a resident of [facility] -- Must have NO insurance coverage whatsoever (no Medicaid/ Medicare, etc.) -- The pt. MUST have a primary care doctor that directs their care regularly and follows them in the community   [facility]ist  [phone]   [employer]  [phone]    Agencies that provide inexpensive medical care: Organization         Address  Phone   Notes  [place]  [phone]   [facility]    [phone]   [facility] [place], [place] [address] [phone]   [place] [address] [place] [phone]   Planned Parenthood    [phone]   [facility]    [phone]   [place] and [place] [address], [place] Phone:  [phone], Fax:  [phone] Hours of Operation:  9 am - 6 pm, M-F.  Also accepts Medicaid/Medicare and self-pay.  [facility] for [place] [address], [address], [place] Phone: [phone], Fax: [phone]. Hours of Operation:  8:30 am - 5:30 pm, M-F.  Also accepts Medicaid and self-pay.  [facility] [place] [address] [place] Phone: [phone]   [place], [place], [place] [phone], Ext. [phone] Mondays & Thursdays: 7-9 AM.  First 15 patients are seen on a first come, first serve basis.    [place] Providers:  Organization         Address  Phone   Notes  [facility] [address], [place] [phone] Also accepts self-pay patients.  [facility] [address], [place]  [phone]   [place], [address], [place] [phone]   [facility] [address], [place] [phone]   Lucianne Lei [address], [place]   [phone] Only accepts [place] Access [place] patients after they have their name applied to their card.   Self-Pay (no insurance) in [facility]:  Organization         Address  Phone   Notes  Sickle Cell Patients, [facility] [place] [phone]   [facility] [place] [phone]   [facility]gent [place]  [phone]  [address], [place] [phone]   [facility]/Dr. Osei-Bonsu  [address] [place] or [address], [place] [phone] Phone number for both [place] and [place] locations is the same.  Urgent Medical and [facility] [address], [place] [phone]   [facility] [address], [place] or [address] [phone]   [facility] [address] [place] [phone], phone; [phone], fax Sees patients 1st and 3rd Saturday of every month.  Must not qualify for public or private insurance (i.e. Medicaid, Medicare, [place] Health Choice, Veterans' Benefits)  Household income should be no more than 200% of the poverty level The clinic cannot treat you if you are pregnant or think you are pregnant  Sexually transmitted diseases are not treated at the clinic.    Dental Care: Organization         Address  Phone  Notes  [facility] Department of [facility] [place] [phone] Accepts children up to age 64 who are enrolled in [place] or [place]; pregnant women with a Medicaid card; and children who have applied for Medicaid or [place] Health Choice, but were declined, whose parents can pay a reduced fee at time of service.  [facility] Department of [facility]  [address], [place] [phone] Accepts children up to age 105 who are enrolled in [place] or [place]; pregnant women with a Medicaid card; and children who have applied for Medicaid or [place] Health Choice, but were declined, whose parents can pay a reduced fee at time of service.  [facility]  [place] [phone] Patients are seen by appointment only. Walk-ins are not accepted. [place] will see patients 37 years of age and older. Monday - Tuesday (8am-5pm) Most Wednesdays (8:30-5pm) $30 per visit, cash only  [facility] Adult Dental Access PROGRAM  [address], [facility] [phone] Patients are seen by appointment only. Walk-ins are not accepted. [place] will see patients 73 years of age and older. One Wednesday Evening (Monthly: Volunteer Based).  $30 per visit, cash only  [place]  [phone] for adults; Children under age 46, call Graduate Pediatric Dentistry at [phone]. Children aged 59-14, please call [phone] to request a pediatric application.  Dental services are provided in all areas of dental care including fillings, crowns and bridges, complete and partial dentures, implants, gum treatment, root canals, and extractions. Preventive care is also provided. Treatment is provided to both adults and children. Patients are selected via a lottery and there is often a waiting list.   [facility] [address], [place]  [phone] [email or web address]   [facility] [address] [place], [place] [phone], Ext. [phone] Second and Fourth Thursday of each month, opens at 6:30 AM; Clinic ends at 9 AM.  Patients are seen on a first-come first-served basis, and a limited number are seen during each clinic.   [facility]  [address] Hillard Danker [place], [place] [phone]   Eligibility Requirements You must  have lived in [place], [place], or [place] counties for at least the last three months.   You cannot be eligible for state or federal sponsored [employer], including [employer], [place], or  Medicare.   You generally cannot be eligible for healthcare insurance through your employer.    How to apply: Eligibility screenings are held every Tuesday and Wednesday afternoon from 1:00 pm until 4:00 pm. You do not need an appointment for the interview!  [facility] [address], [place], [place]   [place]  [place] Department  [place]  [phone]    Behavioral Health Resources in the Community: Intensive Outpatient Programs Organization         Address  Phone  Notes  [place]. [address], [place], [place] [phone]   [facility]patient [address] [place], [place]   ADS: Alcohol [facility] Drug [facility] [address], [place], [place]   [place] [address]  [place], [place] or [phone]   Substance Abuse Resources Organization         Address  Phone  Notes  Alcohol and Drug Services  [phone]   [place]  [phone]   [facility] [place]   Chinita Pester  [phone]   Residential & Outpatient Substance Abuse Program  [phone]   Psychological Services Organization         Address  Phone  Notes  [facility] [place]  [place]  [phone]   [place] [address] [place] or [phone]    Mobile Crisis Teams Organization         Address  Phone  Notes  [facility], Mobile Crisis Care Unit  [phone]   [facility]  [address] [place], [place]   [place] [address], [place] [phone]    Self-Help/Support Groups Organization         Address  Phone             Notes  [place]. of [place] - variety of support groups  [place] Call for more information  Narcotics Anonymous (NA), [facility] [address], [employer] [place]  2 meetings at this location   Special educational needs teacher         Address  Phone  Notes  [facility] [place],    [place]  [phone]   [facility]  [address], [place] [address], [place], [place]   [place], [place] [phone] Admissions: 8am-3pm M-F  Incentives Substance [place] [address],    [place], [place] [phone]   [facility] [address] [place], [place], [place]   [facility] [address],  [place], [place]   [facility] Programs - Intensive Outpatient [address], [place], [place]   [facility] ([address] [place]  [place], [place] [phone] or [phone]   Residential Treatment Services (RTS) [address], [place], [place] Accepts Medicaid  [facility] [address],  [place] [phone] Substance Abuse/Addiction Treatment   [facility]         Address  Phone  Notes  [facility]  [phone]   Domenic Schwab, PhD [address] [place], [place]   [phone] or [phone]   [place], [place] [phone]   [facility] [address], [place], [place] [phone] Insurance/Medicaid/sponsorship through [facility] and Families [address] [place], [place] [phone] [place], [place] [phone]    Dr. Adele Schilder  [phone]   [facility] Dept[facility] 1) [address], [place] 2) [place] 3)  [address], [place] [phone]  [phone]   [place] [phone] or [phone] (After Hours)

## 2014-10-01 NOTE — ED Notes (Addendum)
He reports he had a seizure this morning, he has a history of seizures that he does take daily medication for. He has a history of crohns and has had nausea, abd pain , and diarrhea x 1 day. He is A&Ox4, resp e/u

## 2014-10-01 NOTE — ED Notes (Signed)
Pt undressed, in gown, on monitor, continuous pulse oximetry and blood pressure cuff; warm blanket given

## 2014-10-01 NOTE — ED Notes (Signed)
Pt.'s niece notified that pt. Is discharged home .

## 2014-10-01 NOTE — ED Notes (Signed)
Traci, RN notified of pt's request for pain medicine

## 2014-10-01 NOTE — ED Notes (Signed)
Pt resting, asking for pain medicine for his stomach; will inform Traci, RN

## 2014-10-01 NOTE — ED Provider Notes (Signed)
CSN: [id number]     Arrival date & time 10/01/14  1232 History   First MD Initiated Contact with Patient 10/01/14 1259     Chief Complaint  Patient presents with  . Seizures  . Abdominal Pain     (Consider location/radiation/quality/duration/timing/severity/associated sxs/prior Treatment) HPI Comments: Patient with a history of Crohn's disease, h/o partial colectomy and colostomy reversal, anemia requiring transfusion, alcohol abuse/dependence presents less than 8 hours after discharge from emergency room evaluation with complaint of "I had a seizure". The patient reports he went home and laid down on his cough. He got up to the kitchen for some water and on return to the couch felt that he passed out and had a seizure. No one else home to witness event. He estimates it lasted about one minute. He remained on the cough and did not have any fall to the floor. No vomiting, tongue biting or urinary incontinence. The patient reports a history of seizure, not on any medications. He also complains of abdominal pain but reports it is his "usual abdominal pain" he has chronically with h/o Crohn's.   Patient is a 34 y.o. male presenting with seizures and abdominal pain. The history is provided by the patient. No language interpreter was used.  Seizures Seizure activity on arrival: no   Abdominal Pain Associated symptoms: nausea   Associated symptoms: no chills and no fever     Past Medical History  Diagnosis Date  . Crohn's disease dx'd 74  . Rectal ulcer   . Iron deficiency anemia   . Alcohol abuse   . Tobacco abuse   . Severe malnutrition   . Seizure   . Hepatic steatosis    Past Surgical History  Procedure Laterality Date  . Hernia repair      "stomach"  . Colostomy    . Partial colectomy    . Colostomy reversal    . Flexible sigmoidoscopy N/A 11/27/2013    Procedure: FLEXIBLE SIGMOIDOSCOPY;  Surgeon: Beryle Beams, MD;  Location: [place];  Service: Endoscopy;  Laterality:  N/A;   Family History  Problem Relation Age of Onset  . Breast cancer Mother 58   History  Substance Use Topics  . Smoking status: Current Every Day Smoker -- 0.25 packs/day for 23 years    Types: Cigarettes  . Smokeless tobacco: Never Used     Comment: smoking 8 cigarettes/day   . Alcohol Use: 1.2 oz/week    2 Shots of liquor per week    Review of Systems  Constitutional: Negative for fever and chills.  HENT: Negative.  Negative for mouth sores.   Respiratory: Negative.   Cardiovascular: Negative.   Gastrointestinal: Positive for nausea and abdominal pain.  Musculoskeletal: Negative.  Negative for myalgias, back pain and neck pain.  Skin: Negative.  Negative for wound.  Neurological: Positive for seizures. Negative for headaches.      Allergies  Aspirin; Ibuprofen; and Tramadol  Home Medications   Prior to Admission medications   Medication Sig Start Date End Date Taking? Authorizing Provider  oxyCODONE (OXY IR/ROXICODONE) 5 MG immediate release tablet Take 1 tablet (5 mg total) by mouth every 6 (six) hours as needed for moderate pain or severe pain. After 3 days decrease to 1 tablet as needed up to every 4 hours for pain. Patient not taking: Reported on 09/30/2014 09/10/14   Blanchie Dessert, MD  promethazine (PHENERGAN) 25 MG tablet Take 1 tablet (25 mg total) by mouth every 6 (six) hours as needed for  nausea or vomiting. Patient not taking: Reported on 09/30/2014 09/10/14   Blanchie Dessert, MD   BP 120/58 mmHg  Pulse 106  Temp(Src) 98.6 F (37 C) (Oral)  Resp 18  Ht 5\' 6"  (1.676 m)  Wt 125 lb 4.8 oz (56.836 kg)  BMI 20.23 kg/m2  SpO2 94% Physical Exam  Constitutional: He is oriented to person, place, and time. He appears well-developed and well-nourished.  HENT:  Head: Normocephalic and atraumatic.  Neck: Normal range of motion. Neck supple.  Cardiovascular: Normal rate and regular rhythm.   Pulmonary/Chest: Effort normal and breath sounds normal. He has no  wheezes. He has no rales.  Abdominal: Soft. Bowel sounds are normal. There is tenderness. There is no rebound and no guarding.  Diffusely tender abdomen, soft, BS throughout.  Musculoskeletal: Normal range of motion. He exhibits no edema.  Neurological: He is alert and oriented to person, place, and time.  Awake, alert, coordinated. No seizure activity in emergency department. Follow commands. CN's 3-12 grossly intact.   Skin: Skin is warm and dry. No rash noted.  Psychiatric: He has a normal mood and affect.    ED Course  Procedures (including critical care time) Labs Review Labs Reviewed  COMPREHENSIVE METABOLIC PANEL - Abnormal; Notable for the following:    CO2 20 (*)    BUN <5 (*)    Calcium 8.3 (*)    Albumin 3.4 (*)    AST 139 (*)    All other components within normal limits  CBC - Abnormal; Notable for the following:    WBC 13.9 (*)    RBC 4.04 (*)    Hemoglobin 8.9 (*)    HCT 30.1 (*)    MCV 74.5 (*)    MCH 22.0 (*)    MCHC 29.6 (*)    RDW 17.0 (*)    Platelets 624 (*)    All other components within normal limits  URINALYSIS, ROUTINE W REFLEX MICROSCOPIC (NOT AT [facility]) - Abnormal; Notable for the following:    Specific Gravity, Urine 1.003 (*)    All other components within normal limits  ETHANOL - Abnormal; Notable for the following:    Alcohol, Ethyl (B) 226 (*)    All other components within normal limits  LIPASE, BLOOD  DIFFERENTIAL   Results for orders placed or performed during the hospital encounter of 10/01/14  Lipase, blood  Result Value Ref Range   Lipase 25 22 - 51 U/L  Comprehensive metabolic panel  Result Value Ref Range   Sodium 139 135 - 145 mmol/L   Potassium 4.4 3.5 - 5.1 mmol/L   Chloride 107 101 - 111 mmol/L   CO2 20 (L) 22 - 32 mmol/L   Glucose, Bld 76 65 - 99 mg/dL   BUN <5 (L) 6 - 20 mg/dL   Creatinine, Ser 0.80 0.61 - 1.24 mg/dL   Calcium 8.3 (L) 8.9 - 10.3 mg/dL   Total Protein 6.9 6.5 - 8.1 g/dL   Albumin 3.4 (L) 3.5 - 5.0 g/dL    AST 139 (H) 15 - 41 U/L   ALT 57 17 - 63 U/L   Alkaline Phosphatase 108 38 - 126 U/L   Total Bilirubin 0.5 0.3 - 1.2 mg/dL   GFR calc non Af Amer >60 >60 mL/min   GFR calc Af Amer >60 >60 mL/min   Anion gap 12 5 - 15  CBC  Result Value Ref Range   WBC 13.9 (H) 4.0 - 10.5 K/uL   RBC 4.04 (L)  4.22 - 5.81 MIL/uL   Hemoglobin 8.9 (L) 13.0 - 17.0 g/dL   HCT 30.1 (L) 39.0 - 52.0 %   MCV 74.5 (L) 78.0 - 100.0 fL   MCH 22.0 (L) 26.0 - 34.0 pg   MCHC 29.6 (L) 30.0 - 36.0 g/dL   RDW 17.0 (H) 11.5 - 15.5 %   Platelets 624 (H) 150 - 400 K/uL  Urinalysis, Routine w reflex microscopic (not at [facility])  Result Value Ref Range   Color, Urine YELLOW YELLOW   APPearance CLEAR CLEAR   Specific Gravity, Urine 1.003 (L) 1.005 - 1.030   pH 6.0 5.0 - 8.0   Glucose, UA NEGATIVE NEGATIVE mg/dL   Hgb urine dipstick NEGATIVE NEGATIVE   Bilirubin Urine NEGATIVE NEGATIVE   Ketones, ur NEGATIVE NEGATIVE mg/dL   Protein, ur NEGATIVE NEGATIVE mg/dL   Urobilinogen, UA 0.2 0.0 - 1.0 mg/dL   Nitrite NEGATIVE NEGATIVE   Leukocytes, UA NEGATIVE NEGATIVE  Ethanol  Result Value Ref Range   Alcohol, Ethyl (B) 226 (H) <5 mg/dL    Imaging Review No results found.   EKG Interpretation None      MDM   Final diagnoses:  None    1. Alcohol intoxication 2. Chronic abdominal pain  The patient is awake, and alert on admission to ED - no post-ictal symptoms. Chart reviewed. He was admitted in February of this year with no in-hospital seizure activity. Discharged without seizure medications. Consider alcohol withdrawal and possibility of seizure today, however, BAL still high at 226. Not likely to produce seizure from withdrawal.   Multiple re-evaluations of the patient find him sleeping without distress or complaint. VSS, tachycardia present on admission today normalized to 68 on final check. The patient will continue to seek alcohol treatment as discussed last night on previous ED evaluation. Outpatient  resources provided. Will provide Rx for librium to avoid complications of withdrawal.   On discharge, the patient complains of abdominal pain. Abdominal exam is essentially non-concerning - soft, +BS, no mass, diffuse tenderness that is mild. He can follow up with his primary care provider for further pain management. No vomiting in ED after 3 1/2 hours observation. Patient sleeping most of the time without complaint. Feel he can be discharged home safely.  Charlann Lange, PA-C 10/01/14 [place], MD 10/02/14 [phone]

## 2014-10-21 ENCOUNTER — Encounter (HOSPITAL_COMMUNITY): Payer: Self-pay | Admitting: Emergency Medicine

## 2014-10-21 ENCOUNTER — Emergency Department (HOSPITAL_COMMUNITY)
Admission: EM | Admit: 2014-10-21 | Discharge: 2014-10-22 | Disposition: A | Discharge status: Home / Self Care | Payer: Self-pay | Attending: Emergency Medicine | Admitting: Emergency Medicine

## 2014-10-21 DIAGNOSIS — K5090 Crohn's disease, unspecified, without complications: Secondary | ICD-10-CM | POA: Insufficient documentation

## 2014-10-21 DIAGNOSIS — R000 Tachycardia, unspecified: Secondary | ICD-10-CM | POA: Insufficient documentation

## 2014-10-21 DIAGNOSIS — G8929 Other chronic pain: Secondary | ICD-10-CM | POA: Insufficient documentation

## 2014-10-21 DIAGNOSIS — Z79899 Other long term (current) drug therapy: Secondary | ICD-10-CM | POA: Insufficient documentation

## 2014-10-21 DIAGNOSIS — Z720 Tobacco use: Secondary | ICD-10-CM | POA: Insufficient documentation

## 2014-10-21 DIAGNOSIS — R109 Unspecified abdominal pain: Secondary | ICD-10-CM

## 2014-10-21 DIAGNOSIS — R1084 Generalized abdominal pain: Secondary | ICD-10-CM | POA: Insufficient documentation

## 2014-10-21 LAB — URINALYSIS, ROUTINE W REFLEX MICROSCOPIC
Bilirubin Urine: NEGATIVE
Glucose, UA: NEGATIVE mg/dL
Hgb urine dipstick: NEGATIVE
KETONES UR: 15 mg/dL — AB
Leukocytes, UA: NEGATIVE
NITRITE: NEGATIVE
Protein, ur: NEGATIVE mg/dL
Specific Gravity, Urine: 1.015 (ref 1.005–1.030)
Urobilinogen, UA: 0.2 mg/dL (ref 0.0–1.0)
pH: 6.5 (ref 5.0–8.0)

## 2014-10-21 LAB — COMPREHENSIVE METABOLIC PANEL
ALK PHOS: 86 U/L (ref 38–126)
ALT: 42 U/L (ref 17–63)
AST: 75 U/L — AB (ref 15–41)
Albumin: 3.9 g/dL (ref 3.5–5.0)
Anion gap: 14 (ref 5–15)
BUN: 8 mg/dL (ref 6–20)
CALCIUM: 9.7 mg/dL (ref 8.9–10.3)
CHLORIDE: 98 mmol/L — AB (ref 101–111)
CO2: 23 mmol/L (ref 22–32)
CREATININE: 0.85 mg/dL (ref 0.61–1.24)
GFR calc Af Amer: >60 mL/min (ref >60)
GFR calc non Af Amer: >60 mL/min (ref >60)
GLUCOSE: 116 mg/dL — AB (ref 65–99)
Potassium: 4.2 mmol/L (ref 3.5–5.1)
Sodium: 135 mmol/L (ref 135–145)
Total Bilirubin: 0.6 mg/dL (ref 0.3–1.2)
Total Protein: 7.5 g/dL (ref 6.5–8.1)

## 2014-10-21 LAB — CBC WITH DIFFERENTIAL/PLATELET
Basophils Absolute: 0.1 10*3/uL (ref 0.0–0.1)
Basophils Relative: 1 % (ref 0–1)
Eosinophils Absolute: 0 10*3/uL (ref 0.0–0.7)
Eosinophils Relative: 0 % (ref 0–5)
HEMATOCRIT: 30.3 % — AB (ref 39.0–52.0)
Hemoglobin: 8.9 g/dL — ABNORMAL LOW (ref 13.0–17.0)
Lymphocytes Relative: 12 % (ref 12–46)
Lymphs Abs: 1.7 10*3/uL (ref 0.7–4.0)
MCH: 21.1 pg — ABNORMAL LOW (ref 26.0–34.0)
MCHC: 29.4 g/dL — ABNORMAL LOW (ref 30.0–36.0)
MCV: 72 fL — ABNORMAL LOW (ref 78.0–100.0)
Monocytes Absolute: 1.4 10*3/uL — ABNORMAL HIGH (ref 0.1–1.0)
Monocytes Relative: 10 % (ref 3–12)
NEUTROS ABS: 10.6 10*3/uL — AB (ref 1.7–7.7)
Neutrophils Relative %: 77 % (ref 43–77)
Platelets: 442 10*3/uL — ABNORMAL HIGH (ref 150–400)
RBC: 4.21 MIL/uL — ABNORMAL LOW (ref 4.22–5.81)
RDW: 17.6 % — ABNORMAL HIGH (ref 11.5–15.5)
WBC: 13.8 10*3/uL — ABNORMAL HIGH (ref 4.0–10.5)

## 2014-10-21 LAB — C-REACTIVE PROTEIN: CRP: <0.5 mg/dL (ref <1.0)

## 2014-10-21 LAB — LIPASE, BLOOD: Lipase: 26 U/L (ref 22–51)

## 2014-10-21 MED ORDER — SODIUM CHLORIDE 0.9 % IV BOLUS (SEPSIS)
1000.0000 mL | Freq: Once | INTRAVENOUS | Status: AC
  Administered 2014-10-21: 1000 mL via INTRAVENOUS

## 2014-10-21 MED ORDER — LORAZEPAM 2 MG/ML IJ SOLN
1.0000 mg | Freq: Once | INTRAMUSCULAR | Status: AC
  Administered 2014-10-21: 1 mg via INTRAVENOUS
  Filled 2014-10-21: qty 1

## 2014-10-21 MED ORDER — OXYCODONE-ACETAMINOPHEN 5-325 MG PO TABS
1.0000 | ORAL_TABLET | Freq: Once | ORAL | Status: AC
  Administered 2014-10-21: 1 via ORAL
  Filled 2014-10-21: qty 1

## 2014-10-21 MED ORDER — ONDANSETRON HCL 4 MG/2ML IJ SOLN
4.0000 mg | Freq: Once | INTRAMUSCULAR | Status: AC
  Administered 2014-10-21: 4 mg via INTRAVENOUS

## 2014-10-21 NOTE — ED Notes (Signed)
Attempted IV x4 between two RNs, unsuccessful

## 2014-10-21 NOTE — ED Notes (Signed)
MD at bedside. 

## 2014-10-21 NOTE — ED Notes (Signed)
Pt sts abd pain with N/V/D from crohns; pt sts hx of same in past

## 2014-10-21 NOTE — ED Provider Notes (Signed)
CSN: [id number]     Arrival date & time 10/21/14  1731 History   First MD Initiated Contact with Patient 10/21/14 2113     Chief Complaint  Patient presents with  . Abdominal Pain     (Consider location/radiation/quality/duration/timing/severity/associated sxs/prior Treatment) HPI Comments: 34 year old male with past medical history including Crohn's disease status post bowel resection, alcohol abuse, seizures who presents with abdominal pain and vomiting. The patient states that yesterday morning he began having nausea followed by vomiting and generalized abdominal pain. He feels like this is consistent with previous Crohn's flares. He feels generally weak. He denies any diarrhea or blood in his stool. No urinary symptoms. MAXIMUM TEMPERATURE 99.6 at home. His last drink was yesterday. He does endorse a history of alcohol withdrawal seizures. He has been taking his Crohn's and seizure medications as prescribed.  Patient is a 34 y.o. male presenting with abdominal pain. The history is provided by the patient.  Abdominal Pain   Past Medical History  Diagnosis Date  . Crohn's disease dx'd 4  . Rectal ulcer   . Iron deficiency anemia   . Alcohol abuse   . Tobacco abuse   . Severe malnutrition   . Seizure   . Hepatic steatosis    Past Surgical History  Procedure Laterality Date  . Hernia repair      "stomach"  . Colostomy    . Partial colectomy    . Colostomy reversal    . Flexible sigmoidoscopy N/A 11/27/2013    Procedure: FLEXIBLE SIGMOIDOSCOPY;  Surgeon: Beryle Beams, MD;  Location: [place];  Service: Endoscopy;  Laterality: N/A;   Family History  Problem Relation Age of Onset  . Breast cancer Mother 14   History  Substance Use Topics  . Smoking status: Current Every Day Smoker -- 0.25 packs/day for 23 years    Types: Cigarettes  . Smokeless tobacco: Never Used     Comment: smoking 8 cigarettes/day   . Alcohol Use: 1.2 oz/week    2 Shots of liquor per week     Review of Systems  Gastrointestinal: Positive for abdominal pain.    10 Systems reviewed and are negative for acute change except as noted in the HPI.   Allergies  Aspirin; Ibuprofen; and Tramadol  Home Medications   Prior to Admission medications   Medication Sig Start Date End Date Taking? Authorizing Provider  azaTHIOprine (IMURAN) 50 MG tablet Take 50 mg by mouth daily.   Yes Historical Provider, MD  divalproex (DEPAKOTE) 250 MG DR tablet Take 250 mg by mouth 2 (two) times daily.   Yes Historical Provider, MD  promethazine (PHENERGAN) 25 MG tablet Take 1 tablet (25 mg total) by mouth every 6 (six) hours as needed for nausea or vomiting. 10/22/14   Wenda Overland Shakeel Disney, MD   BP 134/83 mmHg  Pulse 113  Temp(Src) 98.2 F (36.8 C) (Oral)  Resp 14  SpO2 98% Physical Exam  Constitutional: He is oriented to person, place, and time.  Thin, chronically ill appearing, tremulous and in moderate distress due to pain  HENT:  Head: Normocephalic and atraumatic.  Moist mucous membranes  Eyes: Conjunctivae are normal. Pupils are equal, round, and reactive to light.  Neck: Neck supple.  Cardiovascular: Regular rhythm and normal heart sounds.   No murmur heard. tachycardic  Pulmonary/Chest: Effort normal and breath sounds normal.  Abdominal: Soft. Bowel sounds are normal. He exhibits no distension. There is tenderness.  Generalized tenderness w/o rebound, some voluntary guarding  Musculoskeletal:  He exhibits no edema.  Neurological: He is alert and oriented to person, place, and time.  Fluent speech  Skin: Skin is warm and dry.  Psychiatric:  Distressed, tearful  Nursing note and vitals reviewed.   ED Course  Procedures (including critical care time) Labs Review Labs Reviewed  COMPREHENSIVE METABOLIC PANEL - Abnormal; Notable for the following:    Chloride 98 (*)    Glucose, Bld 116 (*)    AST 75 (*)    All other components within normal limits  URINALYSIS, ROUTINE W  REFLEX MICROSCOPIC (NOT AT [facility]) - Abnormal; Notable for the following:    Ketones, ur 15 (*)    All other components within normal limits  CBC WITH DIFFERENTIAL/PLATELET - Abnormal; Notable for the following:    WBC 13.8 (*)    RBC 4.21 (*)    Hemoglobin 8.9 (*)    HCT 30.3 (*)    MCV 72.0 (*)    MCH 21.1 (*)    MCHC 29.4 (*)    RDW 17.6 (*)    Platelets 442 (*)    Neutro Abs 10.6 (*)    Monocytes Absolute 1.4 (*)    All other components within normal limits  LIPASE, BLOOD  SEDIMENTATION RATE  C-REACTIVE PROTEIN    Imaging Review No results found.   EKG Interpretation None      MDM   Final diagnoses:  Chronic abdominal pain   34 year old male with history of Crohn's disease who presents with 1 day of vomiting and abdominal pain which she states feels like a Crohn's flare. Vital signs notable for tachycardia but patient afebrile. Patient tremulous and tearful on exam. Abdominal exam challenging due to voluntary guarding. No peritonitis or abdominal distention. Obtained above lab work and gave the patient IV fluid bolus, Zofran, and Ativan due to his tachycardia and no EtOH today.  Labs reassuring with normal inflammatory markers. Given these findings, I feel that is very unlikely that the patient's pain is due to Crohn's exacerbation thus I did not give him any steroids. I feel that his symptoms are more likely to be an acute exacerbation of his chronic abdominal pain. On reexamination, the patient was more comfortable and requested a sandwich and drink, which he tolerated. I instructed him on importance of follow-up at the wellness clinic. Provided with a short course of Phenergan for use at home. Patient discharged in satisfactory condition.  Sharlett Iles, MD 10/22/14 [phone]

## 2014-10-22 LAB — SEDIMENTATION RATE: Sed Rate: 15 mm/hr (ref 0–16)

## 2014-10-22 MED ORDER — OXYCODONE-ACETAMINOPHEN 5-325 MG PO TABS
1.0000 | ORAL_TABLET | Freq: Once | ORAL | Status: AC
  Administered 2014-10-22: 1 via ORAL
  Filled 2014-10-22: qty 1

## 2014-10-22 MED ORDER — PROMETHAZINE HCL 25 MG PO TABS
25.0000 mg | ORAL_TABLET | Freq: Four times a day (QID) | ORAL | Status: AC | PRN
Start: 2014-10-22 — End: ?

## 2014-10-22 NOTE — Discharge Instructions (Signed)

## 2016-01-24 IMAGING — CT CT HEAD W/O CM
1 of 2 series · 16 of 30 positions shown, 20 images · non-contrast
Comparison: None.

CLINICAL DATA: Seizures

EXAM:
CT HEAD WITHOUT CONTRAST
TECHNIQUE: Contiguous axial images were obtained from the base of the skull
through the vertex without intravenous contrast.

[Series 2: head 4.8 h37s · axial · 0.45mm/px · z∈[-155,+1]mm · 16 of 36 slices shown, 20 images]
[im 2/36  brain]
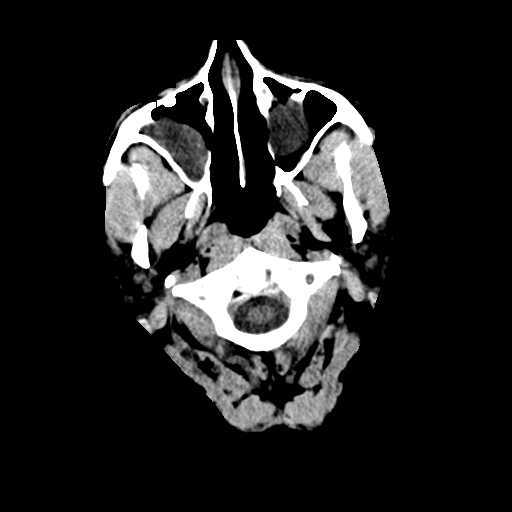
[im 2/36  bone]
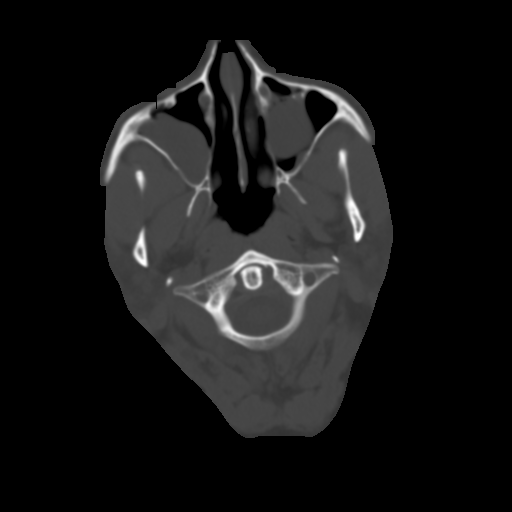
[im 4/36  brain]
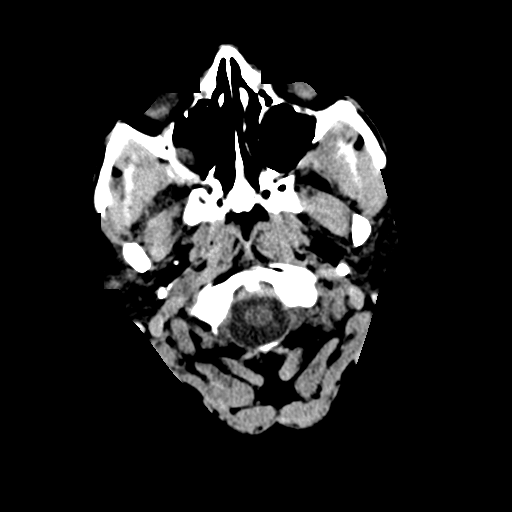
[im 7/36  brain]
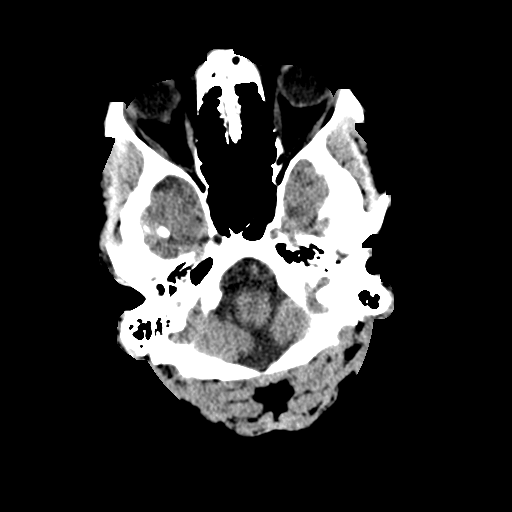
[im 9/36  brain]
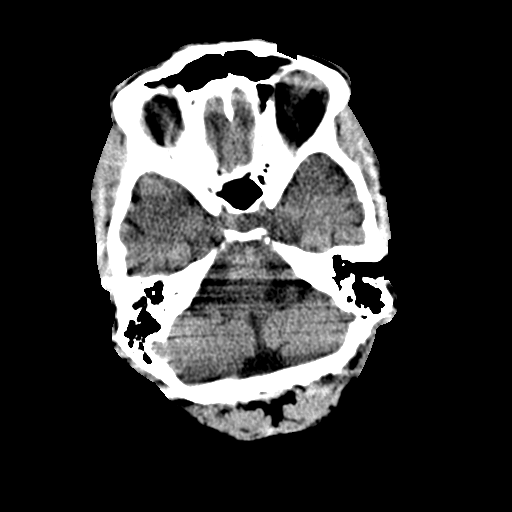
[im 11/36  brain]
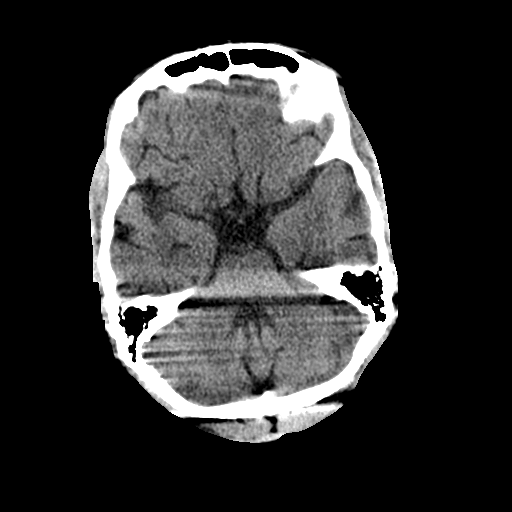
[im 11/36  bone]
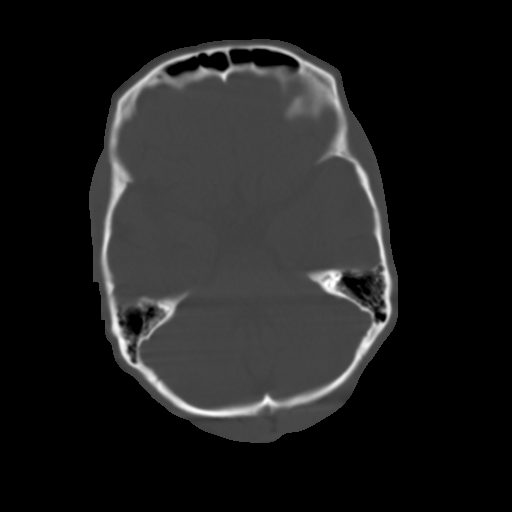
[im 12/36  brain]
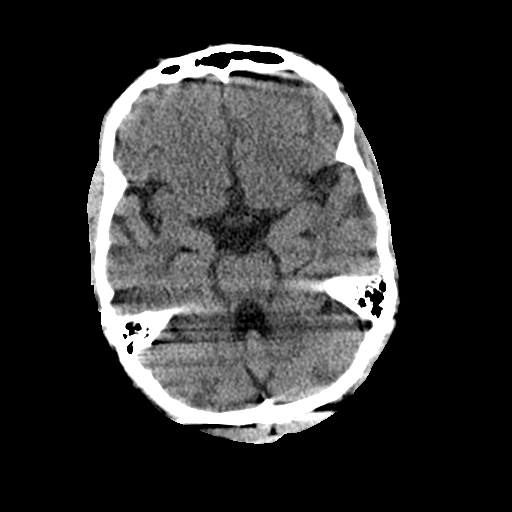
[im 16/36  brain]
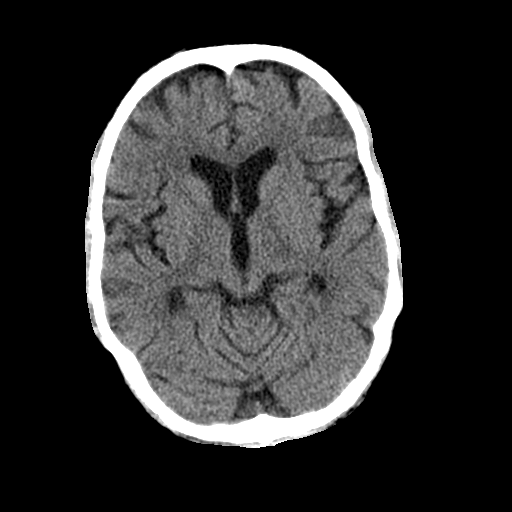
[im 17/36  brain]
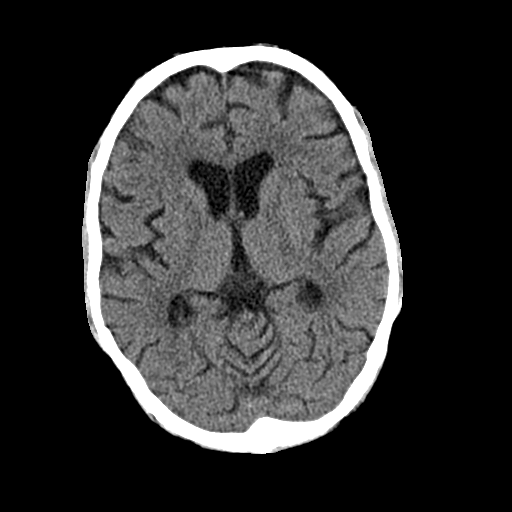
[im 19/36  brain]
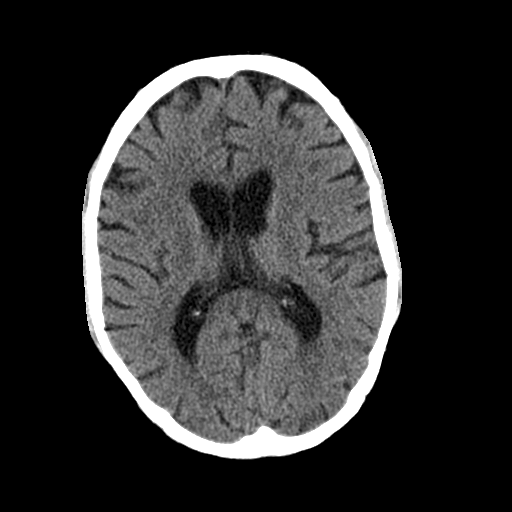
[im 19/36  bone]
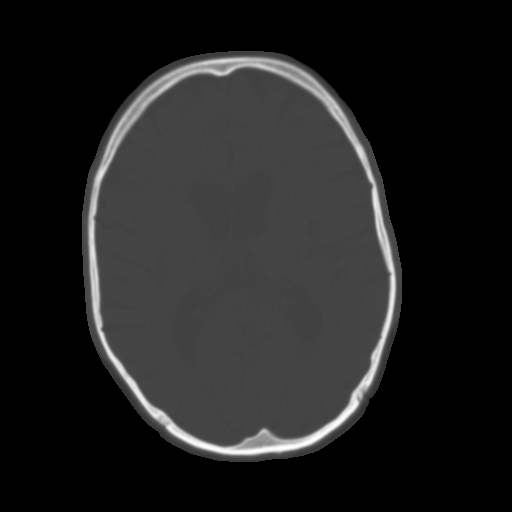
[im 21/36  brain]
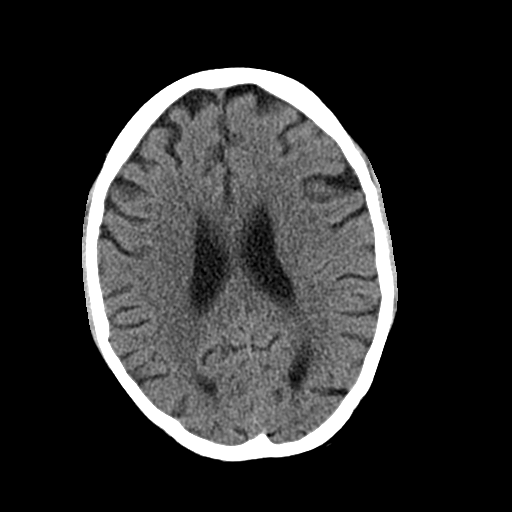
[im 24/36  brain]
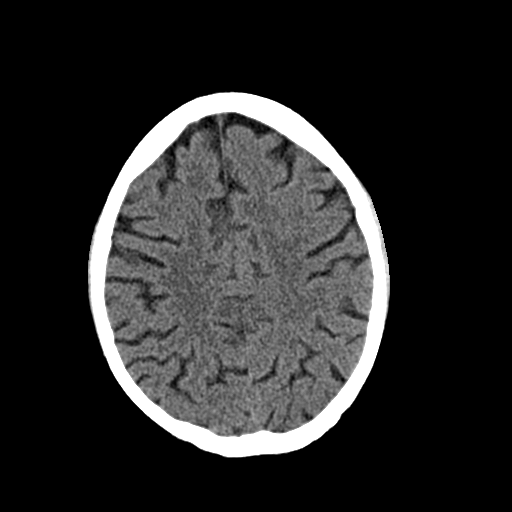
[im 26/36  brain]
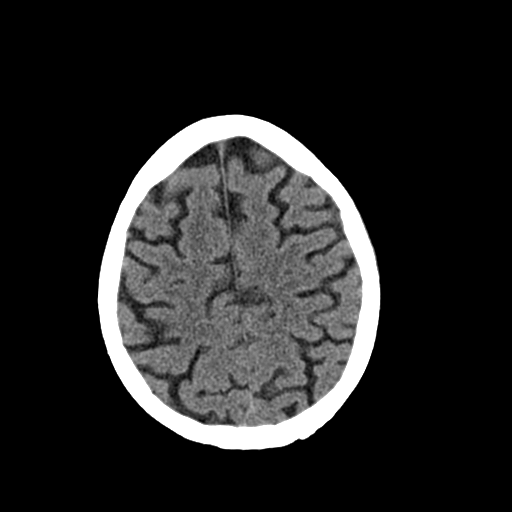
[im 27/36  brain]
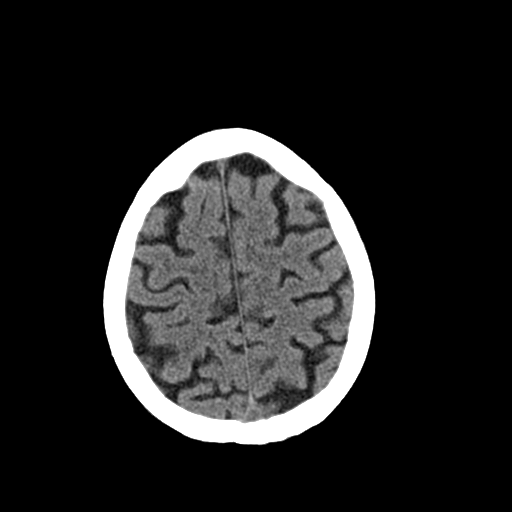
[im 27/36  bone]
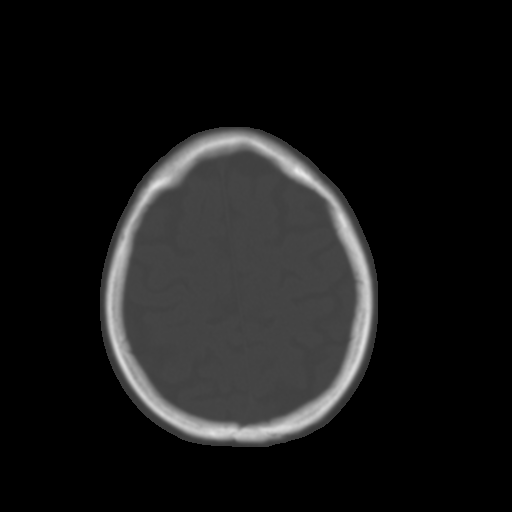
[im 29/36  brain]
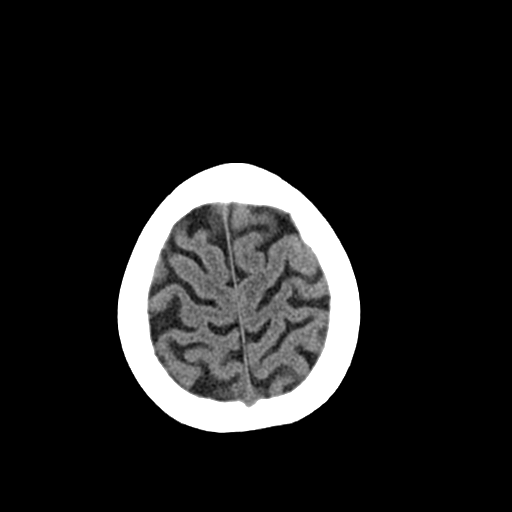
[im 32/36  brain]
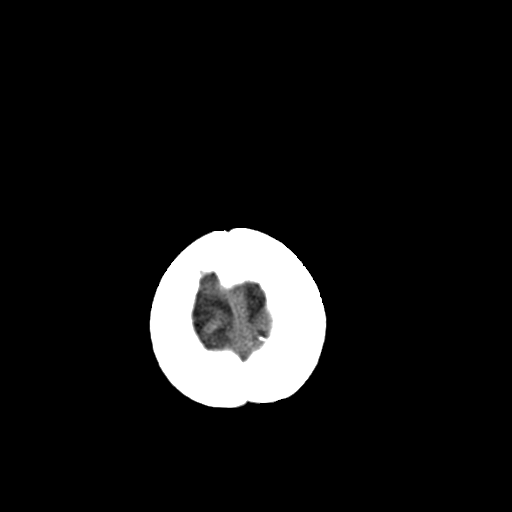
[im 34/36  brain]
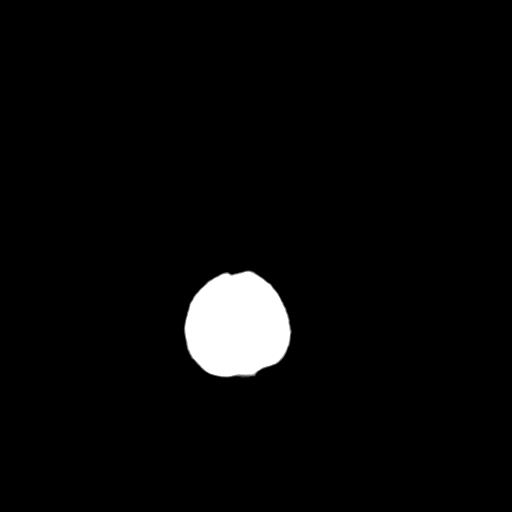

[16 of 30 positions shown; findings below may reference images not displayed]

FINDINGS: Bilateral maxillary sinus mucous retention cysts are noted. Mastoid
air cells are unremarkable.

No intracranial hemorrhage, mass effect or midline shift.

No acute cortical infarction. No mass lesion is noted on this
unenhanced scan. The gray and white-matter differentiation is
preserved. Mild cerebral atrophy. No skull fracture is noted.
IMPRESSION: No acute intracranial abnormality. Bilateral maxillary sinus mucous
retention cysts. Mild cerebral atrophy is noted.
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
## 2016-02-19 NOTE — Progress Notes (Signed)
This encounter was created in error - please disregard. This encounter was created in error - please disregard. This encounter was created in error - please disregard.
